# Patient Record
Sex: Male | Born: 1950 | Race: White | Hispanic: No | Marital: Married | Smoking: Never smoker
Health system: Southern US, Community
[De-identification: ages and names within clinical notes are randomized; demographics above are authoritative.]

## PROBLEM LIST (undated history)

## (undated) DIAGNOSIS — Z87442 Personal history of urinary calculi: Secondary | ICD-10-CM

## (undated) DIAGNOSIS — T7840XA Allergy, unspecified, initial encounter: Secondary | ICD-10-CM

## (undated) DIAGNOSIS — H269 Unspecified cataract: Secondary | ICD-10-CM

## (undated) DIAGNOSIS — K5730 Diverticulosis of large intestine without perforation or abscess without bleeding: Secondary | ICD-10-CM

## (undated) DIAGNOSIS — J309 Allergic rhinitis, unspecified: Secondary | ICD-10-CM

## (undated) DIAGNOSIS — R011 Cardiac murmur, unspecified: Secondary | ICD-10-CM

## (undated) DIAGNOSIS — M1990 Unspecified osteoarthritis, unspecified site: Secondary | ICD-10-CM

## (undated) DIAGNOSIS — I10 Essential (primary) hypertension: Secondary | ICD-10-CM

## (undated) DIAGNOSIS — B1920 Unspecified viral hepatitis C without hepatic coma: Secondary | ICD-10-CM

## (undated) DIAGNOSIS — Z9289 Personal history of other medical treatment: Secondary | ICD-10-CM

## (undated) HISTORY — DX: Diverticulosis of large intestine without perforation or abscess without bleeding: K57.30

## (undated) HISTORY — DX: Unspecified viral hepatitis C without hepatic coma: B19.20

## (undated) HISTORY — DX: Allergic rhinitis, unspecified: J30.9

## (undated) HISTORY — DX: Unspecified cataract: H26.9

## (undated) HISTORY — PX: ASD REPAIR: SHX258

## (undated) HISTORY — DX: Personal history of other medical treatment: Z92.89

## (undated) HISTORY — DX: Cardiac murmur, unspecified: R01.1

## (undated) HISTORY — DX: Essential (primary) hypertension: I10

## (undated) HISTORY — PX: POLYPECTOMY: SHX149

## (undated) HISTORY — PX: CARDIAC CATHETERIZATION: SHX172

## (undated) HISTORY — DX: Allergy, unspecified, initial encounter: T78.40XA

## (undated) HISTORY — PX: OTHER SURGICAL HISTORY: SHX169

---

## 1969-07-28 HISTORY — PX: ASD REPAIR: SHX258

## 2000-06-02 ENCOUNTER — Ambulatory Visit (HOSPITAL_COMMUNITY): Admission: RE | Admit: 2000-06-02 | Discharge: 2000-06-02 | Payer: Self-pay | Admitting: Gastroenterology

## 2000-06-02 ENCOUNTER — Encounter (INDEPENDENT_AMBULATORY_CARE_PROVIDER_SITE_OTHER): Payer: Self-pay

## 2002-07-16 ENCOUNTER — Encounter: Payer: Self-pay | Admitting: Emergency Medicine

## 2002-07-16 ENCOUNTER — Inpatient Hospital Stay (HOSPITAL_COMMUNITY): Admission: EM | Admit: 2002-07-16 | Discharge: 2002-07-20 | Payer: Self-pay | Admitting: Emergency Medicine

## 2002-07-29 ENCOUNTER — Inpatient Hospital Stay (HOSPITAL_COMMUNITY): Admission: AD | Admit: 2002-07-29 | Discharge: 2002-08-09 | Payer: Self-pay | Admitting: General Surgery

## 2002-07-30 ENCOUNTER — Encounter: Payer: Self-pay | Admitting: General Surgery

## 2002-08-03 ENCOUNTER — Encounter (INDEPENDENT_AMBULATORY_CARE_PROVIDER_SITE_OTHER): Payer: Self-pay | Admitting: *Deleted

## 2002-08-06 ENCOUNTER — Encounter: Payer: Self-pay | Admitting: General Surgery

## 2003-07-29 HISTORY — PX: HERNIA REPAIR: SHX51

## 2003-07-29 HISTORY — PX: ABDOMINAL HERNIA REPAIR: SHX539

## 2003-08-23 ENCOUNTER — Observation Stay (HOSPITAL_COMMUNITY): Admission: RE | Admit: 2003-08-23 | Discharge: 2003-08-24 | Payer: Self-pay | Admitting: General Surgery

## 2004-07-09 ENCOUNTER — Encounter: Admission: RE | Admit: 2004-07-09 | Discharge: 2004-07-09 | Payer: Self-pay | Admitting: General Surgery

## 2004-10-28 ENCOUNTER — Ambulatory Visit (HOSPITAL_COMMUNITY): Admission: RE | Admit: 2004-10-28 | Discharge: 2004-10-28 | Payer: Self-pay | Admitting: Gastroenterology

## 2004-10-28 ENCOUNTER — Encounter (INDEPENDENT_AMBULATORY_CARE_PROVIDER_SITE_OTHER): Payer: Self-pay | Admitting: *Deleted

## 2005-07-28 HISTORY — PX: COLON SURGERY: SHX602

## 2006-10-12 ENCOUNTER — Encounter: Payer: Self-pay | Admitting: Family Medicine

## 2007-03-04 ENCOUNTER — Ambulatory Visit: Payer: Self-pay | Admitting: Gastroenterology

## 2007-04-30 ENCOUNTER — Ambulatory Visit (HOSPITAL_COMMUNITY): Admission: RE | Admit: 2007-04-30 | Discharge: 2007-04-30 | Payer: Self-pay | Admitting: Gastroenterology

## 2007-04-30 ENCOUNTER — Encounter (INDEPENDENT_AMBULATORY_CARE_PROVIDER_SITE_OTHER): Payer: Self-pay | Admitting: Interventional Radiology

## 2008-10-23 ENCOUNTER — Ambulatory Visit: Payer: Self-pay | Admitting: Family Medicine

## 2008-10-23 DIAGNOSIS — I10 Essential (primary) hypertension: Secondary | ICD-10-CM

## 2008-10-23 DIAGNOSIS — J309 Allergic rhinitis, unspecified: Secondary | ICD-10-CM | POA: Insufficient documentation

## 2008-10-23 DIAGNOSIS — K5730 Diverticulosis of large intestine without perforation or abscess without bleeding: Secondary | ICD-10-CM | POA: Insufficient documentation

## 2008-10-23 DIAGNOSIS — Z860100 Personal history of colon polyps, unspecified: Secondary | ICD-10-CM | POA: Insufficient documentation

## 2008-10-23 HISTORY — DX: Essential (primary) hypertension: I10

## 2008-10-23 HISTORY — DX: Diverticulosis of large intestine without perforation or abscess without bleeding: K57.30

## 2008-10-23 HISTORY — DX: Allergic rhinitis, unspecified: J30.9

## 2008-11-20 ENCOUNTER — Ambulatory Visit: Payer: Self-pay | Admitting: Family Medicine

## 2009-01-23 ENCOUNTER — Ambulatory Visit: Payer: Self-pay | Admitting: Family Medicine

## 2009-12-11 ENCOUNTER — Telehealth: Payer: Self-pay | Admitting: Family Medicine

## 2009-12-13 ENCOUNTER — Ambulatory Visit: Payer: Self-pay | Admitting: Family Medicine

## 2010-01-07 ENCOUNTER — Ambulatory Visit: Payer: Self-pay | Admitting: Family Medicine

## 2010-01-07 DIAGNOSIS — L03119 Cellulitis of unspecified part of limb: Secondary | ICD-10-CM

## 2010-01-07 DIAGNOSIS — L02519 Cutaneous abscess of unspecified hand: Secondary | ICD-10-CM | POA: Insufficient documentation

## 2010-08-27 NOTE — Assessment & Plan Note (Signed)
Summary: med check//ccm   Vital Signs:  Patient profile:   60 year old male Temp:     97.7 degrees F oral BP sitting:   150 / 90  (left arm) Cuff size:   regular  Vitals Entered By: Sid Falcon LPN (Dec 13, 2009 8:30 AM)  Serial Vital Signs/Assessments:  Time      Position  BP       Pulse  Resp  Temp     By                     140/80                         Evelena Peat MD  CC: annual med review, Hypertension Management   History of Present Illness: Followup hypertension. Takes lisinopril HCTZ. Blood pressures have been well-controlled. No recent dizziness or orthostatic symptoms. No associated cough.  History hepatitis C followed at [facility]. He is apparently in remission. BPH followed by urologist. Leanora Ivanoff Flomax and symptoms stable. Needs complete physical examination.  Hypertension History:      He denies headache, chest pain, palpitations, dyspnea with exertion, orthopnea, PND, peripheral edema, visual symptoms, neurologic problems, syncope, and side effects from treatment.  He notes no problems with any antihypertensive medication side effects.        Positive major cardiovascular risk factors include male age 28 years old or older and hypertension.  Negative major cardiovascular risk factors include non-tobacco-user status.     Allergies (verified): No Known Drug Allergies  Past History:  Past Medical History: Last updated: 10/23/2008 Colonic polyps, hx of Diverticulosis, colon Hypertension Hay Fever/Allergies Heart Murmur hepatiits C  Past Surgical History: Last updated: 10/23/2008 Atrial Septal Defect repair 1971 Hernia repair 1973 Colon Resection 2007 (diverticulitis rupture) Colon resection hernia repair 2005  Family History: Last updated: 10/23/2008 Family History of Alcoholism/Addiction  parent Family History of Cardiovascular disorder  parent  Social History: Last updated: 10/23/2008 Occupation:  Surveyor, minerals Married Never Smoked  Risk  Factors: Smoking Status: never (10/23/2008) PMH-FH-SH reviewed for relevance  Review of Systems      See HPI  Physical Exam  General:  Well-developed,well-nourished,in no acute distress; alert,appropriate and cooperative throughout examination Mouth:  Oral mucosa and oropharynx without lesions or exudates.  Teeth in good repair. Lungs:  Normal respiratory effort, chest expands symmetrically. Lungs are clear to auscultation, no crackles or wheezes. Heart:  Normal rate and regular rhythm. S1 and S2 normal without gallop, murmur, click, rub or other extra sounds. Extremities:  No clubbing, cyanosis, edema, or deformity noted with normal full range of motion of all joints.     Impression & Recommendations:  Problem # 1:  HYPERTENSION (ICD-401.9) Assessment Unchanged  His updated medication list for this problem includes:    Lisinopril-hydrochlorothiazide 10-12.5 Mg Tabs (Lisinopril-hydrochlorothiazide) ..... One by mouth once daily  Complete Medication List: 1)  Flomax 0.4 Mg Xr24h-cap (Tamsulosin hcl) .... Once daily 2)  Fexofenadine Hcl 180 Mg Tabs (Fexofenadine hcl) .... Once daily as needed 3)  Lisinopril-hydrochlorothiazide 10-12.5 Mg Tabs (Lisinopril-hydrochlorothiazide) .... One by mouth once daily 4)  Fish Oil Oil (Fish oil) .... Once daily 5)  Multi Vitamin Mens Tabs (Multiple vitamin) .... Once daily  Hypertension Assessment/Plan:      The patient's hypertensive risk group is category B: At least one risk factor (excluding diabetes) with no target organ damage.  Today's blood pressure is 150/90.    Patient Instructions: 1)  Schedule complete physical examination Prescriptions: LISINOPRIL-HYDROCHLOROTHIAZIDE 10-12.5 MG TABS (LISINOPRIL-HYDROCHLOROTHIAZIDE) one by mouth once daily  #90 x 3   Entered and Authorized by:   Evelena Peat MD   Signed by:   Evelena Peat MD on 12/13/2009   Method used:   Electronically to        [facility]* (mail-order)              ,          Ph: [phone]       Fax: [phone]   RxID:   [id number]

## 2010-08-27 NOTE — Assessment & Plan Note (Signed)
Summary: ?hand inf/njr   Vital Signs:  Patient profile:   60 year old male Temp:     98.0 degrees F oral BP sitting:   130 / 82  (left arm) Cuff size:   regular  Vitals Entered By: Sid Falcon LPN (January 07, 2010 4:30 PM) CC: Left hand injury 6 days ago   History of Present Illness: Last Wed hit dorsum L hand with hard plastic from plant sprayer. Tetanus up to date. Redness noted this AM.  Progressive through the day. No fever of chills.  Increased swelling past couple of days.  Allergies (verified): No Known Drug Allergies  Past History:  Past Medical History: Last updated: 10/23/2008 Colonic polyps, hx of Diverticulosis, colon Hypertension Hay Fever/Allergies Heart Murmur hepatiits C  Review of Systems      See HPI  Physical Exam  General:  Well-developed,well-nourished,in no acute distress; alert,appropriate and cooperative throughout examination Lungs:  Normal respiratory effort, chest expands symmetrically. Lungs are clear to auscultation, no crackles or wheezes. Heart:  normal rate and regular rhythm.   Extremities:  L hand mod edema.  Abrasion dorsum of hand with surrounding erythema dorsum hand and extending for distal wrist.   Impression & Recommendations:  Problem # 1:  CELLULITIS, HAND, LEFT (ICD-682.4) Assessment New elevation, heating pad and start antibiotics. His updated medication list for this problem includes:    Cephalexin 500 Mg Caps (Cephalexin) ..... One by mouth three times a day for 10 days  Complete Medication List: 1)  Flomax 0.4 Mg Xr24h-cap (Tamsulosin hcl) .... Once daily 2)  Fexofenadine Hcl 180 Mg Tabs (Fexofenadine hcl) .... Once daily as needed 3)  Lisinopril-hydrochlorothiazide 10-12.5 Mg Tabs (Lisinopril-hydrochlorothiazide) .... One by mouth once daily 4)  Fish Oil Oil (Fish oil) .... Once daily 5)  Multi Vitamin Mens Tabs (Multiple vitamin) .... Once daily 6)  Cephalexin 500 Mg Caps (Cephalexin) .... One by mouth three  times a day for 10 days  Patient Instructions: 1)  Elevate hand frequently. 2)  use heating pad 3-4 times daily. 3)  Follow up promptly for any fever or increasing redness or swelling. Prescriptions: CEPHALEXIN 500 MG CAPS (CEPHALEXIN) one by mouth three times a day for 10 days  #30 x 0   Entered and Authorized by:   Evelena Peat MD   Signed by:   Evelena Peat MD on 01/07/2010   Method used:   Electronically to        [facility] [phone]* (retail)       [address]       [place], [place]  [address]       Ph: [phone]       Fax: [phone]   RxID:   [id number]

## 2010-08-27 NOTE — Progress Notes (Signed)
Summary: REFILL REQUEST (Lisinopril / HCTZ)  Phone Note Refill Request Message from:  Fax from Pharmacy on Dec 11, 2009 8:32 AM  Refills Requested: Medication #1:  LISINOPRIL-HYDROCHLOROTHIAZIDE 10-12.5 MG TABS one by mouth once daily   Notes: [facility]..... Quantity 90, Refills 3.....Marland KitchenMarland KitchenFax # [phone]    Initial call taken by: Debbra Riding,  Dec 11, 2009 8:33 AM    Prescriptions: LISINOPRIL-HYDROCHLOROTHIAZIDE 10-12.5 MG TABS (LISINOPRIL-HYDROCHLOROTHIAZIDE) one by mouth once daily  #90 x 0   Entered by:   Sid Falcon LPN   Authorized by:   Evelena Peat MD   Signed by:   Sid Falcon LPN on 47/82/9562   Method used:   Electronically to        [facility]* (mail-order)             ,          Ph: [phone]       Fax: [phone]   RxID:   [id number]

## 2010-10-30 ENCOUNTER — Other Ambulatory Visit (INDEPENDENT_AMBULATORY_CARE_PROVIDER_SITE_OTHER): Payer: Managed Care, Other (non HMO) | Admitting: Family Medicine

## 2010-10-30 DIAGNOSIS — Z0000 Encounter for general adult medical examination without abnormal findings: Secondary | ICD-10-CM

## 2010-10-30 LAB — BASIC METABOLIC PANEL
CO2: 28 mEq/L (ref 19–32)
Calcium: 9.8 mg/dL (ref 8.4–10.5)
Chloride: 103 mEq/L (ref 96–112)
GFR: 65.5 mL/min (ref >60.00)
Potassium: 3.9 mEq/L (ref 3.5–5.1)
Sodium: 141 mEq/L (ref 135–145)

## 2010-10-30 LAB — CBC WITH DIFFERENTIAL/PLATELET
Basophils Relative: 0.5 % (ref 0.0–3.0)
Eosinophils Absolute: 0.2 10*3/uL (ref 0.0–0.7)
Eosinophils Relative: 3.6 % (ref 0.0–5.0)
Hemoglobin: 16.6 g/dL (ref 13.0–17.0)
Lymphs Abs: 1.2 10*3/uL (ref 0.7–4.0)
MCV: 87.8 fl (ref 78.0–100.0)
Monocytes Absolute: 0.5 10*3/uL (ref 0.1–1.0)
Monocytes Relative: 8.6 % (ref 3.0–12.0)
Neutro Abs: 3.5 10*3/uL (ref 1.4–7.7)
RDW: 14.4 % (ref 11.5–14.6)

## 2010-10-30 LAB — POCT URINALYSIS DIPSTICK
Ketones, UA: NEGATIVE
Leukocytes, UA: NEGATIVE
Spec Grav, UA: 1.025
Urobilinogen, UA: 0.2

## 2010-10-30 LAB — LIPID PANEL
LDL Cholesterol: 84 mg/dL (ref 0–99)
Total CHOL/HDL Ratio: 5
Triglycerides: 77 mg/dL (ref 0.0–149.0)

## 2010-10-30 LAB — TSH: TSH: 1.91 u[IU]/mL (ref 0.35–5.50)

## 2010-10-30 LAB — HEPATIC FUNCTION PANEL
ALT: 18 U/L (ref 0–53)
AST: 15 U/L (ref 0–37)
Albumin: 4.6 g/dL (ref 3.5–5.2)
Bilirubin, Direct: 0.2 mg/dL (ref 0.0–0.3)
Total Bilirubin: 0.9 mg/dL (ref 0.3–1.2)

## 2010-11-05 ENCOUNTER — Encounter: Payer: Self-pay | Admitting: Family Medicine

## 2010-11-06 ENCOUNTER — Ambulatory Visit (INDEPENDENT_AMBULATORY_CARE_PROVIDER_SITE_OTHER): Payer: Managed Care, Other (non HMO) | Admitting: Family Medicine

## 2010-11-06 ENCOUNTER — Encounter: Payer: Self-pay | Admitting: Family Medicine

## 2010-11-06 VITALS — BP 140/84 | HR 72 | Temp 97.7°F | Resp 12 | Ht 72.0 in | Wt 194.0 lb

## 2010-11-06 DIAGNOSIS — Z0000 Encounter for general adult medical examination without abnormal findings: Secondary | ICD-10-CM

## 2010-11-06 DIAGNOSIS — B1920 Unspecified viral hepatitis C without hepatic coma: Secondary | ICD-10-CM | POA: Insufficient documentation

## 2010-11-06 DIAGNOSIS — J309 Allergic rhinitis, unspecified: Secondary | ICD-10-CM

## 2010-11-06 MED ORDER — FLUTICASONE PROPIONATE 50 MCG/ACT NA SUSP: 2.0000 | Freq: Every day | NASAL | Status: DC

## 2010-11-06 NOTE — Progress Notes (Signed)
  Subjective:    Patient ID: Jermaine Brady, male    DOB: 02-07-1951, 60 y.o.   MRN: [id number]  HPI Here for annual physical exam.  He has history of hypertension, seasonal allergic rhinitis, diverticulosis of the colon, BPH, and prior history hepatitis C. Was briefly followed hepatitis C clinic. No recent signs or symptoms of hepatic dysfunction.  Tetanus 2006. Colonoscopy 2006 normal.   Review of Systems  Constitutional: Negative for fever, activity change, appetite change and fatigue.  HENT: Negative for ear pain, congestion and trouble swallowing.   Eyes: Negative for pain and visual disturbance.  Respiratory: Negative for cough, shortness of breath and wheezing.   Cardiovascular: Negative for chest pain and palpitations.  Gastrointestinal: Negative for nausea, vomiting, abdominal pain, diarrhea, constipation, blood in stool, abdominal distention and rectal pain.  Genitourinary: Negative for dysuria, hematuria and testicular pain.  Musculoskeletal: Negative for joint swelling and arthralgias.  Skin: Negative for rash.  Neurological: Negative for dizziness, syncope and headaches.  Hematological: Negative for adenopathy.  Psychiatric/Behavioral: Negative for confusion and dysphoric mood.       Objective:   Physical Exam  Constitutional: He is oriented to person, place, and time. He appears well-developed and well-nourished. No distress.  HENT:  Head: Normocephalic and atraumatic.  Right Ear: External ear normal.  Left Ear: External ear normal.  Mouth/Throat: Oropharynx is clear and moist.  Eyes: Conjunctivae and EOM are normal. Pupils are equal, round, and reactive to light.  Neck: Normal range of motion. Neck supple. No thyromegaly present.  Cardiovascular: Normal rate, regular rhythm and normal heart sounds.   No murmur heard. Pulmonary/Chest: No respiratory distress. He has no wheezes. He has no rales.  Abdominal: Soft. Bowel sounds are normal. He exhibits no distension  and no mass. There is no tenderness. There is no rebound and no guarding.  Genitourinary:       Per urology and due for appointment in one month  Musculoskeletal: He exhibits no edema.  Lymphadenopathy:    He has no cervical adenopathy.  Neurological: He is alert and oriented to person, place, and time. He displays normal reflexes. No cranial nerve deficit.  Skin: No rash noted.  Psychiatric: He has a normal mood and affect.          Assessment & Plan:  Complete physical. Immunizations up to date. Hemoccults given. Discussed exercise and appropriate diet.  Labs reviewed with patient and all stable with exception of low HDL. Liver transaminases stable. Forms completed for Department of Transportation exam.

## 2010-12-10 ENCOUNTER — Ambulatory Visit: Payer: Self-pay | Admitting: Family Medicine

## 2010-12-13 NOTE — Op Note (Signed)
NAME:  Jermaine Brady, Jermaine Brady NO.:  [id number]   MEDICAL RECORD NO.:  [id number]                   PATIENT TYPE:  AMB   LOCATION:  DAY                                  FACILITY:  [facility]   PHYSICIAN:  [name], M.D.          DATE OF BIRTH:  02/12/1951   DATE OF PROCEDURE:  08/23/2003  DATE OF DISCHARGE:                                 OPERATIVE REPORT   PREOPERATIVE DIAGNOSIS:  Incisional hernia.   POSTOPERATIVE DIAGNOSIS:  Incisional hernia.   OPERATION/PROCEDURE:  Laparoscopic repair of incisional hernia.   SURGEON:  [name], M.D.   ANESTHESIA:  General.   BRIEF HISTORY:  [name] is a 60 year old white male, status post sigmoid  colectomy approximately one year ago for perforated diverticulitis.  He now  presents with an enlarging symptomatic incisional hernia presenting at the  umbilicus.  Options for management have been discussed.  We have elected to  proceed with laparoscopic repair.  The nature of the procedure, indications,  risks of bleeding, infection, recurrence, and possible need for open  procedure were discussed and understood.  He is now brought to the operating  room for this procedure.   DESCRIPTION OF PROCEDURE:  The patient was brought to the operating room and  placed in the supine position on the operating table.  General endotracheal  anesthesia was induced.  Foley catheter was placed.  The abdomen was widely  sterilely prepped and draped.  Local anesthesia was used to infiltrate the  trocar sites prior to the incisions.  A 1 cm incision was made in the right  mid abdomen and access was obtained with an 11 mm Optiview trocar without  difficulty.  Pneumoperitoneum established.  Under direct vision, a 5 mm  trocar was replaced in the right upper quadrant and right lower quadrant.  There were fairly extensive omental adhesions to the anterior abdominal wall  and midline incision.  They were all carefully  taken down sharply and with  Harmonic scalpel under direct vision.  There were no significant bowel  adhesions.  The entire anterior abdominal wall was clear.  At this point a  fairly discrete defect was able to be defined around the umbilicus at the  incision measuring about 3 cm in maximum diameters.  To allow wide coverage  and further coverage of the midline incision more inferiorly, although there  were no discrete hernias here, a 10 x 15 cm piece of Parietex composite mesh  was used.  Four sutures were placed, stay sutures with 0 Prolene.  The mesh  was moistened and coiled and placed in the abdominal cavity and oriented  through four corresponding small stab incisions.  The sutures were grasped  with an __________ and brought up through the anterior abdominal wall and  secured, provided nice broad coverage of the hernia defect and mesh was well  deployed.  An additional 5 mm trocar was placed in the  left mid abdomen to  allow a good angle for placement of tacks and the mesh was then tacked  circumferentially with the Protacker.  Operative site was inspected.  Hemostasis appeared complete.  All CO2 was evacuated and trocars were  removed.  Skin incisions were closed with interrupted subcuticular 4-0  Monocryl and Steri-Strips.  Sponge, needle and instrument counts were  correct.  Dry dressing was applied and the patient taken to the recovery  room in good condition.                                              [name], M.D.   [name]  D:  08/23/2003  T:  08/23/2003  Job:  [id number]

## 2010-12-13 NOTE — Discharge Summary (Signed)
NAME:  DECKER, COGDELL NO.:  [id number]   MEDICAL RECORD NO.:  [id number]                   PATIENT TYPE:  INP   LOCATION:  5509                                 FACILITY:  [facility]   PHYSICIAN:  [name], M.D.          DATE OF BIRTH:  [birth year]   DATE OF ADMISSION:  07/29/2002  DATE OF DISCHARGE:  08/09/2002                                 DISCHARGE SUMMARY   DISCHARGE DIAGNOSIS:  Perforated diverticulitis with abscess.   SURGICAL PROCEDURE:  Sigmoid colectomy with anastomosis on August 03, 2002.   HISTORY OF PRESENT ILLNESS:  The patient is a 60 year old white male who was  discharged on July 20, 2002, from [facility] after a 5 day  hospitalization for acute left lower quadrant abdominal pain and a CT scan  revealing sigmoid diverticulitis and also apparently a tiny amount of free  air in the peritoneal cavity as well. He was  treated with bowel rest,  intravenous antibiotics and clinically markedly improved and his white count  returned to normal. He had been home on p.o. Augmentin for 2 to 3 days prior  to  this admission. He has had increased sweats and fever at night up to  100.5. He has had some persistent mild left lower quadrant discomfort that  has gotten a little bit worse. His bowels have been moving regularly. No  nausea or vomiting. He discussed this with Dr. Orson Slick on July 27, 2002,  and a CBC was obtained as an outpatient which showed increased white count  to 18,000, and he presented to see me in the office today for evaluation.   PAST MEDICAL HISTORY:  1. He had an atrial septal defect repaired 20 years ago.  2. He has had a left inguinal hernia repair .  3. Otherwise no significant medical or surgical illnesses.   MEDICATIONS:  Augmentin.   SOCIAL HISTORY, FAMILY HISTORY, REVIEW OF SYSTEMS:  See history and  physical.   PHYSICAL EXAMINATION:  VITAL SIGNS:  Afebrile, vital signs all within normal  limits.  GENERAL:  A well developed white male in no acute distress.  ABDOMEN:  Pertinent findings were limited to the abdomen which was generally  soft and nontender but there is some localized significant tenderness in the  left lower quadrant and a good sized palpable mass as well.   HOSPITAL COURSE:  With these findings, the patient was admitted to [facility] for further treatment. The patient was begun on IV antibiotics  and bowel rest. On July 30, 2002, his white count was decreased to 8.9  thousand. A CT scan of the abdomen was obtained on July 30, 2002, which  revealed a large gas collection with some fluid adjacent to the sigmoid  colon against the anterior abdominal wall consistent with a contained large  perforation. The gas collection could be traced into the sigmoid colon  through an apparent large defect  in the bowel wall.   With this apparent good sized perforation and localized gas collection or  abscess, resection was recommended and accepted. The patient underwent a  gentle mechanical bowel prep over the next couple of days which he tolerated  well. He was taken to the operating room on August 03, 2002, and underwent a  sigmoid colectomy with primary anastomosis. Findings were a limited area of  severe diverticulitis with a walled off perforation.   He was maintained on antibiotics postoperatively. He was begun on a clear  liquid diet on postoperative day #1. He developed some urinary retention  requiring placement of a Foley catheter. He had an episode of fairly severe  wrist pain that lasted less than 1 day without any physical findings and  this did not recur.   By postoperative day #4, his abdominal pain was better but he did have some  nausea and vomited twice. This was felt secondary to ileus and he was  maintained on just sips of liquids.   By postoperative day #5 he was  feeling significantly better. His nausea had  resolved. He had begun  passing some flatus. His Foley catheter was  discontinued at this point and he was  able to void. His diet was able to be  advanced.   By postoperative day #6 he was  tolerating full liquids without difficulty  and had several small bowel movements. His wound healed primarily and  staples were removed. He was discharged to home at this time. Final  pathology revealed acute diverticulitis with evidence of rupture.   DISCHARGE MEDICATIONS:  1. Tylox for pain.  2. Oral antibiotics to follow up another 5 days.                                                 [name], M.D.    [name]  D:  08/30/2002  T:  08/31/2002  Job:  [id number]

## 2010-12-13 NOTE — H&P (Signed)
NAME:  Jermaine Brady NO.:  [id number]   MEDICAL RECORD NO.:  [id number]                   PATIENT TYPE:  INP   LOCATION:  5509                                 FACILITY:  [facility]   PHYSICIAN:  [name], M.D.          DATE OF BIRTH:  [birth year]   DATE OF ADMISSION:  07/29/2002  DATE OF DISCHARGE:                                HISTORY & PHYSICAL   CHIEF COMPLAINT:  Abdominal pain and fever.   HISTORY OF PRESENT ILLNESS:  Jermaine Brady is a 60 year old white male,  discharged on 07/20/2002 from [facility] after a 5 day  hospitalization for acute left lower quadrant abdominal pain.  CT scan at  that admission revealed evidence of sigmoid diverticulitis and apparently a  small amount of free air in the peritoneal cavity as well.  He was treated  with bowel rest and intravenous antibiotics, and clinically markedly  improved.  The white count returned to normal.  He has been at home on  Augmentin.  Over the past several days he has had sweats and fever at night  up to 100.5.  He has had some persistent mild, left lower quadrant  discomfort that may be a little worse over the last couple of days.  Bowels  have been moving regularly.  No nausea or vomiting.  He discussed this with  Dr. Orson Slick on 07/27/2002 and a CBC was obtained which has returned showing  an elevated white count of 18,000 and he presented to the office today for  evaluation.   PAST MEDICAL HISTORY:  He had an atrial septal defect repaired about 20  years ago.  He has had a left inguinal hernia repair.  Otherwise, no  significant illnesses, except as above.   MEDICATIONS:  Only current medication is Augmentin.   SOCIAL HISTORY:  He is a Corporate investment banker.  He is married.  He does not  smoke cigarettes or drink alcohol.   PHYSICAL EXAMINATION:  VITAL SIGNS:  Temperature in the office today is  97.8.  Vital signs all within normal limits.  GENERAL:  He is a  well-developed, white male, who does not appear in any  acute distress.  SKIN:  Warm and dry.  HEENT:  No scleral icterus.  LUNGS:  Clear to auscultation.  CARDIAC:  Regular rate and rhythm without murmurs.  ABDOMEN:  Active bowel sounds.  Abdomen is generally soft and nontender, but  there is localized tenderness in the left lower quadrant and also a good  sized palpable mass.  Some guarding.  EXTREMITIES:  No edema or deformity.   LABORATORY DATA:  White count elevated at 18,000 as above.   ASSESSMENT AND PLAN:  Recent episode of sigmoid diverticulitis with evidence  of perforation.  He initially responded well to antibiotics, but has  increasing discomfort and fever as an outpatient, and now on exam has a  palpable mass in the left lower  quadrant and significantly elevated white  blood count.  I am going to admit the patient for bowel rest and intravenous  antibiotics.  Will obtain an early CT scan of the abdomen and pelvis, with  suspicion of possible abdominal abscess.                                               [name], M.D.    [name]  D:  07/29/2002  T:  07/29/2002  Job:  [id number]

## 2010-12-13 NOTE — Discharge Summary (Signed)
   NAME:  Jermaine Brady, FITZPATRICK NO.:  [id number]   MEDICAL RECORD NO.:  [id number]                   PATIENT TYPE:  INP   LOCATION:  0443                                 FACILITY:  [facility]   PHYSICIAN:  Lorre Munroe., M.D.            DATE OF BIRTH:  Nov 02, 1950   DATE OF ADMISSION:  07/16/2002  DATE OF DISCHARGE:  07/20/2002                                 DISCHARGE SUMMARY   HISTORY OF PRESENT ILLNESS:  The patient is a 60 year old white male who was  admitted to the hospital because of left-sided abdominal pain.  CT scan  demonstrated findings consistent with acute diverticulitis of the proximal  sigmoid colon.  A small pneumoperitoneum was noted, but the inflammation  appeared to be well localized in the left lower quadrant.  There is no  previous history of diverticulitis or other gastrointestinal problems.  His  general health is excellent.   PHYSICAL EXAMINATION:  There was marked tenderness in the left lower  quadrant of the abdomen with mild diffuse abdominal tenderness.   HOSPITAL COURSE:  The patient was started on broad-spectrum intravenous  antibiotics, and they were continued throughout the course of the  hospitalization.  He rapidly became afebrile and felt better.  His white  count gradually improved to normal.  His abdominal tenderness decreased.  On  July 20, 2002, he was afebrile, and white count was 7300.  I discharged  him on Augmentin 875 mg three times a day.  He is to follow up in the office  with me in two to three weeks or if he gets worse.   DIAGNOSIS:  Acute diverticulitis.   CONDITION ON DISCHARGE:  Improved.                                                Lorre Munroe., M.D.    WB/MEDQ  D:  08/09/2002  T:  08/09/2002  Job:  [id number]

## 2010-12-13 NOTE — Op Note (Signed)
NAME:  Jermaine Brady, Jermaine Brady NO.:  [id number]   MEDICAL RECORD NO.:  [id number]          PATIENT TYPE:  AMB   LOCATION:  ENDO                         FACILITY:  [facility]   PHYSICIAN:  Bernette Redbird, M.D.   DATE OF BIRTH:  10/15/1950   DATE OF PROCEDURE:  10/28/2004  DATE OF DISCHARGE:                                 OPERATIVE REPORT   PROCEDURE:  Colonoscopy with biopsies.   ENDOSCOPIST:  Bernette Redbird, M.D.   INDICATION:  This is a 60 year old gentleman for his initial colon cancer  screening examination.  He has no worrisome risk factors or symptoms.  He  was set up as an open access patient.  He is status post a previous partial  colectomy for complicated diverticulitis.   FINDINGS:  Diminutive proximal colonic polyp with mild residual  diverticulosis.   DESCRIPTION OF PROCEDURE:  The nature, purpose, and risks of the procedure  had been discussed with the patient at the office by our nurse, and he had  watched our video tape, and I also reviewed the risks with him immediately  prior to the procedure, and he was agreeable to proceed.  Sedation was  fentanyl 75 mcg and Versed 6 mg IV without arrhythmias or desaturation.  Examination of the prostate was unremarkable.  The Olympus adult video  colonoscope was advanced with some looping overcome by external abdominal  compression, reaching the terminal ileum which had a normal appearance, and  pullback was then performed. Just above the cecum was a 3-mm sessile polyp  biopsied several times.  No other polyps were seen, and there was no  evidence of cancer, colitis, or vascular malformations.  There was mild  residual diverticulosis. The patient's colocolostomy anastomosis was located  at about 20 cm and was widely patent. Retroflexion in the rectum and  reinspection of the rectum were unremarkable.  The quality of prep on this  exam was very good, and it is felt that all areas were well seen   The patient tolerated  the procedure well, and there no apparent  complications.   IMPRESSION:  1.  Solitary diminutive polyp removed as described above (211.3).  2.  Mild residual diverticulosis.   PLAN:  Await pathology on the polyp with consideration for repeat  colonoscopy in five years if the polyps is adenomatous in character.      RB/MEDQ  D:  10/28/2004  T:  10/28/2004  Job:  [id number]   cc:   Teena Irani. Arlyce Dice, M.D.  [id number]  [place]  [place] [address]  Fax: [phone]

## 2010-12-13 NOTE — Op Note (Signed)
NAME:  Jermaine Brady, Jermaine Brady NO.:  [id number]   MEDICAL RECORD NO.:  [id number]                   PATIENT TYPE:  INP   LOCATION:  5509                                 FACILITY:  [facility]   PHYSICIAN:  [name], M.D.          DATE OF BIRTH:  [birth year]   DATE OF PROCEDURE:  08/03/2002  DATE OF DISCHARGE:                                 OPERATIVE REPORT   PREOPERATIVE DIAGNOSIS:  Sigmoid colon diverticulitis with contained  perforation.   POSTOPERATIVE DIAGNOSIS:  Sigmoid colon diverticulitis with contained  perforation.   OPERATION PERFORMED:  Sigmoid colectomy.   SURGEON:  [name], M.D.   ASSISTANT:  Vikki Ports, M.D.   ANESTHESIA:  General.   INDICATIONS FOR PROCEDURE:  The patient is a 60 year old white male who  recently presented with acute diverticulitis that initially improved on  antibiotic therapy.  He however, returned with some increasing discomfort  and CT scan has revealed a contained perforation of the midsigmoid colon  with a large gas collection walled off against the anterior abdominal wall.  We have been able to treat him with intravenous antibiotics and gentle bowel  prep without difficulty and he is now being brought to the operating room  for resection of this area.  The nature of the procedure, its indications  and risks of bleeding, infection, anastomotic leak were discussed and  understood.  He is now brought to the operating room for this procedure.   DESCRIPTION OF PROCEDURE:  The patient was brought to the operating room and  placed in supine position on the operating table and general endotracheal  anesthesia was induced.  He was already on broad spectrum antibiotics.  A  Foley catheter was inserted.  The abdomen was widely sterilely prepped and  draped.  A low midline incision skirting the umbilicus was used and  dissection carried to the subcutaneous tissue in midline fashion using  cautery and the peritoneum entered under direct vision.  There was a good  size inflammatory mass adherent to the anterior abdominal wall in the left  lower quadrant.  With careful blunt dissection, this was dissected off the  anterior abdominal wall and a large air-filled cavity with some purulent  material was entered.  This was cultured and suctioned.  This was further  bluntly dissected off the anterior and lateral pelvic wall until the sigmoid  colon was mobilized away from its inflammatory attachments.  There was a  good sized cavity formed by the anterior abdominal wall and omentum and  sigmoid colon.  The inflammatory omental attachments to the colon were  divided between clamps and tied with 2-0 silk ties completely immobilizing  the omentum.  The colon proximally and distally was carefully inspected and  appeared normal without marked thickening or inflammatory change.  This was  a segment fairly well localized in the mid to proximal sigmoid colon.  The  left colon was  mobilized dividing the peritoneum along the line of Toldt  laterally.  The distal sigmoid colon was also mobilized dividing lateral  peritoneal attachments.  The ureter was identified and carefully protected.  Points for proximal and distal resection were chosen in normal-appearing  bowel that would allow anastomosis with no tension.  I did not see any  evidence of thickening or diverticulosis beyond this area.  The mesentery  and pericolic fat at the proximal resection site was cleaned from the bowel  and this was divided between a Kocher and Allen clamp.  The mesentery of the  involved segment was then sequentially divided between clamps and tied with  2-0 silk ties, suturing ligating larger vessels.  The dissection progressed  distally to the proposed distal point of resection which was cleaned of  appendices epiploica, pericolic fat and mesentery and this was controlled  with 2-0 silk stay sutures and then  divided and the specimen removed.  The  bowel came together very nicely under no tension and appeared quite healthy.  An end-to-end anastomosis was created with full thickness interrupted 2-0  silk sutures.  This appeared widely patent and under no tension with good  blood supply.  Following this, gloves, instruments and lap pads were  changed.  A routine exploration of the remainder of the abdomen was  unremarkable.  The abdomen was thoroughly irrigated with warm saline and  hemostasis assured.  The viscera were returned to anatomic position.  The  midline fascia was closed with running #1 PDS beginning at either end of the  incision and tied centrally.  The subcutaneous tissue was irrigated with  antibiotic solution and the skin closed with staples.  Sponge, needle and  instrument counts were correct.  Dry sterile dressing was applied and the  patient taken to recovery in good position.                                                [name], M.D.    [name]  D:  08/03/2002  T:  08/03/2002  Job:  [id number]

## 2010-12-13 NOTE — H&P (Signed)
NAME:  Jermaine Brady, Jermaine Brady NO.:  [id number]   MEDICAL RECORD NO.:  [id number]                   PATIENT TYPE:  INP   LOCATION:  0443                                 FACILITY:  [facility]   PHYSICIAN:  Lorre Munroe., M.D.            DATE OF BIRTH:  1951/03/07   DATE OF ADMISSION:  07/16/2002  DATE OF DISCHARGE:                                HISTORY & PHYSICAL   CHIEF COMPLAINT:  Abdominal pain.   HISTORY OF PRESENT ILLNESS:  The patient is a 60 year old white male without  any history of GI problems who comes in with left sided abdominal pain. He  has undergone a CT scan which demonstrates findings of acute diverticulitis  of the proximal sigmoid colon. There is pneumoperitoneum noted. There was no  free fluid noted. The patient had vomited. He had felt fevers and had a  chill. The pain had been increasing but was localized to the left side of  the abdomen with occasional crampy pain as well. He is admitted to the  hospital for intravenous antibiotics and supportive care with a diagnosis of  acute diverticulitis.   PAST MEDICAL HISTORY:  He has no history of diverticular disease or any  other colon disease in the past. He is on no medications and has no known  medication allergies. He is a smoker. He does not drink alcoholic beverages.  He has hepatitis C which may have come from blood transfusion or priming of  the cardiac bypass pump for his previous surgery. He had an ASD repair in  the 1980s. He has had a hernia repair. No other major illnesses or  operations. The patient is active and works daily.   FAMILY HISTORY:  Childhood illnesses and review of systems are unremarkable.   PHYSICAL EXAMINATION:  GENERAL:  The patient is a generally healthy  appearing man. No acute distress and mental status normal.   VITAL SIGNS:  As recorded by nursing staff.   HEAD/NECK/EYES/EARS/NOSE/MOUTH/THROAT:  Unremarkable.   CHEST:  Clear to auscultation.  Median  sternotomy present.   HEART:  Rate and rhythm normal, no murmur or gallop.   ABDOMEN:  Marked left lower quadrant tenderness and rebound tenderness. The  remainder of the abdomen is soft and there is no significant tenderness.  Bowel sounds are active and normal.   GENITALIA:  Normal. Rectal not performed at this time.   EXTREMITIES:  Normal.   SKIN:  No lesions noted.   LYMPH NODES:  Not enlarged.   NEUROLOGIC:  Normal.   IMPRESSION:  Acute diverticulitis with pneumoperitoneum but localized  peritoneal findings.   RECOMMENDATIONS:  I am going to treat him medically and observe him very  carefully. He is aware that he eventually may have to have emergency surgery  with colostomy but we will try to avoid this. I will put him on IV Zosyn.  Lorre Munroe., M.D.    WB/MEDQ  D:  07/18/2002  T:  07/18/2002  Job:  [id number]

## 2011-03-17 ENCOUNTER — Other Ambulatory Visit: Payer: Self-pay | Admitting: Family Medicine

## 2011-05-08 LAB — CBC
MCHC: 34.4
Platelets: 133 — ABNORMAL LOW
RBC: 5.55

## 2011-05-08 LAB — PROTIME-INR: Prothrombin Time: 15.7 — ABNORMAL HIGH

## 2011-10-14 ENCOUNTER — Ambulatory Visit (INDEPENDENT_AMBULATORY_CARE_PROVIDER_SITE_OTHER): Payer: Managed Care, Other (non HMO) | Admitting: Family Medicine

## 2011-10-14 ENCOUNTER — Encounter: Payer: Self-pay | Admitting: Family Medicine

## 2011-10-14 VITALS — BP 138/78 | Temp 97.8°F | Wt 196.0 lb

## 2011-10-14 DIAGNOSIS — H6120 Impacted cerumen, unspecified ear: Secondary | ICD-10-CM

## 2011-10-14 MED ORDER — NEOMYCIN-POLYMYXIN-HC 3.5-10000-1 OT SUSP: 4.0000 [drp] | Freq: Four times a day (QID) | OTIC | Status: AC

## 2011-10-14 NOTE — Progress Notes (Signed)
  Subjective:    Patient ID: Jermaine Brady, male    DOB: 1951-03-30, 61 y.o.   MRN: [id number]  HPI   Patient here with fullness R ear for about one week.  Tried home irrigation and Q tip with some cerumen removal but still has some fullness.  No dizziness.  No drainage.  No fever.  No hearing loss.  Hypertension on lisinopril hctz and compliant with therapy.  No dizziness or headaches.  No chest pain.  Past Medical History  Diagnosis Date  . HYPERTENSION 10/23/2008  . ALLERGIC RHINITIS 10/23/2008  . DIVERTICULOSIS, COLON 10/23/2008  . Heart murmur   . Hepatitis C    Past Surgical History  Procedure Date  . Asd repair 1971  . Hernia repair 2005  . Colon surgery 2007    ,resection, diverticular rupture    reports that he has never smoked. He does not have any smokeless tobacco history on file. His alcohol and drug histories not on file. family history includes Alcohol abuse in his father and other and Heart disease in his other. No Known Allergies      Review of Systems  Constitutional: Negative for fatigue.  HENT: Positive for ear pain and tinnitus. Negative for ear discharge.   Eyes: Negative for visual disturbance.  Respiratory: Negative for cough, chest tightness and shortness of breath.   Cardiovascular: Negative for chest pain, palpitations and leg swelling.  Neurological: Negative for dizziness, syncope, weakness, light-headedness and headaches.       Objective:   Physical Exam  Constitutional: He appears well-developed and well-nourished.  HENT:       L canal clear and R impacted with cerumen with some blood ant canal (minimal) ? From Q tip use.  Cardiovascular: Normal rate and regular rhythm.   Pulmonary/Chest: Effort normal and breath sounds normal. No respiratory distress. He has no wheezes. He has no rales.  Musculoskeletal: He exhibits no edema.          Assessment & Plan:  #1 cerumen impaction right canal. Irrigate 2 hypertension. Fair control.  Continue current medication

## 2011-12-29 ENCOUNTER — Other Ambulatory Visit: Payer: Self-pay | Admitting: Family Medicine

## 2012-03-05 ENCOUNTER — Other Ambulatory Visit: Payer: Self-pay | Admitting: Family Medicine

## 2012-06-03 ENCOUNTER — Other Ambulatory Visit: Payer: Self-pay | Admitting: Family Medicine

## 2012-06-30 ENCOUNTER — Ambulatory Visit (INDEPENDENT_AMBULATORY_CARE_PROVIDER_SITE_OTHER)
Admission: RE | Admit: 2012-06-30 | Discharge: 2012-06-30 | Disposition: A | Discharge status: Home / Self Care | Payer: Managed Care, Other (non HMO) | Source: Ambulatory Visit | Attending: Family Medicine | Admitting: Family Medicine

## 2012-06-30 ENCOUNTER — Encounter: Payer: Self-pay | Admitting: Family Medicine

## 2012-06-30 ENCOUNTER — Ambulatory Visit (INDEPENDENT_AMBULATORY_CARE_PROVIDER_SITE_OTHER): Payer: Managed Care, Other (non HMO) | Admitting: Family Medicine

## 2012-06-30 VITALS — BP 130/80 | Temp 98.0°F | Wt 192.0 lb

## 2012-06-30 DIAGNOSIS — R1031 Right lower quadrant pain: Secondary | ICD-10-CM

## 2012-06-30 LAB — POCT URINALYSIS DIPSTICK
Bilirubin, UA: NEGATIVE
Ketones, UA: NEGATIVE
Leukocytes, UA: NEGATIVE
Nitrite, UA: NEGATIVE
Spec Grav, UA: <=1.005
pH, UA: 7.5

## 2012-06-30 MED ORDER — OXYCODONE-ACETAMINOPHEN 5-325 MG PO TABS: ORAL_TABLET | ORAL | Status: DC

## 2012-06-30 NOTE — Progress Notes (Signed)
  Subjective:    Patient ID: Jermaine Brady, male    DOB: 1951/03/12, 61 y.o.   MRN: [id number]  HPI  Patient presents with acute issue of onset 2 days ago of acute right lower quadrant pain. This occurred at work Monday. Fairly severe 7/10 pain right lower quadrant. Achy quality. No alleviating factors. No exacerbating factors. Symptoms lasted a couple hours. His pain didn't recur until another episode earlier today. No history of kidney stones. No dysuria. No gross hematuria. Patient denies any fever, chills, nausea, vomiting, or any stool changes. Good appetite. Mild low back pain poorly localized lower lumbar region.  Patient has history of diverticulitis and had ruptured diverticulum with surgery and partial colon resection several years ago. No history of bowel obstruction. Denies any recent left lower quadrant pain.  Past Medical History  Diagnosis Date  . HYPERTENSION 10/23/2008  . ALLERGIC RHINITIS 10/23/2008  . DIVERTICULOSIS, COLON 10/23/2008  . Heart murmur   . Hepatitis C    Past Surgical History  Procedure Date  . Asd repair 1971  . Hernia repair 2005  . Colon surgery 2007    ,resection, diverticular rupture    reports that he has never smoked. He does not have any smokeless tobacco history on file. His alcohol and drug histories not on file. family history includes Alcohol abuse in his father and other and Heart disease in his other. No Known Allergies    Review of Systems  Constitutional: Negative for fever and chills.  Respiratory: Negative for cough and shortness of breath.   Cardiovascular: Negative for chest pain.  Gastrointestinal: Positive for abdominal pain. Negative for abdominal distention.       Objective:   Physical Exam  Constitutional: He appears well-developed and well-nourished.  Cardiovascular: Normal rate and regular rhythm.   Pulmonary/Chest: Effort normal and breath sounds normal. No respiratory distress. He has no wheezes. He has no rales.   Abdominal: Soft. Bowel sounds are normal. He exhibits no distension and no mass. There is no tenderness. There is no rebound and no guarding.       He has no reproducible tenderness whatsoever at this time  Musculoskeletal: He exhibits no edema.          Assessment & Plan:  Intermittent right lower quadrant pain. Clinically, question kidney stone. Severity of symptoms transient nature and lack of alleviating or exacerbating factors would make this hilum differential. He also has trace to moderate blood on urine dipstick. His current symptoms are not compatible with likely diverticulitis or appendicitis and he has no reproducible tenderness whatsoever this time. Start with KUB. If symptoms persist consider CT urogram to further assess  KUB shows right kidney lower pole stone unchanged from prior CT.  ?7 by 4 mm ureteral stone overlying left L3 transverse process.  Pt notified and pain free currently.  Will set up with urology for any recurrent pain.

## 2012-06-30 NOTE — Patient Instructions (Addendum)
Follow up for any fever, recurrent pain, nausea/vomiting.

## 2012-07-01 LAB — CBC WITH DIFFERENTIAL/PLATELET
Eosinophils Absolute: 0.1 10*3/uL (ref 0.0–0.7)
Hemoglobin: 16.8 g/dL (ref 13.0–17.0)
Lymphs Abs: 1.1 10*3/uL (ref 0.7–4.0)
MCHC: 34.6 g/dL (ref 30.0–36.0)
MCV: 88.2 fl (ref 78.0–100.0)
Monocytes Relative: 4.9 % (ref 3.0–12.0)
Neutrophils Relative %: 80.7 % — ABNORMAL HIGH (ref 43.0–77.0)
Platelets: 184 10*3/uL (ref 150.0–400.0)
RBC: 5.51 Mil/uL (ref 4.22–5.81)

## 2012-07-01 NOTE — Progress Notes (Signed)
Quick Note:  Pt informed on VM ______ 

## 2012-07-28 HISTORY — PX: SHOULDER ARTHROSCOPY W/ ROTATOR CUFF REPAIR: SHX2400

## 2012-09-05 ENCOUNTER — Other Ambulatory Visit: Payer: Self-pay | Admitting: Family Medicine

## 2012-10-04 ENCOUNTER — Other Ambulatory Visit (INDEPENDENT_AMBULATORY_CARE_PROVIDER_SITE_OTHER): Payer: Managed Care, Other (non HMO)

## 2012-10-04 LAB — POCT URINALYSIS DIPSTICK
Glucose, UA: NEGATIVE
Leukocytes, UA: NEGATIVE
Nitrite, UA: NEGATIVE
Protein, UA: NEGATIVE
Spec Grav, UA: 1.025
Urobilinogen, UA: 0.2
pH, UA: 6

## 2012-10-04 LAB — BASIC METABOLIC PANEL
BUN: 28 mg/dL — ABNORMAL HIGH (ref 6–23)
CO2: 27 mEq/L (ref 19–32)
Creatinine, Ser: 1.3 mg/dL (ref 0.4–1.5)
Glucose, Bld: 109 mg/dL — ABNORMAL HIGH (ref 70–99)
Potassium: 3.9 mEq/L (ref 3.5–5.1)

## 2012-10-04 LAB — CBC WITH DIFFERENTIAL/PLATELET
Eosinophils Absolute: 0.1 10*3/uL (ref 0.0–0.7)
Eosinophils Relative: 1.8 % (ref 0.0–5.0)
Hemoglobin: 15.9 g/dL (ref 13.0–17.0)
MCV: 86.3 fl (ref 78.0–100.0)
RBC: 5.32 Mil/uL (ref 4.22–5.81)
WBC: 5.9 10*3/uL (ref 4.5–10.5)

## 2012-10-04 LAB — LIPID PANEL
Cholesterol: 125 mg/dL (ref 0–200)
Total CHOL/HDL Ratio: 5

## 2012-10-04 LAB — HEPATIC FUNCTION PANEL
AST: 21 U/L (ref 0–37)
Bilirubin, Direct: 0.2 mg/dL (ref 0.0–0.3)

## 2012-10-11 ENCOUNTER — Encounter: Payer: Self-pay | Admitting: Family Medicine

## 2012-10-11 ENCOUNTER — Ambulatory Visit (INDEPENDENT_AMBULATORY_CARE_PROVIDER_SITE_OTHER): Payer: Managed Care, Other (non HMO) | Admitting: Family Medicine

## 2012-10-11 VITALS — BP 152/78 | HR 72 | Temp 98.1°F | Resp 12 | Ht 72.0 in | Wt 195.0 lb

## 2012-10-11 DIAGNOSIS — Z0000 Encounter for general adult medical examination without abnormal findings: Secondary | ICD-10-CM

## 2012-10-11 NOTE — Patient Instructions (Addendum)
Check on coverage for shingles vaccine Increase lisinopril hctz to two daily

## 2012-10-11 NOTE — Progress Notes (Signed)
  Subjective:    Patient ID: Jermaine Brady, male    DOB: 27-Oct-1950, 62 y.o.   MRN: [id number]  HPI Patient seen for well visit. He has history of hypertension, seasonal allergies, diverticulosis and history of hepatitis C Patient was treated for hepatitis C and is in remission. He had ASD repair on age 75 and presumably may have contracted hepatitis C then but no way to confirm. No other risk factors.  Tetanus 8 years ago. Nonsmoker. Colonoscopy 8 years ago. Hypertension treated with lisinopril HCTZ. Compliant with therapy.  Past Medical History  Diagnosis Date  . HYPERTENSION 10/23/2008  . ALLERGIC RHINITIS 10/23/2008  . DIVERTICULOSIS, COLON 10/23/2008  . Heart murmur   . Hepatitis C    Past Surgical History  Procedure Laterality Date  . Asd repair  1971  . Hernia repair  2005  . Colon surgery  2007    ,resection, diverticular rupture    reports that he has never smoked. He does not have any smokeless tobacco history on file. His alcohol and drug histories are not on file. family history includes Alcohol abuse in his father and other and Heart disease in his other. No Known Allergies   Review of Systems  Constitutional: Negative for fever, activity change, appetite change and fatigue.  HENT: Negative for ear pain, congestion and trouble swallowing.   Eyes: Negative for pain and visual disturbance.  Respiratory: Negative for cough, shortness of breath and wheezing.   Cardiovascular: Negative for chest pain and palpitations.  Gastrointestinal: Negative for nausea, vomiting, abdominal pain, diarrhea, constipation, blood in stool, abdominal distention and rectal pain.  Genitourinary: Negative for dysuria, hematuria and testicular pain.  Musculoskeletal: Negative for joint swelling and arthralgias.  Skin: Negative for rash.  Neurological: Negative for dizziness, syncope and headaches.  Hematological: Negative for adenopathy.  Psychiatric/Behavioral: Negative for confusion  and dysphoric mood.       Objective:   Physical Exam  Constitutional: He is oriented to person, place, and time. He appears well-developed and well-nourished. No distress.  HENT:  Head: Normocephalic and atraumatic.  Right Ear: External ear normal.  Left Ear: External ear normal.  Mouth/Throat: Oropharynx is clear and moist.  Eyes: Conjunctivae and EOM are normal. Pupils are equal, round, and reactive to light.  Neck: Normal range of motion. Neck supple. No thyromegaly present.  Cardiovascular: Normal rate, regular rhythm and normal heart sounds.   No murmur heard. Pulmonary/Chest: No respiratory distress. He has no wheezes. He has no rales.  Abdominal: Soft. Bowel sounds are normal. He exhibits no distension and no mass. There is no tenderness. There is no rebound and no guarding.  Genitourinary:  No hernia  Musculoskeletal: He exhibits no edema.  Lymphadenopathy:    He has no cervical adenopathy.  Neurological: He is alert and oriented to person, place, and time. He displays normal reflexes. No cranial nerve deficit.  Skin: No rash noted.  Psychiatric: He has a normal mood and affect.          Assessment & Plan:  Complete physical. Labs reviewed with patient. He has chronic low HDL otherwise labs stable. Hemoccult cards given. Will need repeat colonoscopy in 2 years. Followup in 2 weeks and repeat urinalysis. If still has hematuria on urine dipstick at that point urine micro-. Patient will check on coverage for shingles vaccine  Hypertension. Suboptimal control today. Increase lisinopril HCTZ 10/12.5-2 daily. Reassess 2 weeks

## 2012-10-25 ENCOUNTER — Ambulatory Visit (INDEPENDENT_AMBULATORY_CARE_PROVIDER_SITE_OTHER): Payer: Managed Care, Other (non HMO) | Admitting: Family Medicine

## 2012-10-25 ENCOUNTER — Encounter: Payer: Self-pay | Admitting: Family Medicine

## 2012-10-25 VITALS — BP 142/70 | Temp 98.3°F

## 2012-10-25 DIAGNOSIS — M719 Bursopathy, unspecified: Secondary | ICD-10-CM

## 2012-10-25 DIAGNOSIS — M67919 Unspecified disorder of synovium and tendon, unspecified shoulder: Secondary | ICD-10-CM

## 2012-10-25 DIAGNOSIS — Z23 Encounter for immunization: Secondary | ICD-10-CM

## 2012-10-25 DIAGNOSIS — I10 Essential (primary) hypertension: Secondary | ICD-10-CM

## 2012-10-25 DIAGNOSIS — M7581 Other shoulder lesions, right shoulder: Secondary | ICD-10-CM

## 2012-10-25 MED ORDER — LISINOPRIL-HYDROCHLOROTHIAZIDE 20-12.5 MG PO TABS: 1.0000 | ORAL_TABLET | Freq: Every day | ORAL | Status: DC

## 2012-10-25 NOTE — Progress Notes (Signed)
  Subjective:    Patient ID: Jermaine Brady, male    DOB: 11/03/1950, 62 y.o.   MRN: [id number]  HPI Hypertension. Recently titrated lisinopril HCTZ to 20/25 mg. Denies any side effects. Compliant with therapy.  Patient since last visit has checked with insurance regarding shingles vaccine and has good coverage and wishes to proceed. He has no contraindications.  Persistent right shoulder pains over 2 months duration. No specific injury. Pain worse with external rotation and abduction. No pain with internal rotation. No definite weakness. Starting to have more night pain. Denies any neck pain. No numbness or weakness.  Past Medical History  Diagnosis Date  . HYPERTENSION 10/23/2008  . ALLERGIC RHINITIS 10/23/2008  . DIVERTICULOSIS, COLON 10/23/2008  . Heart murmur   . Hepatitis C    Past Surgical History  Procedure Laterality Date  . Asd repair  1971  . Hernia repair  2005  . Colon surgery  2007    ,resection, diverticular rupture    reports that he has never smoked. He does not have any smokeless tobacco history on file. His alcohol and drug histories are not on file. family history includes Alcohol abuse in his father and other and Heart disease in his other. No Known Allergies     Review of Systems  Constitutional: Negative for appetite change and fatigue.  Eyes: Negative for visual disturbance.  Respiratory: Negative for cough, chest tightness and shortness of breath.   Cardiovascular: Negative for chest pain, palpitations and leg swelling.  Neurological: Negative for dizziness, syncope, weakness, light-headedness and headaches.       Objective:   Physical Exam  Constitutional: He appears well-developed and well-nourished.  Cardiovascular: Normal rate and regular rhythm.   Pulmonary/Chest: Effort normal and breath sounds normal. No respiratory distress. He has no wheezes. He has no rales.  Musculoskeletal: He exhibits no edema.  Patient's full range of motion  right shoulder. No specific areas of point tenderness. Pain with abduction greater than 90 and external rotation. No difficulties with internal rotation.  Neurological: He has normal reflexes.  No focal weakness          Assessment & Plan:  #1 hypertension. Improved. Increase in lisinopril HCTZ 20/12.5 one daily  #2 health maintenance. Shingles vaccine given. No contraindications #3 right shoulder pain. Suspect rotator cuff tendinitis. Patient requesting orthopedic referral and we'll set up.  No evidence for adhesive capsulitis of this time

## 2013-04-07 ENCOUNTER — Telehealth: Payer: Self-pay | Admitting: Family Medicine

## 2013-04-07 MED ORDER — OXYCODONE-ACETAMINOPHEN 5-325 MG PO TABS: ORAL_TABLET | ORAL | Status: DC

## 2013-04-07 NOTE — Telephone Encounter (Signed)
Last refill 06/30/12 #20 0 refills Last visit 10/25/12

## 2013-04-07 NOTE — Telephone Encounter (Signed)
May refill once. 

## 2013-04-07 NOTE — Telephone Encounter (Signed)
Pt wife is requesting new rx oxycodone. Pt is still having shoulder pain. Pt is waiting for MRI approve from [employer]. MRI is order through [facility]

## 2013-04-07 NOTE — Telephone Encounter (Signed)
Left message on answer machine... RX will be at the front for pickup

## 2013-04-09 DIAGNOSIS — S6990XA Unspecified injury of unspecified wrist, hand and finger(s), initial encounter: Secondary | ICD-10-CM | POA: Insufficient documentation

## 2013-04-25 ENCOUNTER — Other Ambulatory Visit (HOSPITAL_COMMUNITY): Payer: Self-pay | Admitting: Orthopedic Surgery

## 2013-04-25 ENCOUNTER — Ambulatory Visit (HOSPITAL_COMMUNITY)
Admission: RE | Admit: 2013-04-25 | Discharge: 2013-04-25 | Disposition: A | Discharge status: Home / Self Care | Payer: Managed Care, Other (non HMO) | Source: Ambulatory Visit | Attending: Orthopedic Surgery | Admitting: Orthopedic Surgery

## 2013-04-25 DIAGNOSIS — Z01818 Encounter for other preprocedural examination: Secondary | ICD-10-CM

## 2013-04-25 DIAGNOSIS — Z1389 Encounter for screening for other disorder: Secondary | ICD-10-CM | POA: Insufficient documentation

## 2013-05-18 ENCOUNTER — Telehealth: Payer: Self-pay

## 2013-05-18 NOTE — Telephone Encounter (Signed)
lmom for pt to call back

## 2013-05-18 NOTE — Telephone Encounter (Signed)
Can you please schedule patient for appointment for pre operative clearance

## 2013-05-19 NOTE — Telephone Encounter (Signed)
Pt is sch for 06-27-13

## 2013-06-07 ENCOUNTER — Ambulatory Visit (INDEPENDENT_AMBULATORY_CARE_PROVIDER_SITE_OTHER): Payer: Managed Care, Other (non HMO) | Admitting: Family Medicine

## 2013-06-07 ENCOUNTER — Encounter: Payer: Self-pay | Admitting: Family Medicine

## 2013-06-07 VITALS — BP 130/76 | HR 93 | Temp 97.7°F | Wt 185.0 lb

## 2013-06-07 DIAGNOSIS — M25511 Pain in right shoulder: Secondary | ICD-10-CM

## 2013-06-07 DIAGNOSIS — Z23 Encounter for immunization: Secondary | ICD-10-CM

## 2013-06-07 DIAGNOSIS — Z01818 Encounter for other preprocedural examination: Secondary | ICD-10-CM

## 2013-06-07 DIAGNOSIS — M25519 Pain in unspecified shoulder: Secondary | ICD-10-CM

## 2013-06-07 MED ORDER — OXYCODONE-ACETAMINOPHEN 5-325 MG PO TABS: ORAL_TABLET | ORAL | Status: DC

## 2013-06-07 NOTE — Progress Notes (Signed)
Pre visit review using our clinic review tool, if applicable. No additional management support is needed unless otherwise documented below in the visit note. 

## 2013-06-07 NOTE — Progress Notes (Signed)
  Subjective:    Patient ID: Jermaine Brady, male    DOB: May 17, 1951, 62 y.o.   MRN: [id number]  HPI Patient seen for preoperative clearance. He plans to have right shoulder partial acromioplasty with or without coracoacromial release, open resection distal clavicle and biceps tenodesis.   He's having significant amount of shoulder pain especially at night. Currently not taking any pain medications. Has chronic problems include history of hypertension and hepatitis C. No history of hepatic dysfunction Blood pressures been well controlled. He has no history of peripheral vascular disease or CAD. Has never smoked. No chronic lung problems.  Denies any recent chest pains, dizziness, abdominal pain He had previous EKGs though non-several years and none on record.  Past Medical History  Diagnosis Date  . HYPERTENSION 10/23/2008  . ALLERGIC RHINITIS 10/23/2008  . DIVERTICULOSIS, COLON 10/23/2008  . Heart murmur   . Hepatitis C    Past Surgical History  Procedure Laterality Date  . Asd repair  1971  . Hernia repair  2005  . Colon surgery  2007    ,resection, diverticular rupture    reports that he has never smoked. He does not have any smokeless tobacco history on file. His alcohol and drug histories are not on file. family history includes Alcohol abuse in his father and other; Heart disease in his other. No Known Allergies    Review of Systems  Constitutional: Negative for fever, activity change, appetite change and fatigue.  HENT: Negative for congestion, ear pain and trouble swallowing.   Eyes: Negative for pain and visual disturbance.  Respiratory: Negative for cough, shortness of breath and wheezing.   Cardiovascular: Negative for chest pain and palpitations.  Gastrointestinal: Negative for nausea, vomiting, abdominal pain, diarrhea, constipation, blood in stool, abdominal distention and rectal pain.  Genitourinary: Negative for dysuria, hematuria and testicular pain.   Musculoskeletal: Negative for arthralgias and joint swelling.  Skin: Negative for rash.  Neurological: Negative for dizziness, syncope and headaches.  Hematological: Negative for adenopathy.  Psychiatric/Behavioral: Negative for confusion and dysphoric mood.       Objective:   Physical Exam  Constitutional: He is oriented to person, place, and time. He appears well-developed and well-nourished.  Neck: Neck supple. No thyromegaly present.  No carotid bruits  Cardiovascular: Normal rate and regular rhythm.   Pulmonary/Chest: Effort normal and breath sounds normal. No respiratory distress. He has no wheezes. He has no rales.  Abdominal: Soft. Bowel sounds are normal. He exhibits no distension and no mass. There is no tenderness. There is no rebound and no guarding.  Musculoskeletal: He exhibits no edema.  Lymphadenopathy:    He has no cervical adenopathy.  Neurological: He is alert and oriented to person, place, and time. No cranial nerve deficit.  Skin: No rash noted.  Psychiatric: He has a normal mood and affect. His behavior is normal.          Assessment & Plan:  Preoperative clearance. Blood pressure has been well controlled. No history of CAD or any pulmonary problems. Obtain screening EKG. EKG NSR with no acute changes.  No contraindications for surgery noted. Pt requesting a few oxycodone to use only for severe night pain (for shoulder above).  We wrote for #30 .  He has not gotten any from orthopedist.

## 2013-06-27 ENCOUNTER — Ambulatory Visit: Payer: Managed Care, Other (non HMO) | Admitting: Family Medicine

## 2013-10-12 ENCOUNTER — Other Ambulatory Visit: Payer: Self-pay | Admitting: Family Medicine

## 2014-01-04 ENCOUNTER — Encounter: Payer: Self-pay | Admitting: Family Medicine

## 2014-07-27 ENCOUNTER — Other Ambulatory Visit: Payer: Self-pay | Admitting: Family Medicine

## 2014-07-31 ENCOUNTER — Encounter: Payer: Self-pay | Admitting: Family Medicine

## 2014-07-31 ENCOUNTER — Ambulatory Visit (INDEPENDENT_AMBULATORY_CARE_PROVIDER_SITE_OTHER): Payer: Managed Care, Other (non HMO) | Admitting: Family Medicine

## 2014-07-31 ENCOUNTER — Ambulatory Visit (INDEPENDENT_AMBULATORY_CARE_PROVIDER_SITE_OTHER): Payer: Managed Care, Other (non HMO)

## 2014-07-31 VITALS — BP 130/78 | HR 74 | Temp 97.6°F | Wt 194.0 lb

## 2014-07-31 DIAGNOSIS — Z23 Encounter for immunization: Secondary | ICD-10-CM

## 2014-07-31 DIAGNOSIS — I10 Essential (primary) hypertension: Secondary | ICD-10-CM

## 2014-07-31 MED ORDER — LISINOPRIL-HYDROCHLOROTHIAZIDE 20-12.5 MG PO TABS: 1.0000 | ORAL_TABLET | Freq: Every day | ORAL | Status: DC

## 2014-07-31 NOTE — Patient Instructions (Signed)
Consider complete physical at some point this year.  

## 2014-07-31 NOTE — Progress Notes (Signed)
Pre visit review using our clinic review tool, if applicable. No additional management support is needed unless otherwise documented below in the visit note. 

## 2014-07-31 NOTE — Progress Notes (Signed)
   Subjective:    Patient ID: Jermaine Brady, male    DOB: Jul 26, 1951, 64 y.o.   MRN: [id number]  HPI Patient seen for follow-up regarding hypertension. Takes lisinopril HCTZ. He has history of BPH treated with Flomax and is followed by urology. He had no complaints. No dizziness. No headaches. No chest pains. Compliant with therapy. Does not monitor blood pressure regularly  Past Medical History  Diagnosis Date  . HYPERTENSION 10/23/2008  . ALLERGIC RHINITIS 10/23/2008  . DIVERTICULOSIS, COLON 10/23/2008  . Heart murmur   . Hepatitis C    Past Surgical History  Procedure Laterality Date  . Asd repair  1971  . Hernia repair  2005  . Colon surgery  2007    ,resection, diverticular rupture    reports that he has never smoked. He does not have any smokeless tobacco history on file. His alcohol and drug histories are not on file. family history includes Alcohol abuse in his father and other; Heart disease in his other. No Known Allergies    Review of Systems  Constitutional: Negative for fatigue.  Eyes: Negative for visual disturbance.  Respiratory: Negative for cough, chest tightness and shortness of breath.   Cardiovascular: Negative for chest pain, palpitations and leg swelling.  Neurological: Negative for dizziness, syncope, weakness, light-headedness and headaches.       Objective:   Physical Exam  Constitutional: He is oriented to person, place, and time. He appears well-developed and well-nourished.  HENT:  Right Ear: External ear normal.  Left Ear: External ear normal.  Mouth/Throat: Oropharynx is clear and moist.  Eyes: Pupils are equal, round, and reactive to light.  Neck: Neck supple. No thyromegaly present.  Cardiovascular: Normal rate and regular rhythm.   Pulmonary/Chest: Effort normal and breath sounds normal. No respiratory distress. He has no wheezes. He has no rales.  Musculoskeletal: He exhibits no edema.  Neurological: He is alert and oriented to person,  place, and time.          Assessment & Plan:  Hypertension. Well controlled. Refill medication for one year. Recommend complete physical at some point later this year

## 2014-12-15 ENCOUNTER — Other Ambulatory Visit (INDEPENDENT_AMBULATORY_CARE_PROVIDER_SITE_OTHER): Payer: Managed Care, Other (non HMO)

## 2014-12-15 DIAGNOSIS — Z0000 Encounter for general adult medical examination without abnormal findings: Secondary | ICD-10-CM | POA: Diagnosis not present

## 2014-12-15 LAB — CBC WITH DIFFERENTIAL/PLATELET
BASOS ABS: 0 10*3/uL (ref 0.0–0.1)
Basophils Relative: 0.5 % (ref 0.0–3.0)
Eosinophils Absolute: 0.1 10*3/uL (ref 0.0–0.7)
Eosinophils Relative: 1.3 % (ref 0.0–5.0)
HCT: 46 % (ref 39.0–52.0)
HEMOGLOBIN: 16.1 g/dL (ref 13.0–17.0)
LYMPHS ABS: 0.9 10*3/uL (ref 0.7–4.0)
Lymphocytes Relative: 13 % (ref 12.0–46.0)
MCHC: 35.1 g/dL (ref 30.0–36.0)
MCV: 85.9 fl (ref 78.0–100.0)
Monocytes Absolute: 0.5 10*3/uL (ref 0.1–1.0)
Monocytes Relative: 7.1 % (ref 3.0–12.0)
NEUTROS ABS: 5.3 10*3/uL (ref 1.4–7.7)
Neutrophils Relative %: 78.1 % — ABNORMAL HIGH (ref 43.0–77.0)
Platelets: 141 10*3/uL — ABNORMAL LOW (ref 150.0–400.0)
RBC: 5.36 Mil/uL (ref 4.22–5.81)
RDW: 14.5 % (ref 11.5–15.5)
WBC: 6.8 10*3/uL (ref 4.0–10.5)

## 2014-12-15 LAB — COMPREHENSIVE METABOLIC PANEL
ALBUMIN: 4.5 g/dL (ref 3.5–5.2)
ALT: 16 U/L (ref 0–53)
AST: 13 U/L (ref 0–37)
Alkaline Phosphatase: 67 U/L (ref 39–117)
BUN: 22 mg/dL (ref 6–23)
CALCIUM: 9.8 mg/dL (ref 8.4–10.5)
CHLORIDE: 103 meq/L (ref 96–112)
CO2: 30 mEq/L (ref 19–32)
Creatinine, Ser: 1.07 mg/dL (ref 0.40–1.50)
GFR: 73.78 mL/min (ref >60.00)
Glucose, Bld: 94 mg/dL (ref 70–99)
POTASSIUM: 4.4 meq/L (ref 3.5–5.1)
SODIUM: 139 meq/L (ref 135–145)
TOTAL PROTEIN: 6.6 g/dL (ref 6.0–8.3)
Total Bilirubin: 0.8 mg/dL (ref 0.2–1.2)

## 2014-12-15 LAB — LIPID PANEL
Cholesterol: 109 mg/dL (ref 0–200)
HDL: 28.8 mg/dL — ABNORMAL LOW (ref >39.00)
LDL Cholesterol: 67 mg/dL (ref 0–99)
NONHDL: 80.2
Total CHOL/HDL Ratio: 4
Triglycerides: 64 mg/dL (ref 0.0–149.0)
VLDL: 12.8 mg/dL (ref 0.0–40.0)

## 2014-12-15 LAB — PSA: PSA: 1.2 ng/mL (ref 0.10–4.00)

## 2014-12-15 LAB — TSH: TSH: 1.66 u[IU]/mL (ref 0.35–4.50)

## 2014-12-22 ENCOUNTER — Encounter: Payer: Self-pay | Admitting: Family Medicine

## 2014-12-22 ENCOUNTER — Ambulatory Visit (INDEPENDENT_AMBULATORY_CARE_PROVIDER_SITE_OTHER): Payer: Managed Care, Other (non HMO) | Admitting: Family Medicine

## 2014-12-22 VITALS — BP 140/78 | HR 80 | Temp 98.3°F | Ht 71.26 in | Wt 186.0 lb

## 2014-12-22 DIAGNOSIS — M542 Cervicalgia: Secondary | ICD-10-CM

## 2014-12-22 DIAGNOSIS — Z0000 Encounter for general adult medical examination without abnormal findings: Secondary | ICD-10-CM

## 2014-12-22 DIAGNOSIS — Z23 Encounter for immunization: Secondary | ICD-10-CM

## 2014-12-22 NOTE — Progress Notes (Signed)
Pre visit review using our clinic review tool, if applicable. No additional management support is needed unless otherwise documented below in the visit note. 

## 2014-12-22 NOTE — Progress Notes (Signed)
Subjective:    Patient ID: Jermaine Brady, male    DOB: 07/26/1951, 64 y.o.   MRN: [id number]  HPI   Patient here for complete physical- and to address a new separate problem of cervical neck pain as below.  He has history of hypertension, diverticulosis, hepatitis C. Tetanus needs to be boosted. Due for repeat colonoscopy. Needs follow-up. Blood pressures have been well controlled. Only rare lightheadedness when working out in the heat and first standing up.  Progressive left neck pain lower cervical region. He has had progressive restricted range of motion especially with lateral bending and rotation to the left side. This is starting to impair his work somewhat. No radiculopathy symptoms. Denies any upper extremity weakness or numbness. No alleviating factors.  Moderate discomfort.  Past Medical History  Diagnosis Date  . HYPERTENSION 10/23/2008  . ALLERGIC RHINITIS 10/23/2008  . DIVERTICULOSIS, COLON 10/23/2008  . Heart murmur   . Hepatitis C    Past Surgical History  Procedure Laterality Date  . Asd repair  1971  . Hernia repair  2005  . Colon surgery  2007    ,resection, diverticular rupture    reports that he has never smoked. He does not have any smokeless tobacco history on file. His alcohol and drug histories are not on file. family history includes Alcohol abuse in his father and other; Heart disease in his other. No Known Allergies    Review of Systems  Constitutional: Negative for fever, activity change, appetite change, fatigue and unexpected weight change.  HENT: Negative for congestion, ear pain and trouble swallowing.   Eyes: Negative for pain and visual disturbance.  Respiratory: Negative for cough, shortness of breath and wheezing.   Cardiovascular: Negative for chest pain and palpitations.  Gastrointestinal: Negative for nausea, vomiting, abdominal pain, diarrhea, constipation, blood in stool, abdominal distention and rectal pain.  Genitourinary: Negative  for dysuria, hematuria and testicular pain.  Musculoskeletal: Positive for arthralgias, neck pain and neck stiffness. Negative for joint swelling.  Skin: Negative for rash.  Neurological: Negative for dizziness, syncope and headaches.  Hematological: Negative for adenopathy.  Psychiatric/Behavioral: Negative for confusion and dysphoric mood.       Objective:   Physical Exam  Constitutional: He is oriented to person, place, and time. He appears well-developed and well-nourished. No distress.  HENT:  Head: Normocephalic and atraumatic.  Right Ear: External ear normal.  Left Ear: External ear normal.  Mouth/Throat: Oropharynx is clear and moist.  Eyes: Conjunctivae and EOM are normal. Pupils are equal, round, and reactive to light.  Neck: Normal range of motion. Neck supple. No thyromegaly present.  Cardiovascular: Normal rate, regular rhythm and normal heart sounds.   No murmur heard. Pulmonary/Chest: No respiratory distress. He has no wheezes. He has no rales.  Abdominal: Soft. Bowel sounds are normal. He exhibits no distension and no mass. There is no tenderness. There is no rebound and no guarding.  Musculoskeletal: He exhibits no edema.  Restricted range of motion cervical spine especially with lateral bending and rotation to the left and right  Lymphadenopathy:    He has no cervical adenopathy.  Neurological: He is alert and oriented to person, place, and time. He displays normal reflexes. No cranial nerve deficit.  Skin: No rash noted.  Psychiatric: He has a normal mood and affect.          Assessment & Plan:  Complete physical. Labs reviewed. No major concerns. Schedule repeat colonoscopy. Tetanus booster given.  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 Progressive symptoms over many months. Restricted range of motion. Set up cervical spine films

## 2014-12-29 ENCOUNTER — Ambulatory Visit (INDEPENDENT_AMBULATORY_CARE_PROVIDER_SITE_OTHER)
Admission: RE | Admit: 2014-12-29 | Discharge: 2014-12-29 | Disposition: A | Discharge status: Home / Self Care | Payer: Managed Care, Other (non HMO) | Source: Ambulatory Visit | Attending: Family Medicine | Admitting: Family Medicine

## 2014-12-29 DIAGNOSIS — M542 Cervicalgia: Secondary | ICD-10-CM

## 2015-03-06 ENCOUNTER — Encounter: Payer: Self-pay | Admitting: Internal Medicine

## 2015-04-18 ENCOUNTER — Other Ambulatory Visit: Payer: Self-pay | Admitting: Family Medicine

## 2015-04-18 NOTE — Telephone Encounter (Signed)
Pt needs [facility] lisinopril-hctz 20-12.5 mg #90 w/refills fax tol [phone]

## 2015-04-19 MED ORDER — LISINOPRIL-HYDROCHLOROTHIAZIDE 20-12.5 MG PO TABS: 1.0000 | ORAL_TABLET | Freq: Every day | ORAL | Status: DC

## 2015-04-19 NOTE — Telephone Encounter (Signed)
Rx sent 

## 2015-05-21 ENCOUNTER — Encounter: Payer: Managed Care, Other (non HMO) | Admitting: Internal Medicine

## 2015-05-29 ENCOUNTER — Ambulatory Visit (AMBULATORY_SURGERY_CENTER): Payer: Self-pay

## 2015-05-29 VITALS — Ht 73.0 in | Wt 190.2 lb

## 2015-05-29 DIAGNOSIS — Z860100 Personal history of colon polyps, unspecified: Secondary | ICD-10-CM

## 2015-05-29 MED ORDER — SUPREP BOWEL PREP KIT 17.5-3.13-1.6 GM/177ML PO SOLN: 1.0000 | Freq: Once | ORAL | Status: DC

## 2015-05-29 NOTE — Progress Notes (Signed)
No allergies to eggs or soy No diet/weight loss meds No home oxygen No past problems with anesthesia  Has email; refused emmi has had colonoscopy before

## 2015-06-12 ENCOUNTER — Encounter: Payer: Self-pay | Admitting: Internal Medicine

## 2015-06-12 ENCOUNTER — Ambulatory Visit (AMBULATORY_SURGERY_CENTER): Payer: PPO | Admitting: Internal Medicine

## 2015-06-12 VITALS — BP 140/80 | HR 59 | Temp 96.9°F | Resp 18 | Ht 73.0 in | Wt 190.0 lb

## 2015-06-12 DIAGNOSIS — D125 Benign neoplasm of sigmoid colon: Secondary | ICD-10-CM

## 2015-06-12 DIAGNOSIS — Z1211 Encounter for screening for malignant neoplasm of colon: Secondary | ICD-10-CM | POA: Diagnosis present

## 2015-06-12 DIAGNOSIS — D123 Benign neoplasm of transverse colon: Secondary | ICD-10-CM | POA: Diagnosis not present

## 2015-06-12 DIAGNOSIS — D122 Benign neoplasm of ascending colon: Secondary | ICD-10-CM

## 2015-06-12 HISTORY — PX: COLONOSCOPY: SHX174

## 2015-06-12 MED ORDER — SODIUM CHLORIDE 0.9 % IV SOLN: 500.0000 mL | INTRAVENOUS | Status: DC

## 2015-06-12 NOTE — Patient Instructions (Addendum)
YOU HAD AN ENDOSCOPIC PROCEDURE TODAY AT [facility]:   Refer to the procedure report that was given to you for any specific questions about what was found during the examination.  If the procedure report does not answer your questions, please call your gastroenterologist to clarify.  If you requested that your care partner not be given the details of your procedure findings, then the procedure report has been included in a sealed envelope for you to review at your convenience later.  YOU SHOULD EXPECT: Some feelings of bloating in the abdomen. Passage of more gas than usual.  Walking can help get rid of the air that was put into your GI tract during the procedure and reduce the bloating. If you had a lower endoscopy (such as a colonoscopy or flexible sigmoidoscopy) you may notice spotting of blood in your stool or on the toilet paper. If you underwent a bowel prep for your procedure, you may not have a normal bowel movement for a few days.  Please Note:  You might notice some irritation and congestion in your nose or some drainage.  This is from the oxygen used during your procedure.  There is no need for concern and it should clear up in a day or so.  SYMPTOMS TO REPORT IMMEDIATELY:   Following upper endoscopy (EGD)  Vomiting of blood or coffee ground material  New chest pain or pain under the shoulder blades  Painful or persistently difficult swallowing  New shortness of breath  Fever of 100F or higher  Black, tarry-looking stools  For urgent or emergent issues, a gastroenterologist can be reached at any hour by calling [phone].   DIET: Your first meal following the procedure should be a small meal and then it is ok to progress to your normal diet. Heavy or fried foods are harder to digest and may make you feel nauseous or bloated.  Likewise, meals heavy in dairy and vegetables can increase bloating.  Drink plenty of fluids but you should avoid alcoholic beverages for  24 hours.  ACTIVITY:  You should plan to take it easy for the rest of today and you should NOT DRIVE or use heavy machinery until tomorrow (because of the sedation medicines used during the test).    FOLLOW UP: Our staff will call the number listed on your records the next business day following your procedure to check on you and address any questions or concerns that you may have regarding the information given to you following your procedure. If we do not reach you, we will leave a message.  However, if you are feeling well and you are not experiencing any problems, there is no need to return our call.  We will assume that you have returned to your regular daily activities without incident.  If any biopsies were taken you will be contacted by phone or by letter within the next 1-3 weeks.  Please call us at [phone] if you have not heard about the biopsies in 3 weeks.    SIGNATURES/CONFIDENTIALITY: You and/or your care partner have signed paperwork which will be entered into your electronic medical record.  These signatures attest to the fact that that the information above on your After Visit Summary has been reviewed and is understood.  Full responsibility of the confidentiality of this discharge information lies with you and/or your care-partner.  Polyps/ Diverticulosis handout given Await pathology resultsYOU HAD AN ENDOSCOPIC PROCEDURE TODAY AT [facility]:   Refer  to the procedure report that was given to you for any specific questions about what was found during the examination.  If the procedure report does not answer your questions, please call your gastroenterologist to clarify.  If you requested that your care partner not be given the details of your procedure findings, then the procedure report has been included in a sealed envelope for you to review at your convenience later.  YOU SHOULD EXPECT: Some feelings of bloating in the abdomen. Passage of more gas than  usual.  Walking can help get rid of the air that was put into your GI tract during the procedure and reduce the bloating. If you had a lower endoscopy (such as a colonoscopy or flexible sigmoidoscopy) you may notice spotting of blood in your stool or on the toilet paper. If you underwent a bowel prep for your procedure, you may not have a normal bowel movement for a few days.  Please Note:  You might notice some irritation and congestion in your nose or some drainage.  This is from the oxygen used during your procedure.  There is no need for concern and it should clear up in a day or so.  SYMPTOMS TO REPORT IMMEDIATELY:   Following lower endoscopy (colonoscopy or flexible sigmoidoscopy):  Excessive amounts of blood in the stool  Significant tenderness or worsening of abdominal pains  Swelling of the abdomen that is new, acute  Fever of 100F or higher   For urgent or emergent issues, a gastroenterologist can be reached at any hour by calling [phone].   DIET: Your first meal following the procedure should be a small meal and then it is ok to progress to your normal diet. Heavy or fried foods are harder to digest and may make you feel nauseous or bloated.  Likewise, meals heavy in dairy and vegetables can increase bloating.  Drink plenty of fluids but you should avoid alcoholic beverages for 24 hours.  ACTIVITY:  You should plan to take it easy for the rest of today and you should NOT DRIVE or use heavy machinery until tomorrow (because of the sedation medicines used during the test).    FOLLOW UP: Our staff will call the number listed on your records the next business day following your procedure to check on you and address any questions or concerns that you may have regarding the information given to you following your procedure. If we do not reach you, we will leave a message.  However, if you are feeling well and you are not experiencing any problems, there is no need to return our  call.  We will assume that you have returned to your regular daily activities without incident.  If any biopsies were taken you will be contacted by phone or by letter within the next 1-3 weeks.  Please call us at [phone] if you have not heard about the biopsies in 3 weeks.    SIGNATURES/CONFIDENTIALITY: You and/or your care partner have signed paperwork which will be entered into your electronic medical record.  These signatures attest to the fact that that the information above on your After Visit Summary has been reviewed and is understood.  Full responsibility of the confidentiality of this discharge information lies with you and/or your care-partner.

## 2015-06-12 NOTE — Op Note (Signed)
[place]  [address] [place], [id number]   COLONOSCOPY PROCEDURE REPORT  PATIENT: Jermaine Brady, Jermaine Brady  MR#: [id number] BIRTHDATE: [birth year] , 29  yrs. old GENDER: male ENDOSCOPIST: Jerene Bears, MD REFERRED [id number] Burchette, M.D. PROCEDURE DATE:  06/12/2015 PROCEDURE:   Colonoscopy, screening, Colonoscopy with cold biopsy polypectomy, and Colonoscopy with snare polypectomy First Screening Colonoscopy - Avg.  risk and is 50 yrs.  old or older - No.  Prior Negative Screening - Now for repeat screening. 10 or more years since last screening  History of Adenoma - Now for follow-up colonoscopy & has been > or = to 3 yrs.  N/A  Polyps removed today? Yes ASA CLASS:   Class II INDICATIONS:Screening for colonic neoplasia and Colorectal Neoplasm Risk Assessment for this procedure is average risk. MEDICATIONS: Monitored anesthesia care and Propofol 400 mg IV  DESCRIPTION OF PROCEDURE:   After the risks benefits and alternatives of the procedure were thoroughly explained, informed consent was obtained.  The digital rectal exam revealed no rectal mass.   The LB CF-H180AL Loaner [id number]  endoscope was introduced through the anus and advanced to the cecum, which was identified by both the appendix and ileocecal valve. No adverse events experienced.   The quality of the prep was good.  (Suprep was used) The instrument was then slowly withdrawn as the colon was fully examined. Estimated blood loss is zero unless otherwise noted in this procedure report.   COLON FINDINGS: A sessile polyp measuring 8 mm in size with a mucous cap was found in the ascending colon.  A polypectomy was performed with a cold snare.  The resection was complete, the polyp tissue was completely retrieved and sent to histology.   A sessile polyp measuring 20 mm in size with a mucous cap was found in the ascending colon.  A polypectomy was performed using snare cautery. The resection was complete,  the polyp tissue was completely retrieved and sent to histology.   Two sessile polyps ranging between 3-57mm in size were found in the transverse colon and sigmoid colon.  Polypectomies were performed with cold forceps. The resection was complete, the polyp tissue was completely retrieved and sent to histology.   There was evidence of a normal appearing prior surgical anastomosis in the sigmoid colon.   There was mild diverticulosis noted in the descending colon.  Retroflexed views revealed internal hemorrhoids. The time to cecum = 9.2 Withdrawal time = 16.3   The scope was withdrawn and the procedure completed.  COMPLICATIONS: There were no immediate complications.  ENDOSCOPIC IMPRESSION: 1.   Sessile polyp was found in the ascending colon; polypectomy was performed with a cold snare 2.   Sessile polyp was found in the ascending colon; polypectomy was performed using snare cautery 3.   Two sessile polyps ranging between 3-69mm in size were found in the transverse colon and sigmoid colon; polypectomies were performed with cold forceps 4.   There was evidence of prior surgical anastomosis in the sigmoid colon 5.   Mild diverticulosis was noted in the descending colon  RECOMMENDATIONS: 1.  Await pathology results 2.  Avoid all NSAIDs for the next 2 weeks. 3.  High fiber diet 4.  If the polyps removed today are proven to be adenomatous (pre-cancerous) polyps, you will need a colonoscopy in 3 years. Otherwise you should continue to follow colorectal cancer screening guidelines for "routine risk" patients with a colonoscopy in 10 years.  You will receive a letter within 1-2  weeks with the results of your biopsy as well as final recommendations.  Please call my office if you have not received a letter after 3 weeks.  eSigned:  Jerene Bears, MD 06/12/2015 3:40 PM   cc:  the patient, PCP   PATIENT NAME:  Jermaine Brady, Jermaine Brady MR#: [id number]

## 2015-06-12 NOTE — Progress Notes (Signed)
Called to room to assist during endoscopic procedure.  Patient ID and intended procedure confirmed with present staff. Received instructions for my participation in the procedure from the performing physician.  

## 2015-06-12 NOTE — Progress Notes (Signed)
To PACU-VSS. Awake and alert. Report to RN

## 2015-06-13 ENCOUNTER — Telehealth: Payer: Self-pay

## 2015-06-13 NOTE — Telephone Encounter (Signed)
  Follow up Call-  Call back number 06/12/2015  Post procedure Call Back phone  # [phone]  Permission to leave phone message Yes     Patient questions:  Do you have a fever, pain , or abdominal swelling? No. Pain Score  0 *  Have you tolerated food without any problems? Yes.    Have you been able to return to your normal activities? Yes.    Do you have any questions about your discharge instructions: Diet   No. Medications  No. Follow up visit  No.  Do you have questions or concerns about your Care? No.  Actions: * If pain score is 4 or above: No action needed, pain <4.

## 2015-06-19 ENCOUNTER — Encounter: Payer: Self-pay | Admitting: Internal Medicine

## 2015-08-07 DIAGNOSIS — Z125 Encounter for screening for malignant neoplasm of prostate: Secondary | ICD-10-CM | POA: Diagnosis not present

## 2015-08-07 DIAGNOSIS — N5201 Erectile dysfunction due to arterial insufficiency: Secondary | ICD-10-CM | POA: Diagnosis not present

## 2015-08-07 DIAGNOSIS — Z0000 Encounter for general adult medical examination without abnormal findings: Secondary | ICD-10-CM | POA: Diagnosis not present

## 2015-08-07 DIAGNOSIS — N138 Other obstructive and reflux uropathy: Secondary | ICD-10-CM | POA: Diagnosis not present

## 2015-08-07 DIAGNOSIS — N401 Enlarged prostate with lower urinary tract symptoms: Secondary | ICD-10-CM | POA: Diagnosis not present

## 2015-10-13 DIAGNOSIS — J028 Acute pharyngitis due to other specified organisms: Secondary | ICD-10-CM | POA: Diagnosis not present

## 2015-10-13 DIAGNOSIS — J0100 Acute maxillary sinusitis, unspecified: Secondary | ICD-10-CM | POA: Diagnosis not present

## 2015-10-31 DIAGNOSIS — K8020 Calculus of gallbladder without cholecystitis without obstruction: Secondary | ICD-10-CM | POA: Diagnosis not present

## 2015-10-31 DIAGNOSIS — R3915 Urgency of urination: Secondary | ICD-10-CM | POA: Diagnosis not present

## 2015-10-31 DIAGNOSIS — Z0000 Encounter for general adult medical examination without abnormal findings: Secondary | ICD-10-CM | POA: Diagnosis not present

## 2015-10-31 DIAGNOSIS — N200 Calculus of kidney: Secondary | ICD-10-CM | POA: Diagnosis not present

## 2015-10-31 DIAGNOSIS — N401 Enlarged prostate with lower urinary tract symptoms: Secondary | ICD-10-CM | POA: Diagnosis not present

## 2015-10-31 DIAGNOSIS — Z125 Encounter for screening for malignant neoplasm of prostate: Secondary | ICD-10-CM | POA: Diagnosis not present

## 2015-10-31 DIAGNOSIS — N138 Other obstructive and reflux uropathy: Secondary | ICD-10-CM | POA: Diagnosis not present

## 2015-10-31 DIAGNOSIS — Z87442 Personal history of urinary calculi: Secondary | ICD-10-CM | POA: Diagnosis not present

## 2016-04-24 ENCOUNTER — Other Ambulatory Visit: Payer: Self-pay | Admitting: Family Medicine

## 2016-05-19 ENCOUNTER — Emergency Department (HOSPITAL_BASED_OUTPATIENT_CLINIC_OR_DEPARTMENT_OTHER): Payer: PPO

## 2016-05-19 ENCOUNTER — Encounter (HOSPITAL_BASED_OUTPATIENT_CLINIC_OR_DEPARTMENT_OTHER): Payer: Self-pay

## 2016-05-19 ENCOUNTER — Observation Stay (HOSPITAL_BASED_OUTPATIENT_CLINIC_OR_DEPARTMENT_OTHER)
Admission: EM | Admit: 2016-05-19 | Discharge: 2016-05-20 | Disposition: A | Discharge status: Home / Self Care | Payer: PPO | Attending: Family Medicine | Admitting: Family Medicine

## 2016-05-19 DIAGNOSIS — R002 Palpitations: Secondary | ICD-10-CM | POA: Diagnosis not present

## 2016-05-19 DIAGNOSIS — Z23 Encounter for immunization: Secondary | ICD-10-CM | POA: Insufficient documentation

## 2016-05-19 DIAGNOSIS — R079 Chest pain, unspecified: Secondary | ICD-10-CM | POA: Diagnosis not present

## 2016-05-19 DIAGNOSIS — R42 Dizziness and giddiness: Secondary | ICD-10-CM | POA: Diagnosis not present

## 2016-05-19 DIAGNOSIS — Z8249 Family history of ischemic heart disease and other diseases of the circulatory system: Secondary | ICD-10-CM | POA: Diagnosis not present

## 2016-05-19 DIAGNOSIS — R0789 Other chest pain: Secondary | ICD-10-CM | POA: Diagnosis not present

## 2016-05-19 DIAGNOSIS — I10 Essential (primary) hypertension: Secondary | ICD-10-CM | POA: Diagnosis not present

## 2016-05-19 DIAGNOSIS — Z79899 Other long term (current) drug therapy: Secondary | ICD-10-CM | POA: Insufficient documentation

## 2016-05-19 DIAGNOSIS — R0782 Intercostal pain: Secondary | ICD-10-CM | POA: Diagnosis not present

## 2016-05-19 HISTORY — DX: Personal history of urinary calculi: Z87.442

## 2016-05-19 HISTORY — DX: Unspecified osteoarthritis, unspecified site: M19.90

## 2016-05-19 LAB — BASIC METABOLIC PANEL
Anion gap: 8 (ref 5–15)
BUN: 25 mg/dL — AB (ref 6–20)
CHLORIDE: 103 mmol/L (ref 101–111)
CO2: 27 mmol/L (ref 22–32)
Calcium: 10 mg/dL (ref 8.9–10.3)
Creatinine, Ser: 1.21 mg/dL (ref 0.61–1.24)
GFR calc Af Amer: >60 mL/min (ref >60)
GFR calc non Af Amer: >60 mL/min (ref >60)
Glucose, Bld: 129 mg/dL — ABNORMAL HIGH (ref 65–99)
POTASSIUM: 3.6 mmol/L (ref 3.5–5.1)
SODIUM: 138 mmol/L (ref 135–145)

## 2016-05-19 LAB — TROPONIN I
Troponin I: <0.03 ng/mL (ref <0.03)
Troponin I: <0.03 ng/mL (ref <0.03)

## 2016-05-19 LAB — CBC
HCT: 45.3 % (ref 39.0–52.0)
HEMATOCRIT: 45.6 % (ref 39.0–52.0)
Hemoglobin: 16.3 g/dL (ref 13.0–17.0)
Hemoglobin: 15.8 g/dL (ref 13.0–17.0)
MCH: 30.4 pg (ref 26.0–34.0)
MCH: 29.8 pg (ref 26.0–34.0)
MCHC: 35.7 g/dL (ref 30.0–36.0)
MCHC: 34.9 g/dL (ref 30.0–36.0)
MCV: 85.1 fL (ref 78.0–100.0)
MCV: 85.5 fL (ref 78.0–100.0)
PLATELETS: 137 10*3/uL — AB (ref 150–400)
Platelets: 127 10*3/uL — ABNORMAL LOW (ref 150–400)
RBC: 5.36 MIL/uL (ref 4.22–5.81)
RBC: 5.3 MIL/uL (ref 4.22–5.81)
RDW: 14.4 % (ref 11.5–15.5)
RDW: 13.9 % (ref 11.5–15.5)
WBC: 5.6 10*3/uL (ref 4.0–10.5)
WBC: 6.5 10*3/uL (ref 4.0–10.5)

## 2016-05-19 LAB — CREATININE, SERUM
Creatinine, Ser: 1.15 mg/dL (ref 0.61–1.24)
GFR calc Af Amer: >60 mL/min (ref >60)
GFR calc non Af Amer: >60 mL/min (ref >60)

## 2016-05-19 MED ORDER — ACETAMINOPHEN 325 MG PO TABS
650.0000 mg | ORAL_TABLET | Freq: Four times a day (QID) | ORAL | Status: DC | PRN
  Administered 2016-05-19: 650 mg via ORAL
  Filled 2016-05-19: qty 2

## 2016-05-19 MED ORDER — ASPIRIN 81 MG PO CHEW
324.0000 mg | CHEWABLE_TABLET | Freq: Once | ORAL | Status: AC
  Administered 2016-05-19: 324 mg via ORAL
  Filled 2016-05-19: qty 4

## 2016-05-19 MED ORDER — ENOXAPARIN SODIUM 40 MG/0.4ML ~~LOC~~ SOLN: 40.0000 mg | SUBCUTANEOUS | Status: DC

## 2016-05-19 MED ORDER — ACETAMINOPHEN 650 MG RE SUPP: 650.0000 mg | Freq: Four times a day (QID) | RECTAL | Status: DC | PRN

## 2016-05-19 MED ORDER — ONDANSETRON HCL 4 MG/2ML IJ SOLN: 4.0000 mg | Freq: Four times a day (QID) | INTRAMUSCULAR | Status: DC | PRN

## 2016-05-19 MED ORDER — ONDANSETRON HCL 4 MG PO TABS: 4.0000 mg | ORAL_TABLET | Freq: Four times a day (QID) | ORAL | Status: DC | PRN

## 2016-05-19 MED ORDER — HYDROCHLOROTHIAZIDE 12.5 MG PO CAPS
12.5000 mg | ORAL_CAPSULE | Freq: Every day | ORAL | Status: DC
  Administered 2016-05-19: 12.5 mg via ORAL
  Filled 2016-05-19: qty 1

## 2016-05-19 MED ORDER — LISINOPRIL-HYDROCHLOROTHIAZIDE 20-12.5 MG PO TABS: 1.0000 | ORAL_TABLET | Freq: Every day | ORAL | Status: DC

## 2016-05-19 MED ORDER — TAMSULOSIN HCL 0.4 MG PO CAPS: 0.4000 mg | ORAL_CAPSULE | Freq: Every day | ORAL | Status: DC

## 2016-05-19 MED ORDER — LISINOPRIL 20 MG PO TABS
20.0000 mg | ORAL_TABLET | Freq: Every day | ORAL | Status: DC
  Administered 2016-05-19: 20 mg via ORAL
  Filled 2016-05-19: qty 1

## 2016-05-19 MED ORDER — NITROGLYCERIN 0.4 MG SL SUBL
0.4000 mg | SUBLINGUAL_TABLET | SUBLINGUAL | Status: DC | PRN
  Filled 2016-05-19: qty 1

## 2016-05-19 MED ORDER — INFLUENZA VAC SPLIT QUAD 0.5 ML IM SUSY
0.5000 mL | PREFILLED_SYRINGE | INTRAMUSCULAR | Status: AC
  Administered 2016-05-20: 0.5 mL via INTRAMUSCULAR

## 2016-05-19 MED ORDER — PNEUMOCOCCAL VAC POLYVALENT 25 MCG/0.5ML IJ INJ
0.5000 mL | INJECTION | INTRAMUSCULAR | Status: AC
  Administered 2016-05-20: 0.5 mL via INTRAMUSCULAR
  Filled 2016-05-19: qty 0.5

## 2016-05-19 MED ORDER — ASPIRIN 81 MG PO CHEW: 324.0000 mg | CHEWABLE_TABLET | Freq: Every day | ORAL | Status: DC

## 2016-05-19 NOTE — ED Notes (Signed)
Attempted to call report to receiving RN at transfer to [id number], RN unable to take report - secretary informed that [facility] has left and in route with patient. RN to callback. Report given to [facility] RN.

## 2016-05-19 NOTE — ED Notes (Signed)
[facility] is transporting patient to [facility] ---room [phone]

## 2016-05-19 NOTE — Progress Notes (Signed)
[facility] called for new pt.

## 2016-05-19 NOTE — ED Triage Notes (Signed)
Pt reports dull chest pain that began this morning, radiates down left arm, reports associated lightheadedness while driving. Reports Atrial Septal Defect repair at age 65.

## 2016-05-19 NOTE — Progress Notes (Signed)
   Patient coming from [facility] emergency room for further evaluation and observation admission secondary to chest pain.HEART score 5-6. Pt accepted to Telemetry bed at [facility] on the [facility] service.   Linna Darner, MD [facility] 05/19/2016, 10:53 AM ]

## 2016-05-19 NOTE — H&P (Addendum)
History and Physical    URA SLAIGHT [id number] DOB: 08/09/1950 DOA: 05/19/2016  Referring MD/NP/PA: EDP PCP: Eulas Post, MD  Chief Complaint: chest pain  HPI: Jermaine Brady is a 65 y.o. male with medical history significant for hypertension, BPH, remote history of ASD status post repair as a child, presents to the ER at [facility] today with left-sided chest pain. Patient reports feeling well after he woke up later in the morning he noticed a mild ache in the left side of his chest, this was similar to musculoskeletal pains that he experiences in his upper neck, hence didn't feel too concerned, it radiated to the left arm. He describes this as very mild maybe 2 out of 10 He was been driving around 9 AM and he felt his heart racing and he felt a little faint subsequently [place] [address] and then when he felt a little better drove to [facility]. His initial workup in the ED was unremarkable namely EKG, chest x-ray, labs and troponin. Was sent to [facility] for further workup .  Review of Systems: As per HPI, all other systems reviewed and are negative  Past Medical History:  Diagnosis Date  . ALLERGIC RHINITIS 10/23/2008  . DIVERTICULOSIS, COLON 10/23/2008  . Heart murmur   . Hepatitis C   . History of blood transfusion    contracted hep C  . HYPERTENSION 10/23/2008    Past Surgical History:  Procedure Laterality Date  . ASD REPAIR  1971  . COLON SURGERY  2007   ,resection, diverticular rupture  . HERNIA REPAIR  2005  . ROTATOR CUFF REPAIR  2014   right  . VENTRAL HERNIA REPAIR  2008     reports that he has never smoked. He has never used smokeless tobacco. He reports that he drinks alcohol. He reports that he does not use drugs.  No Known Allergies  Family History  Problem Relation Age of Onset  . Alcohol abuse Other   . Heart disease Other   . Alcohol abuse Father   . Colon cancer Neg Hx   Chest  Prior to Admission  medications   Medication Sig Start Date End Date Taking? Authorizing Provider  Cholecalciferol (VITAMIN D3) 1000 UNITS CAPS Take 1,000 Units by mouth daily.    Yes Historical Provider, MD  fexofenadine (ALLEGRA) 180 MG tablet Take 180 mg by mouth daily as needed for allergies.    Yes Historical Provider, MD  fluticasone (FLONASE) 50 MCG/ACT nasal spray Place 2 sprays into the nose daily as needed for allergies.    Yes Eulas Post, MD  lisinopril-hydrochlorothiazide (PRINZIDE,ZESTORETIC) 20-12.5 MG tablet Take 1 tablet by mouth daily. 04/24/16  Yes Eulas Post, MD  naproxen sodium (ANAPROX) 220 MG tablet Take 440 mg by mouth 2 (two) times daily as needed (for pain).   Yes Historical Provider, MD  Omega-3 Fatty Acids (FISH OIL PO) Take 1 capsule by mouth daily.   Yes Historical Provider, MD  Tamsulosin HCl (FLOMAX) 0.4 MG CAPS Take 0.4 mg by mouth daily.    Yes Historical Provider, MD    Physical Exam: Vitals:   05/19/16 1100 05/19/16 1130 05/19/16 1200 05/19/16 1408  BP: 142/65 135/65 120/60 125/69  Pulse: 78 75 71 74  Resp: 19 16 18 19   Temp:   98.3 F (36.8 C) 98.5 F (36.9 C)  TempSrc:   Oral Oral  SpO2: 98% 99% 100% 100%  Weight:   86  kg (189 lb 9.5 oz)   Height:   6\' 1"  (1.854 m)       Constitutional: NAD, calm, comfortable Vitals:   05/19/16 1100 05/19/16 1130 05/19/16 1200 05/19/16 1408  BP: 142/65 135/65 120/60 125/69  Pulse: 78 75 71 74  Resp: 19 16 18 19   Temp:   98.3 F (36.8 C) 98.5 F (36.9 C)  TempSrc:   Oral Oral  SpO2: 98% 99% 100% 100%  Weight:   86 kg (189 lb 9.5 oz)   Height:   6\' 1"  (1.854 m)    Eyes: PERRL, lids and conjunctivae normal ENMT: Mucous membranes are moist. Neck: normal, supple, no masses, no thyromegaly Respiratory: clear to auscultation bilaterally, no wheezing, no crackles. Normal respiratory effort. No accessory muscle use.  Cardiovascular: Regular rate and rhythm, no murmurs / rubs / gallops. No extremity edema. 2+ pedal  pulses. Allegra 1  Abdomen: no tenderness, no masses palpated. No hepatosplenomegaly. Bowel sounds positive.  Musculoskeletal: no clubbing / cyanosis. No joint deformity upper and lower extremities. Good ROM, no contractures. Normal muscle tone.  Skin: no rashes, lesions, ulcers. No induration Neurologic: CN 2-12 grossly intact. Sensation intact, DTR normal. Strength 5/5 in all 4.  Psychiatric: Normal judgment and insight. Alert and oriented x 3. Normal mood.   CBC:  Recent Labs Lab 05/19/16 0930  WBC 5.6  HGB 16.3  HCT 45.6  MCV 85.1  PLT [id number]*   Basic Metabolic Panel:  Recent Labs Lab 05/19/16 0930  NA 138  K 3.6  CL 103  CO2 27  GLUCOSE 129*  BUN 25*  CREATININE 1.21  CALCIUM 10.0   GFR: Estimated Creatinine Clearance: 67.9 mL/min (by C-G formula based on SCr of 1.21 mg/dL). Liver Function Tests: No results for input(s): AST, ALT, ALKPHOS, BILITOT, PROT, ALBUMIN in the last 168 hours. No results for input(s): LIPASE, AMYLASE in the last 168 hours. No results for input(s): AMMONIA in the last 168 hours. Coagulation Profile: No results for input(s): INR, PROTIME in the last 168 hours. Cardiac Enzymes:  Recent Labs Lab 05/19/16 0930  TROPONINI <0.03   BNP (last 3 results) No results for input(s): PROBNP in the last 8760 hours. HbA1C: No results for input(s): HGBA1C in the last 72 hours. CBG: No results for input(s): GLUCAP in the last 168 hours. Lipid Profile: No results for input(s): CHOL, HDL, LDLCALC, TRIG, CHOLHDL, LDLDIRECT in the last 72 hours. Thyroid Function Tests: No results for input(s): TSH, T4TOTAL, FREET4, T3FREE, THYROIDAB in the last 72 hours. Anemia Panel: No results for input(s): VITAMINB12, FOLATE, FERRITIN, TIBC, IRON, RETICCTPCT in the last 72 hours. Urine analysis:    Component Value Date/Time   BILIRUBINUR n 10/04/2012 1150   PROTEINUR n 10/04/2012 1150   UROBILINOGEN 0.2 10/04/2012 1150   NITRITE n 10/04/2012 1150   LEUKOCYTESUR  Negative 10/04/2012 1150   Sepsis Labs: @LABRCNTIP (procalcitonin:4,lacticidven:4) )No results found for this or any previous visit (from the past 240 hour(s)).   Radiological Exams on Admission: Dg Chest 2 View  Result Date: 05/19/2016 CLINICAL DATA:  Chest pain. EXAM: CHEST  2 VIEW COMPARISON:  No recent prior. FINDINGS: Mediastinum hilar structures are normal. Prior median sternotomy. Borderline cardiomegaly. No pulmonary venous congestion. Low lung volumes. No pleural effusion or pneumothorax. No acute bony abnormality IMPRESSION: Borderline cardiomegaly. No pulmonary venous congestion. No focal infiltrate. Low lung volumes. Electronically Signed   By: Marcello Moores  Register   On: 05/19/2016 09:48    EKG: Independently reviewed. NSR, no acute ST T  wave changes  Assessment/Plan    Chest pain  -atypical with some typical features as well , given risk factors namely hypertension, age, strong family history -His heart score is 5  -admit to telemetry, aspirin 325 mg daily, cycle cardiac enzymes -will consult cardiology in a.m. for stress test assuming his enzymes are negative     HTN -Stable continue lisinopril    Hyperglycemia -No history of diabetes, check hemoglobin A1c -   BPH -Continue Flomax  DVT prophylaxis:lovenox Code Status:Full Code Family Communication: None at bedside Disposition Plan: Home tomorrow Consults called: sent staff msg to cardiology Admission status: observation  Domenic Polite MD [facility] Pager [phone]  If 7PM-7AM, please contact night-coverage [email or web address] Password [facility]  05/19/2016, 5:48 PM

## 2016-05-19 NOTE — ED Provider Notes (Signed)
[place] DEPT [facility] Provider Note   CSN: [id number] Arrival date & time: 05/19/16  [id number]     History   Chief Complaint Chief Complaint  Patient presents with  . Chest Pain    HPI DAVIONNE STUTZMAN is a 65 y.o. male.  HPI   Patient is a 65 year old male with history of ASD, hypertension presenting with chest pain. Patient woke up with chest pain radiating to his left arm. He went about his day and was driving when he became acutely dizzy and the chest pain/arm pain became worse. He felt symptoms of palpitations and lightheadedness. He pulled over and it resolved acute pain here. No shortness of breath or diaphoresis. Patient had no provocative/stress testing. Patient has no history of cholesterol or diabetes. He did have a father with early cardiac MI at age 3.  Past Medical History:  Diagnosis Date  . ALLERGIC RHINITIS 10/23/2008  . DIVERTICULOSIS, COLON 10/23/2008  . Heart murmur   . Hepatitis C   . History of blood transfusion    contracted hep C  . HYPERTENSION 10/23/2008    Patient Active Problem List   Diagnosis Date Noted  . Chest pain 05/19/2016  . Hepatitis C 11/06/2010  . CELLULITIS, HAND, LEFT 01/07/2010  . Essential hypertension 10/23/2008  . ALLERGIC RHINITIS 10/23/2008  . DIVERTICULOSIS, COLON 10/23/2008  . COLONIC POLYPS, HX OF 10/23/2008    Past Surgical History:  Procedure Laterality Date  . ASD REPAIR  1971  . COLON SURGERY  2007   ,resection, diverticular rupture  . HERNIA REPAIR  2005  . ROTATOR CUFF REPAIR  2014   right  . VENTRAL HERNIA REPAIR  2008       Home Medications    Prior to Admission medications   Medication Sig Start Date End Date Taking? Authorizing Provider  Cholecalciferol (VITAMIN D3) 1000 UNITS CAPS Take 1,000 Units by mouth daily.    Yes Historical Provider, MD  fexofenadine (ALLEGRA) 180 MG tablet Take 180 mg by mouth daily as needed for allergies.    Yes Historical Provider, MD  fluticasone (FLONASE) 50 MCG/ACT  nasal spray Place 2 sprays into the nose daily as needed for allergies.    Yes Eulas Post, MD  lisinopril-hydrochlorothiazide (PRINZIDE,ZESTORETIC) 20-12.5 MG tablet Take 1 tablet by mouth daily. 04/24/16  Yes Eulas Post, MD  naproxen sodium (ANAPROX) 220 MG tablet Take 440 mg by mouth 2 (two) times daily as needed (for pain).   Yes Historical Provider, MD  Omega-3 Fatty Acids (FISH OIL PO) Take 1 capsule by mouth daily.   Yes Historical Provider, MD  Tamsulosin HCl (FLOMAX) 0.4 MG CAPS Take 0.4 mg by mouth daily.    Yes Historical Provider, MD    Family History Family History  Problem Relation Age of Onset  . Alcohol abuse Other   . Heart disease Other   . Alcohol abuse Father   . Colon cancer Neg Hx     Social History Social History  Substance Use Topics  . Smoking status: Never Smoker  . Smokeless tobacco: Never Used  . Alcohol use 0.0 oz/week     Comment: occasional     Allergies   Review of patient's allergies indicates no known allergies.   Review of Systems Review of Systems  Constitutional: Negative for activity change, fatigue and fever.  Respiratory: Positive for chest tightness. Negative for shortness of breath.   Cardiovascular: Positive for chest pain and palpitations.  Gastrointestinal: Negative for abdominal pain.  Neurological: Positive  for light-headedness.  All other systems reviewed and are negative.    Physical Exam Updated Vital Signs BP 125/69 (BP Location: Left Arm)   Pulse 74   Temp 98.5 F (36.9 C) (Oral)   Resp 19   Ht 6\' 1"  (1.854 m)   Wt 189 lb 9.5 oz (86 kg)   SpO2 100%   BMI 25.01 kg/m   Physical Exam  Constitutional: He is oriented to person, place, and time. He appears well-nourished.  HENT:  Head: Normocephalic.  Eyes: Conjunctivae and EOM are normal. Pupils are equal, round, and reactive to light.  Cardiovascular: Normal rate and regular rhythm.   Pulmonary/Chest: Effort normal and breath sounds normal. No  respiratory distress. He has no wheezes.  Musculoskeletal: Normal range of motion. He exhibits no edema.  Neurological: He is oriented to person, place, and time.  Skin: Skin is warm and dry. He is not diaphoretic.  Psychiatric: He has a normal mood and affect. His behavior is normal.     ED Treatments / Results  Labs (all labs ordered are listed, but only abnormal results are displayed) Labs Reviewed  BASIC METABOLIC PANEL - Abnormal; Notable for the following:       Result Value   Glucose, Bld 129 (*)    BUN 25 (*)    All other components within normal limits  CBC - Abnormal; Notable for the following:    Platelets 127 (*)    All other components within normal limits  TROPONIN I    EKG  EKG Interpretation  Date/Time:  Monday May 19 2016 09:29:05 EDT Ventricular Rate:  88 PR Interval:    QRS Duration: 103 QT Interval:  359 QTC Calculation: 435 R Axis:   58 Text Interpretation:  Sinus rhythm Baseline wander in lead(s) V2 No significant change since last tracing Confirmed by Gerald Leitz ([id number]) on 05/19/2016 9:37:26 AM       Radiology Dg Chest 2 View  Result Date: 05/19/2016 CLINICAL DATA:  Chest pain. EXAM: CHEST  2 VIEW COMPARISON:  No recent prior. FINDINGS: Mediastinum hilar structures are normal. Prior median sternotomy. Borderline cardiomegaly. No pulmonary venous congestion. Low lung volumes. No pleural effusion or pneumothorax. No acute bony abnormality IMPRESSION: Borderline cardiomegaly. No pulmonary venous congestion. No focal infiltrate. Low lung volumes. Electronically Signed   By: Marcello Moores  Register   On: 05/19/2016 09:48    Procedures Procedures (including critical care time)  Medications Ordered in ED Medications  nitroGLYCERIN (NITROSTAT) SL tablet 0.4 mg (not administered)  aspirin chewable tablet 324 mg (324 mg Oral Given 05/19/16 1003)     Initial Impression / Assessment and Plan / ED Course  I have reviewed the triage vital signs and  the nursing notes.  Pertinent labs & imaging results that were available during my care of the patient were reviewed by me and considered in my medical decision making (see chart for details).  Clinical Course   Patient a pleasant 65 year old male with past medical history significant for hypertension, ASD, early cardiac death in his family presenting today with chest pain. The chest pain radiated to left arm and associated with lightheadedness. This concerning for ischemic chest pain. We'll get initial troponin and workup. Patient's heart score is 5. Will plan to admit for chest pain rule out. No risk factors for pulmonary embolism. Does not radiate to back therefore do not suspect dissection.  Initail troponin negative, EKG non ischemic, will admit for chest pain rule out, potentially provocative testing.   Final  Clinical Impressions(s) / ED Diagnoses   Final diagnoses:  None    New Prescriptions Current Discharge Medication List       Dennys Traughber Julio Alm, MD 05/19/16 1431

## 2016-05-20 ENCOUNTER — Other Ambulatory Visit: Payer: Self-pay | Admitting: Physician Assistant

## 2016-05-20 DIAGNOSIS — R0782 Intercostal pain: Secondary | ICD-10-CM | POA: Diagnosis not present

## 2016-05-20 DIAGNOSIS — I471 Supraventricular tachycardia: Secondary | ICD-10-CM | POA: Diagnosis not present

## 2016-05-20 DIAGNOSIS — I10 Essential (primary) hypertension: Secondary | ICD-10-CM | POA: Diagnosis not present

## 2016-05-20 DIAGNOSIS — R0789 Other chest pain: Secondary | ICD-10-CM

## 2016-05-20 DIAGNOSIS — R002 Palpitations: Secondary | ICD-10-CM

## 2016-05-20 LAB — TROPONIN I: Troponin I: <0.03 ng/mL (ref <0.03)

## 2016-05-20 LAB — HEMOGLOBIN A1C
HEMOGLOBIN A1C: 5.4 % (ref 4.8–5.6)
Mean Plasma Glucose: 108 mg/dL

## 2016-05-20 LAB — TSH: TSH: 2.638 u[IU]/mL (ref 0.350–4.500)

## 2016-05-20 NOTE — Consult Note (Signed)
CARDIOLOGY CONSULT NOTE   Patient ID: Jermaine Brady MRN: [id number] DOB/AGE: 66-Sep-1952 66 y.o.  Admit date: 05/19/2016  Primary Physician   Eulas Post, MD Primary Cardiologist   New Reason for Consultation   Chest pain Requesting Physician  Dr. Eliseo Squires  HPI: Jermaine Brady is a 65 y.o. male with a history of HTN, ASD s/p repair at age 50 (hasn't seen cardiologist since then), BPH and hepatitis C who presented for evaluation of chest pain.   The patient was in [place] up-until yesterday morning around 7am when he had  achy chest pain underneath left axilla that radiated to left arm. The patient has severe chronic neck pain due to arthritis and felt due to this. He took some Aleve with minimal relief. While driving to work around 9 am, He noticed more pronounced achy left-sided chest pain followed by "heart racing" along with lightheadedness. No other associated symptoms. His house resting lasted for less than 1 minute. He drove to [employer] and transported here for further evaluation.  EKG shows normal sinus rhythm at rate of 88 bpm. Chest x-ray shows cardiomegaly without acute cardiopulmonary disease. Troponin negative x 3. Electrolytes and kidney function normal. Low platelets. Hemoglobin A1c 5.4.  Patient had a intermittent episode of achy chest pain yesterday. Currently denies any chest pain. Denies any history of illicit drug use or tobacco abuse. He drinks beer/wine 1 glass once every week. 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 Patient denies orthopnea, PND, syncope, lower extremity edema, abdominal pain, melena or blood in his stool or urine.   Past Medical History:  Diagnosis Date  . ALLERGIC RHINITIS 10/23/2008  . Arthritis    "neck" (05/19/2016)  . DIVERTICULOSIS, COLON 10/23/2008  . Heart murmur   . Hepatitis C    "don't know when I contracted this; went thru treatment in ~ 2007-2008"  . History of blood  transfusion 2008?   "during tx to cure my Hep C"  . History of kidney stones   . HYPERTENSION 10/23/2008     Past Surgical History:  Procedure Laterality Date  . ABDOMINAL HERNIA REPAIR  2005  . ASD REPAIR  1971  . CARDIAC CATHETERIZATION  1960s - 1970s X 2   "at [facility]"  . COLON SURGERY  2007   ,resection, diverticular rupture  . HERNIA REPAIR  2005  . SHOULDER ARTHROSCOPY W/ ROTATOR CUFF REPAIR Right 2014    No Known Allergies  I have reviewed the patient's current medications . aspirin  324 mg Oral Daily  . enoxaparin (LOVENOX) injection  40 mg Subcutaneous Q24H  . lisinopril  20 mg Oral Daily   And  . hydrochlorothiazide  12.5 mg Oral Daily  . Influenza vac split quadrivalent PF  0.5 mL Intramuscular Tomorrow-1000  . pneumococcal 23 valent vaccine  0.5 mL Intramuscular Tomorrow-1000  . tamsulosin  0.4 mg Oral Daily     acetaminophen **OR** acetaminophen, nitroGLYCERIN, ondansetron **OR** ondansetron (ZOFRAN) IV  Prior to Admission medications   Medication Sig Start Date End Date Taking? Authorizing Provider  Cholecalciferol (VITAMIN D3) 1000 UNITS CAPS Take 1,000 Units by mouth daily.    Yes Historical Provider, MD  fexofenadine (ALLEGRA) 180 MG tablet Take 180 mg by mouth daily as needed for allergies.    Yes Historical Provider, MD  fluticasone (FLONASE) 50 MCG/ACT nasal spray Place 2 sprays into the nose daily as needed for allergies.    Yes Eulas Post,  MD  lisinopril-hydrochlorothiazide (PRINZIDE,ZESTORETIC) 20-12.5 MG tablet Take 1 tablet by mouth daily. 04/24/16  Yes Eulas Post, MD  naproxen sodium (ANAPROX) 220 MG tablet Take 440 mg by mouth 2 (two) times daily as needed (for pain).   Yes Historical Provider, MD  Omega-3 Fatty Acids (FISH OIL PO) Take 1 capsule by mouth daily.   Yes Historical Provider, MD  Tamsulosin HCl (FLOMAX) 0.4 MG CAPS Take 0.4 mg by mouth daily.    Yes Historical Provider, MD     Social History   Social History  . Marital  status: Married    Spouse name: N/A  . Number of children: N/A  . Years of education: N/A   Occupational History  . Not on file.   Social History Main Topics  . Smoking status: Never Smoker  . Smokeless tobacco: Never Used  . Alcohol use 1.2 oz/week    2 Cans of beer per week  . Drug use: No  . Sexual activity: Yes   Other Topics Concern  . Not on file   Social History Narrative  . No narrative on file    Family Status  Relation Status  . Other Alive  . Father   . Neg Hx    Family History  Problem Relation Age of Onset  . Alcohol abuse Other   . Heart disease Other   . Alcohol abuse Father   . Colon cancer Neg Hx     ROS:  Full 14 point review of systems complete and found to be negative unless listed above.  Physical Exam: Blood pressure 119/72, pulse 62, temperature 97.5 F (36.4 C), temperature source Oral, resp. rate 16, height 6\' 1"  (1.854 m), weight 187 lb 6.4 oz (85 kg), SpO2 97 %.  General: Well developed, well nourished, male in no acute distress Head: Eyes PERRLA, No xanthomas. Normocephalic and atraumatic, oropharynx without edema or exudate.  Lungs: Resp regular and unlabored, CTA. Heart: RRR no s3, s4, or murmurs. Non tender to palpation.  Neck: No carotid bruits. No lymphadenopathy.  No JVD. Abdomen: Bowel sounds present, abdomen soft and non-tender without masses or hernias noted. Msk:  No spine or cva tenderness. No weakness, no joint deformities or effusions. Extremities: No clubbing, cyanosis or edema. DP/PT/Radials 2+ and equal bilaterally. Neuro: Alert and oriented X 3. No focal deficits noted. Psych:  Good affect, responds appropriately Skin: No rashes or lesions noted.  Labs:   Lab Results  Component Value Date   WBC 6.5 05/19/2016   HGB 15.8 05/19/2016   HCT 45.3 05/19/2016   MCV 85.5 05/19/2016   PLT 137 (L) 05/19/2016   No results for input(s): INR in the last 72 hours.  Recent Labs Lab 05/19/16 0930 05/19/16 1923  NA 138   --   K 3.6  --   CL 103  --   CO2 27  --   BUN 25*  --   CREATININE 1.21 1.15  CALCIUM 10.0  --   GLUCOSE 129*  --    No results found for: MG  Recent Labs  05/19/16 0930 05/19/16 1923 05/19/16 2342  TROPONINI <0.03 <0.03 <0.03   No results for input(s): TROPIPOC in the last 72 hours. No results found for: PROBNP Lab Results  Component Value Date   CHOL 109 12/15/2014   HDL 28.80 (L) 12/15/2014   LDLCALC 67 12/15/2014   TRIG 64.0 12/15/2014   No results found for: DDIMER No results found for: LIPASE, AMYLASE TSH  Date/Time Value  Ref Range Status  12/15/2014 09:33 AM 1.66 0.35 - 4.50 uIU/mL Final   No results found for: VITAMINB12, FOLATE, FERRITIN, TIBC, IRON, RETICCTPCT  Radiology:  Dg Chest 2 View  Result Date: 05/19/2016 CLINICAL DATA:  Chest pain. EXAM: CHEST  2 VIEW COMPARISON:  No recent prior. FINDINGS: Mediastinum hilar structures are normal. Prior median sternotomy. Borderline cardiomegaly. No pulmonary venous congestion. Low lung volumes. No pleural effusion or pneumothorax. No acute bony abnormality IMPRESSION: Borderline cardiomegaly. No pulmonary venous congestion. No focal infiltrate. Low lung volumes. Electronically Signed   By: Marcello Moores  Register   On: 05/19/2016 09:48    ASSESSMENT AND PLAN:     1. Chest pain - His chest pain is atypical. Patient has ruled out. Troponin negative. EKG nonischemic. Try Tylenol/NSAID for likely musculoskeletal pain. Will do outpatient exercise Myoview. He can be discharged on cardiac stand point.   2. Palpitation - Brief episode occurred while driving yesterday. No reoccurrence. Denies prior history. He drinks one cup of coffee every day. No arrhythmia noted on telemetry. Will get 48 hour Holter monitor. Check TSH.  3. HTN - Stable and well controlled. Continue current regimen.  SignedLeanor Kail, PA 05/20/2016, 8:00 AM Pager [phone]  Co-Sign MD  The patient has been seen in conjunction with Vin Bhagat,  PA-C. All aspects of care have been considered and discussed. The patient has been personally interviewed, examined, and all clinical data has been reviewed.   Vague and very atypical left shoulder and arm and upper chest pain still lingering today. This is greater than 24 hours since he noticed it. Cardiac markers are negative. EKG is normal. Chest and arm discomfort are unlikely cardiac related. Plan excise treadmill test as an outpatient.  Episode of palpitations and lightheadedness while driving or concerning. Episode lasted less than 1 minute. Plan 48 hour Holter monitor as an outpatient. No significant abnormalities noted on monitor overnight.  Okay to discharge for outpatient workup. Discussed this with the patient and he is in agreement.

## 2016-05-20 NOTE — Discharge Summary (Signed)
Physician Discharge Summary  GABE GENSEMER [id number] DOB: [birth year] DOA: 05/19/2016  PCP: Eulas Post, MD  Admit date: 05/19/2016 Discharge date: 05/20/2016   Recommendations for Outpatient Follow-Up:   1. Outpatient stress test arranged   Discharge Diagnosis:   Active Problems:   Essential hypertension   Chest pain   Discharge disposition:  Home  Discharge Condition: Improved.  Diet recommendation: Low sodium, heart healthy  Wound care: None.   History of Present Illness:   Jermaine Brady is a 65 y.o. male with medical history significant for hypertension, BPH, remote history of ASD status post repair as a child, presents to the ER at [facility] today with left-sided chest pain. Patient reports feeling well after he woke up later in the morning he noticed a mild ache in the left side of his chest, this was similar to musculoskeletal pains that he experiences in his upper neck, hence didn't feel too concerned, it radiated to the left arm. He describes this as very mild maybe 2 out of 10 He was been driving around 9 AM and he felt his heart racing and he felt a little faint subsequently [place] [address] and then when he felt a little better drove to [facility]. His initial workup in the ED was unremarkable namely EKG, chest x-ray, labs and troponin. Was sent to [facility] for further workup .   Hospital Course by Problem:   Chest pain -CE negative -outpatient stress test -atypical chest pain  Palpitations -48 hours holter monitor arranged by cards   Medical Consultants:    cards   Discharge Exam:   Vitals:   05/19/16 2038 05/20/16 0500  BP: 131/78 119/72  Pulse: 66 62  Resp: 16   Temp: 98.6 F (37 C) 97.5 F (36.4 C)   Vitals:   05/19/16 1408 05/19/16 1919 05/19/16 2038 05/20/16 0500  BP: 125/69 (!) 141/68 131/78 119/72  Pulse: 74  66 62  Resp: 19  16   Temp: 98.5 F (36.9 C)  98.6 F (37 C)  97.5 F (36.4 C)  TempSrc: Oral  Oral Oral  SpO2: 100%  97% 97%  Weight:    85 kg (187 lb 6.4 oz)  Height:        Gen:  NAD    The results of significant diagnostics from this hospitalization (including imaging, microbiology, ancillary and laboratory) are listed below for reference.     Procedures and Diagnostic Studies:   Dg Chest 2 View  Result Date: 05/19/2016 CLINICAL DATA:  Chest pain. EXAM: CHEST  2 VIEW COMPARISON:  No recent prior. FINDINGS: Mediastinum hilar structures are normal. Prior median sternotomy. Borderline cardiomegaly. No pulmonary venous congestion. Low lung volumes. No pleural effusion or pneumothorax. No acute bony abnormality IMPRESSION: Borderline cardiomegaly. No pulmonary venous congestion. No focal infiltrate. Low lung volumes. Electronically Signed   By: Marcello Moores  Register   On: 05/19/2016 09:48     Labs:   Basic Metabolic Panel:  Recent Labs Lab 05/19/16 0930 05/19/16 1923  NA 138  --   K 3.6  --   CL 103  --   CO2 27  --   GLUCOSE 129*  --   BUN 25*  --   CREATININE 1.21 1.15  CALCIUM 10.0  --    GFR Estimated Creatinine Clearance: 71.4 mL/min (by C-G formula based on SCr of 1.15 mg/dL). Liver Function Tests: No results for input(s): AST, ALT, ALKPHOS, BILITOT, PROT, ALBUMIN in  the last 168 hours. No results for input(s): LIPASE, AMYLASE in the last 168 hours. No results for input(s): AMMONIA in the last 168 hours. Coagulation profile No results for input(s): INR, PROTIME in the last 168 hours.  CBC:  Recent Labs Lab 05/19/16 0930 05/19/16 1923  WBC 5.6 6.5  HGB 16.3 15.8  HCT 45.6 45.3  MCV 85.1 85.5  PLT 127* 137*   Cardiac Enzymes:  Recent Labs Lab 05/19/16 0930 05/19/16 1923 05/19/16 2342  TROPONINI <0.03 <0.03 <0.03   BNP: Invalid input(s): POCBNP CBG: No results for input(s): GLUCAP in the last 168 hours. D-Dimer No results for input(s): DDIMER in the last 72 hours. Hgb A1c  Recent Labs  05/19/16 1923    HGBA1C 5.4   Lipid Profile No results for input(s): CHOL, HDL, LDLCALC, TRIG, CHOLHDL, LDLDIRECT in the last 72 hours. Thyroid function studies No results for input(s): TSH, T4TOTAL, T3FREE, THYROIDAB in the last 72 hours.  Invalid input(s): FREET3 Anemia work up No results for input(s): VITAMINB12, FOLATE, FERRITIN, TIBC, IRON, RETICCTPCT in the last 72 hours. Microbiology No results found for this or any previous visit (from the past 240 hour(s)).   Discharge Instructions:   Discharge Instructions    Diet - low sodium heart healthy    Complete by:  As directed    Discharge instructions    Complete by:  As directed    Outpatient cardiac work up   Increase activity slowly    Complete by:  As directed        Medication List    TAKE these medications   fexofenadine 180 MG tablet Commonly known as:  ALLEGRA Take 180 mg by mouth daily as needed for allergies.   FISH OIL PO Take 1 capsule by mouth daily.   FLOMAX 0.4 MG Caps capsule Generic drug:  tamsulosin Take 0.4 mg by mouth daily.   fluticasone 50 MCG/ACT nasal spray Commonly known as:  FLONASE Place 2 sprays into the nose daily as needed for allergies.   lisinopril-hydrochlorothiazide 20-12.5 MG tablet Commonly known as:  PRINZIDE,ZESTORETIC Take 1 tablet by mouth daily.   naproxen sodium 220 MG tablet Commonly known as:  ANAPROX Take 440 mg by mouth 2 (two) times daily as needed (for pain).   Vitamin D3 1000 units Caps Take 1,000 Units by mouth daily.      Follow-up Information    [facility] [employer]. Go on 05/27/2016.   Specialty:  Cardiology Why:  7:15 am for stress test (present at 7am)   Will call about monitor placement time and date Contact information: [address] [place]       Angelena Form, PA-C. Go on 06/02/2016.   Specialties:  Cardiology, Radiology Why:  @ 9:30 for hospital follow up Contact  information: [place] [address] [phone]            Time coordinating discharge: 35 min  Signed:  Tawnya Pujol U Schawn Byas   [facility] 05/20/2016, 10:35 AM

## 2016-05-20 NOTE — Care Management Obs Status (Signed)
[place] NOTIFICATION   Patient Details  Name: Jermaine Brady MRN: [id number] Date of Birth: 04-15-1951   Medicare Observation Status Notification Given:  Yes    Bethena Roys, RN 05/20/2016, 10:57 AM

## 2016-05-21 ENCOUNTER — Telehealth: Payer: Self-pay

## 2016-05-21 NOTE — Telephone Encounter (Signed)
D/C 05/20/16 To: home Pt declined TCM visit. States that he will call in the next few weeks to schedule annual physical.    Transition Care Management Follow-up Telephone Call  How have you been since you were released from the hospital? yes   Do you understand why you were in the hospital? yes   Do you understand the discharge instrcutions? yes  Items Reviewed:  Medications reviewed: yes  Allergies reviewed: yes  Dietary changes reviewed: yes  Referrals reviewed: yes   Functional Questionnaire:   Activities of Daily Living (ADLs):   He states they are independent in the following: ambulation, bathing and hygiene, feeding, continence, grooming, toileting and dressing States they require assistance with the following: none   Any transportation issues/concerns?: no   Any patient concerns? no   Confirmed importance and date/time of follow-up visits scheduled: yes   Confirmed with patient if condition begins to worsen call PCP or go to the ER.  Patient was given the Call-a-Nurse line [phone]: yes

## 2016-05-22 ENCOUNTER — Telehealth (HOSPITAL_COMMUNITY): Payer: Self-pay | Admitting: *Deleted

## 2016-05-22 NOTE — Telephone Encounter (Signed)
Patient given detailed instructions per Myocardial Perfusion Study Information Sheet for the test on 05/27/16. Patient notified to arrive 15 minutes early and that it is imperative to arrive on time for appointment to keep from having the test rescheduled.  If you need to cancel or reschedule your appointment, please call the office within 24 hours of your appointment. Failure to do so may result in a cancellation of your appointment, and a $50 no show fee. Patient verbalized understanding.  Jermaine Brady    

## 2016-05-27 ENCOUNTER — Ambulatory Visit (HOSPITAL_COMMUNITY): Payer: PPO | Attending: Cardiology

## 2016-05-27 DIAGNOSIS — R002 Palpitations: Secondary | ICD-10-CM | POA: Diagnosis not present

## 2016-05-27 DIAGNOSIS — Z8774 Personal history of (corrected) congenital malformations of heart and circulatory system: Secondary | ICD-10-CM | POA: Insufficient documentation

## 2016-05-27 DIAGNOSIS — R0789 Other chest pain: Secondary | ICD-10-CM

## 2016-05-27 DIAGNOSIS — H40013 Open angle with borderline findings, low risk, bilateral: Secondary | ICD-10-CM | POA: Diagnosis not present

## 2016-05-27 DIAGNOSIS — R9439 Abnormal result of other cardiovascular function study: Secondary | ICD-10-CM | POA: Diagnosis not present

## 2016-05-27 DIAGNOSIS — R079 Chest pain, unspecified: Secondary | ICD-10-CM | POA: Insufficient documentation

## 2016-05-27 DIAGNOSIS — H2513 Age-related nuclear cataract, bilateral: Secondary | ICD-10-CM | POA: Diagnosis not present

## 2016-05-27 DIAGNOSIS — I10 Essential (primary) hypertension: Secondary | ICD-10-CM | POA: Diagnosis not present

## 2016-05-27 LAB — MYOCARDIAL PERFUSION IMAGING
CHL CUP NUCLEAR SDS: 1
CHL CUP NUCLEAR SRS: 1
CHL CUP RESTING HR STRESS: 71 {beats}/min
CSEPEDS: 1 s
CSEPEW: 8.5 METS
CSEPHR: 92 %
CSEPPHR: 141 {beats}/min
Exercise duration (min): 7 min
LHR: 0.25
LV dias vol: 91 mL (ref 62–150)
LVSYSVOL: 30 mL
MPHR: 154 {beats}/min
RPE: 17
SSS: 2
TID: 0.76

## 2016-05-27 MED ORDER — TECHNETIUM TC 99M TETROFOSMIN IV KIT
31.7000 | PACK | Freq: Once | INTRAVENOUS | Status: AC | PRN
  Administered 2016-05-27: 31.7 via INTRAVENOUS
  Filled 2016-05-27: qty 32

## 2016-05-27 MED ORDER — TECHNETIUM TC 99M TETROFOSMIN IV KIT
10.5000 | PACK | Freq: Once | INTRAVENOUS | Status: AC | PRN
  Administered 2016-05-27: 10.5 via INTRAVENOUS
  Filled 2016-05-27: qty 11

## 2016-05-28 NOTE — Progress Notes (Signed)
Cardiology Office Note    Date:  06/02/2016   ID:  Jermaine Brady, DOB 02-08-1951, MRN [id number]  PCP:  Eulas Post, MD  Cardiologist:  Dr. Tamala Julian  CC: post hospital follow up  History of Present Illness:  Jermaine Brady is a 65 y.o. male with a history of HTN, ASD s/p repair at age 25 (hasn't seen cardiologist since then), BPH and hepatitis C who presents to clinic for post hospital follow up.  Recently admitted 10/23-10/24/17 for chest pain and palpitations. EKG showed normal sinus rhythm at rate of 88 bpm. Chest x-ray shows cardiomegaly without acute cardiopulmonary disease. Troponin negative x 3. He was seen in consult by Dr. Tamala Julian and plan was for discharge with outpatient stress test and 48 hours holter monitor.  He did do the stress test on 05/27/16 which was overall low risk with a medium defect of mild severity present in the basal inferior location. Disccused with Dr. Marlou Porch who feels we should start medical therapy for CAD and watch symptoms.   Today he presents to clinic for follow up. He has not had anymore chest pain. The chest pain felt more like a muscle ache vs cardiac chest pain. A little bit off and on since then but nothing that bothers him too much . He has not had anymore palpitations. He got a cold last week and never picked up his heart monitor. He doesn't think he needs to wear the monitor. No LE edema, orthopnea or PND. No dizziness or syncope.    Past Medical History:  Diagnosis Date  . ALLERGIC RHINITIS 10/23/2008  . Arthritis    "neck" (05/19/2016)  . DIVERTICULOSIS, COLON 10/23/2008  . Heart murmur   . Hepatitis C    "don't know when I contracted this; went thru treatment in ~ 2007-2008"  . History of blood transfusion 2008?   "during tx to cure my Hep C"  . History of kidney stones   . HYPERTENSION 10/23/2008    Past Surgical History:  Procedure Laterality Date  . ABDOMINAL HERNIA REPAIR  2005  . ASD REPAIR  1971  . CARDIAC  CATHETERIZATION  1960s - 1970s X 2   "at [facility]"  . COLON SURGERY  2007   ,resection, diverticular rupture  . HERNIA REPAIR  2005  . SHOULDER ARTHROSCOPY W/ ROTATOR CUFF REPAIR Right 2014    Current Medications: Outpatient Medications Prior to Visit  Medication Sig Dispense Refill  . Cholecalciferol (VITAMIN D3) 1000 UNITS CAPS Take 1,000 Units by mouth daily.     . fexofenadine (ALLEGRA) 180 MG tablet Take 180 mg by mouth daily as needed for allergies.     . fluticasone (FLONASE) 50 MCG/ACT nasal spray Place 2 sprays into the nose daily as needed for allergies.     Marland Kitchen lisinopril-hydrochlorothiazide (PRINZIDE,ZESTORETIC) 20-12.5 MG tablet Take 1 tablet by mouth daily. 90 tablet 0  . naproxen sodium (ANAPROX) 220 MG tablet Take 440 mg by mouth 2 (two) times daily as needed (for pain).    . Omega-3 Fatty Acids (FISH OIL PO) Take 1 capsule by mouth daily.    . Tamsulosin HCl (FLOMAX) 0.4 MG CAPS Take 0.4 mg by mouth daily.      No facility-administered medications prior to visit.      Allergies:   Patient has no known allergies.   Social History   Social History  . Marital status: Married    Spouse name: N/A  . Number of children: N/A  . Years of  education: N/A   Social History Main Topics  . Smoking status: Never Smoker  . Smokeless tobacco: Never Used  . Alcohol use 1.2 oz/week    2 Cans of beer per week  . Drug use: No  . Sexual activity: Yes   Other Topics Concern  . None   Social History Narrative  . None     Family History:  The patient's family history includes Alcohol abuse in his father and other; Heart disease in his other.     ROS:   Please see the history of present illness.    ROS All other systems reviewed and are negative.   PHYSICAL EXAM:   VS:  BP 120/60 (BP Location: Left Arm, Patient Position: Sitting, Cuff Size: Normal)   Pulse 78   Ht 6\' 1"  (1.854 m)   Wt 188 lb (85.3 kg)   BMI 24.80 kg/m    GEN: Well nourished, well developed, in no acute  distress  HEENT: normal  Neck: no JVD, carotid bruits, or masses Cardiac: RRR; no murmurs, rubs, or gallops,no edema  Respiratory:  clear to auscultation bilaterally, normal work of breathing GI: soft, nontender, nondistended, + BS MS: no deformity or atrophy  Skin: warm and dry, no rash Neuro:  Alert and Oriented x 3, Strength and sensation are intact Psych: euthymic mood, full affect  Wt Readings from Last 3 Encounters:  06/02/16 188 lb (85.3 kg)  05/27/16 187 lb (84.8 kg)  05/20/16 187 lb 6.4 oz (85 kg)      Studies/Labs Reviewed:   EKG:  EKG is NOT  ordered today.   Recent Labs: 05/19/2016: BUN 25; Creatinine, Ser 1.15; Hemoglobin 15.8; Platelets 137; Potassium 3.6; Sodium 138 05/20/2016: TSH 2.638   Lipid Panel    Component Value Date/Time   CHOL 109 12/15/2014 0933   TRIG 64.0 12/15/2014 0933   HDL 28.80 (L) 12/15/2014 0933   CHOLHDL 4 12/15/2014 0933   VLDL 12.8 12/15/2014 0933   LDLCALC 67 12/15/2014 0933    Additional studies/ records that were reviewed today include:  05/27/16 Myoview Study Highlights    Nuclear stress EF: 67%. No wall motion abnormality  There was no ST segment deviation noted during stress.  Defect 1: There is a medium defect of mild severity present in the basal inferior location.  This is a low risk study (inferior wall defect).  Findings consistent with ischemia.        ASSESSMENT & PLAN:   Chest pain: nuc overall low risk but some possible mild inferior wall ischemia. Will start him on medical therapy (ASA, he does not want to start a statin or BB at this time) and continue to monitor. If chest pain returns or worsens he will call us and we will arrange further testing.   Palpitations:  He has not had any further episodes. Does not think he needs a monitor at this point.    HTN: BP well controlled on current reigmen  ASD s/p repair: no murmur on exam   Medication Adjustments/Labs and Tests Ordered: Current medicines  are reviewed at length with the patient today.  Concerns regarding medicines are outlined above.  Medication changes, Labs and Tests ordered today are listed in the Patient Instructions below. Patient Instructions  Medication Instructions:  Your physician has recommended you make the following change in your medication:  1-START taking Aspirin 81 mg by mouth daily  Labwork: NONE  Testing/Procedures: NONE  Follow-Up: Your physician wants you to follow-up in: 4  months with Dr. Tamala Julian.   If you need a refill on your cardiac medications before your next appointment, please call your pharmacy.       Signed, Angelena Form, PA-C  06/02/2016 6:46 PM    [facility] [place], [place], [place]  [address] Phone: [phone]; Fax: [phone]

## 2016-06-02 ENCOUNTER — Ambulatory Visit: Payer: PPO | Admitting: Physician Assistant

## 2016-06-02 ENCOUNTER — Encounter: Payer: Self-pay | Admitting: Physician Assistant

## 2016-06-02 ENCOUNTER — Ambulatory Visit (INDEPENDENT_AMBULATORY_CARE_PROVIDER_SITE_OTHER): Payer: PPO | Admitting: Physician Assistant

## 2016-06-02 VITALS — BP 120/60 | HR 78 | Ht 73.0 in | Wt 188.0 lb

## 2016-06-02 DIAGNOSIS — Z9889 Other specified postprocedural states: Secondary | ICD-10-CM

## 2016-06-02 DIAGNOSIS — I10 Essential (primary) hypertension: Secondary | ICD-10-CM | POA: Diagnosis not present

## 2016-06-02 DIAGNOSIS — R079 Chest pain, unspecified: Secondary | ICD-10-CM

## 2016-06-02 DIAGNOSIS — R002 Palpitations: Secondary | ICD-10-CM

## 2016-06-02 DIAGNOSIS — Z8774 Personal history of (corrected) congenital malformations of heart and circulatory system: Secondary | ICD-10-CM

## 2016-06-02 MED ORDER — ASPIRIN EC 81 MG PO TBEC: 81.0000 mg | DELAYED_RELEASE_TABLET | Freq: Every day | ORAL | 3 refills | Status: DC

## 2016-06-02 NOTE — Patient Instructions (Signed)
Medication Instructions:  Your physician has recommended you make the following change in your medication:  1-START taking Aspirin 81 mg by mouth daily  Labwork: NONE  Testing/Procedures: NONE  Follow-Up: Your physician wants you to follow-up in: 4  months with Dr. Tamala Julian.   If you need a refill on your cardiac medications before your next appointment, please call your pharmacy.

## 2016-06-03 ENCOUNTER — Ambulatory Visit (INDEPENDENT_AMBULATORY_CARE_PROVIDER_SITE_OTHER): Payer: PPO | Admitting: Family Medicine

## 2016-06-03 ENCOUNTER — Encounter: Payer: Self-pay | Admitting: Family Medicine

## 2016-06-03 VITALS — BP 140/70 | HR 97 | Temp 97.8°F | Ht 73.0 in | Wt 190.1 lb

## 2016-06-03 DIAGNOSIS — R05 Cough: Secondary | ICD-10-CM

## 2016-06-03 DIAGNOSIS — R059 Cough, unspecified: Secondary | ICD-10-CM

## 2016-06-03 MED ORDER — HYDROCODONE-HOMATROPINE 5-1.5 MG/5ML PO SYRP: 5.0000 mL | ORAL_SOLUTION | Freq: Four times a day (QID) | ORAL | 0 refills | Status: DC | PRN

## 2016-06-03 MED ORDER — HYDROCODONE-HOMATROPINE 5-1.5 MG/5ML PO SYRP: 5.0000 mL | ORAL_SOLUTION | Freq: Four times a day (QID) | ORAL | 0 refills | Status: AC | PRN

## 2016-06-03 NOTE — Progress Notes (Signed)
Subjective:     Patient ID: Jermaine Brady, male   DOB: 23-Mar-1951, 65 y.o.   MRN: [id number]  HPI Patient seen with a day history of cough and congestion. Recent history is that he had episode of palpitation and possible tachyarrhythmia and chest discomfort. Ruled out for MI. Nuclear stress test showed mild inferior defect but overall low risk study. Patient started on aspirin but declined beta blocker or statin use. His cholesterol year ago was 109 total and LDL 67. He's had no further chest pain. Cough initially productive and now mostly dry. No fever. No chills. No facial pain. No sore throat. No dyspnea. Nonsmoker.  Past Medical History:  Diagnosis Date  . ALLERGIC RHINITIS 10/23/2008  . Arthritis    "neck" (05/19/2016)  . DIVERTICULOSIS, COLON 10/23/2008  . Heart murmur   . Hepatitis C    "don't know when I contracted this; went thru treatment in ~ 2007-2008"  . History of blood transfusion 2008?   "during tx to cure my Hep C"  . History of kidney stones   . HYPERTENSION 10/23/2008   Past Surgical History:  Procedure Laterality Date  . ABDOMINAL HERNIA REPAIR  2005  . ASD REPAIR  1971  . CARDIAC CATHETERIZATION  1960s - 1970s X 2   "at [facility]"  . COLON SURGERY  2007   ,resection, diverticular rupture  . HERNIA REPAIR  2005  . SHOULDER ARTHROSCOPY W/ ROTATOR CUFF REPAIR Right 2014    reports that he has never smoked. He has never used smokeless tobacco. He reports that he drinks about 1.2 oz of alcohol per week . He reports that he does not use drugs. family history includes Alcohol abuse in his father and other; Heart disease in his other. No Known Allergies   Review of Systems  Constitutional: Positive for fatigue. Negative for chills and fever.  HENT: Positive for congestion.   Respiratory: Positive for cough. Negative for shortness of breath and wheezing.   Cardiovascular: Negative for chest pain.       Objective:   Physical Exam  Constitutional: He appears  well-developed and well-nourished.  HENT:  Right Ear: External ear normal.  Left Ear: External ear normal.  Mouth/Throat: Oropharynx is clear and moist.  Neck: Neck supple.  Cardiovascular: Normal rate and regular rhythm.   Pulmonary/Chest: Effort normal and breath sounds normal. No respiratory distress. He has no wheezes. He has no rales.  Lymphadenopathy:    He has no cervical adenopathy.       Assessment:     Cough. Suspect acute viral process.    Plan:     -No indication for antibiotics at this time -Follow-up promptly for any fever or worsening symptoms -Hycodan cough syrup 1 teaspoon daily at bedtime for severe cough 120 mL's with no refill  Eulas Post MD [facility]

## 2016-06-03 NOTE — Progress Notes (Signed)
Pre visit review using our clinic review tool, if applicable. No additional management support is needed unless otherwise documented below in the visit note. 

## 2016-06-03 NOTE — Patient Instructions (Signed)

## 2016-07-02 ENCOUNTER — Encounter: Payer: Self-pay | Admitting: Family Medicine

## 2016-07-02 ENCOUNTER — Ambulatory Visit (INDEPENDENT_AMBULATORY_CARE_PROVIDER_SITE_OTHER): Payer: PPO | Admitting: Family Medicine

## 2016-07-02 VITALS — BP 138/58 | HR 93 | Temp 97.4°F | Wt 187.6 lb

## 2016-07-02 DIAGNOSIS — R05 Cough: Secondary | ICD-10-CM

## 2016-07-02 DIAGNOSIS — R059 Cough, unspecified: Secondary | ICD-10-CM

## 2016-07-02 MED ORDER — BENZONATATE 200 MG PO CAPS: 200.0000 mg | ORAL_CAPSULE | Freq: Three times a day (TID) | ORAL | 0 refills | Status: DC | PRN

## 2016-07-02 MED ORDER — AZELASTINE HCL 0.1 % NA SOLN: 2.0000 | Freq: Two times a day (BID) | NASAL | 12 refills | Status: DC

## 2016-07-02 NOTE — Progress Notes (Signed)
Subjective:     Patient ID: Jermaine Brady, male   DOB: 15-Feb-1951, 65 y.o.   MRN: [id number]  HPI Patient seen with about one month history of cough. Started with typical URI type symptoms. He had some intermittent nasal congestion. He does have perennial allergies and takes as needed Flonase and antihistamines as needed. His cough is mostly dry. No appetite changes. No fevers or chills. No hemoptysis. No pleuritic pain. Patient had chest x-ray October 23 of this year which showed no acute findings. No history of asthma. No wheezing. No GERD symptoms. Nonsmoker. He does take ACE inhibitor with lisinopril but has been on this for many years.  Past Medical History:  Diagnosis Date  . ALLERGIC RHINITIS 10/23/2008  . Arthritis    "neck" (05/19/2016)  . DIVERTICULOSIS, COLON 10/23/2008  . Heart murmur   . Hepatitis C    "don't know when I contracted this; went thru treatment in ~ 2007-2008"  . History of blood transfusion 2008?   "during tx to cure my Hep C"  . History of kidney stones   . HYPERTENSION 10/23/2008   Past Surgical History:  Procedure Laterality Date  . ABDOMINAL HERNIA REPAIR  2005  . ASD REPAIR  1971  . CARDIAC CATHETERIZATION  1960s - 1970s X 2   "at [facility]"  . COLON SURGERY  2007   ,resection, diverticular rupture  . HERNIA REPAIR  2005  . SHOULDER ARTHROSCOPY W/ ROTATOR CUFF REPAIR Right 2014    reports that he has never smoked. He has never used smokeless tobacco. He reports that he drinks about 1.2 oz of alcohol per week . He reports that he does not use drugs. family history includes Alcohol abuse in his father and other; Heart disease in his other. No Known Allergies   Review of Systems  Constitutional: Negative for chills and fever.  HENT: Positive for congestion and postnasal drip. Negative for sore throat.   Respiratory: Positive for cough. Negative for shortness of breath and wheezing.   Cardiovascular: Negative for chest pain.       Objective:   Physical Exam  Constitutional: He appears well-developed and well-nourished.  HENT:  Right Ear: External ear normal.  Left Ear: External ear normal.  Nose: Nose normal.  Mouth/Throat: Oropharynx is clear and moist. No oropharyngeal exudate.  Neck: Neck supple.  Cardiovascular: Normal rate and regular rhythm.   Pulmonary/Chest: Effort normal and breath sounds normal. No respiratory distress. He has no wheezes. He has no rales.  Lymphadenopathy:    He has no cervical adenopathy.       Assessment:     Cough. Suspect related to recent viral URI. Nonfocal exam. He does not have any red flags such as fever, dyspnea, appetite or weight changes. May have some allergic postnasal drip component    Plan:     -Tessalon Perles 2 mg every 8 hours as needed for cough -Astelin nasal 1-2 sprays per nostril twice daily as needed for postnasal drip symptoms -Touch base in 2-3 weeks if cough not resolving  Eulas Post MD [facility] at [facility]

## 2016-07-02 NOTE — Patient Instructions (Signed)
Let me know if cough not better in about 2 weeks.

## 2016-07-02 NOTE — Progress Notes (Signed)
Pre visit review using our clinic review tool, if applicable. No additional management support is needed unless otherwise documented below in the visit note. 

## 2016-07-30 ENCOUNTER — Other Ambulatory Visit: Payer: Self-pay | Admitting: Family Medicine

## 2016-08-01 DIAGNOSIS — N5201 Erectile dysfunction due to arterial insufficiency: Secondary | ICD-10-CM | POA: Diagnosis not present

## 2016-08-01 DIAGNOSIS — N400 Enlarged prostate without lower urinary tract symptoms: Secondary | ICD-10-CM | POA: Diagnosis not present

## 2016-08-01 DIAGNOSIS — Z125 Encounter for screening for malignant neoplasm of prostate: Secondary | ICD-10-CM | POA: Diagnosis not present

## 2016-09-25 ENCOUNTER — Encounter: Payer: Self-pay | Admitting: Interventional Cardiology

## 2016-10-01 ENCOUNTER — Ambulatory Visit: Payer: PPO | Admitting: Interventional Cardiology

## 2016-11-18 ENCOUNTER — Encounter: Payer: Self-pay | Admitting: Family Medicine

## 2016-11-18 ENCOUNTER — Ambulatory Visit (INDEPENDENT_AMBULATORY_CARE_PROVIDER_SITE_OTHER): Payer: PPO | Admitting: Family Medicine

## 2016-11-18 VITALS — BP 120/78 | HR 63 | Temp 97.6°F | Ht 72.0 in | Wt 192.3 lb

## 2016-11-18 DIAGNOSIS — Z0000 Encounter for general adult medical examination without abnormal findings: Secondary | ICD-10-CM

## 2016-11-18 NOTE — Progress Notes (Signed)
Subjective:     Patient ID: Jermaine Brady, male   DOB: 05/25/1951, 66 y.o.   MRN: [id number]  HPI Patient seen for physical exam. His chronic problems include history of hypertension, history of atrial septal defect repair age 33, hepatitis C which was likely acquired during that surgery, and BPH. He sees urologist regularly and had recent PSA and digital exam per urology about 3 months ago. Patient is never smoked. He has history of low HDL area deep he had chest pain last fall and nuclear stress test unremarkable. No further pain since then. Said some recent left lateral hip pain. No injury. He notices soreness over the bursa region. Symptoms are relatively mild.  Blood pressure controlled with lisinopril HCTZ. Immunizations up-to-date. Colonoscopy up-to-date. He has history of hepatitis C which has been treated previously  Past Medical History:  Diagnosis Date  . ALLERGIC RHINITIS 10/23/2008  . Arthritis    "neck" (05/19/2016)  . DIVERTICULOSIS, COLON 10/23/2008  . Heart murmur   . Hepatitis C    "don't know when I contracted this; went thru treatment in ~ 2007-2008"  . History of blood transfusion 2008?   "during tx to cure my Hep C"  . History of kidney stones   . HYPERTENSION 10/23/2008   Past Surgical History:  Procedure Laterality Date  . ABDOMINAL HERNIA REPAIR  2005  . ASD REPAIR  1971  . ASD REPAIR  age 54  . CARDIAC CATHETERIZATION  1960s - 1970s X 2   "at [facility]"  . COLON SURGERY  2007   ,resection, diverticular rupture  . HERNIA REPAIR  2005  . SHOULDER ARTHROSCOPY W/ ROTATOR CUFF REPAIR Right 2014    reports that he has never smoked. He has never used smokeless tobacco. He reports that he drinks about 1.2 oz of alcohol per week . He reports that he does not use drugs. family history includes Alcohol abuse in his father and other; Heart disease in his other. No Known Allergies   Review of Systems  Constitutional: Negative for activity change, appetite change,  fatigue and fever.  HENT: Negative for congestion, ear pain and trouble swallowing.   Eyes: Negative for pain and visual disturbance.  Respiratory: Negative for cough, shortness of breath and wheezing.   Cardiovascular: Negative for chest pain and palpitations.  Gastrointestinal: Negative for abdominal distention, abdominal pain, blood in stool, constipation, diarrhea, nausea, rectal pain and vomiting.  Genitourinary: Negative for dysuria, hematuria and testicular pain.  Musculoskeletal: Negative for arthralgias and joint swelling.  Skin: Negative for rash.  Neurological: Negative for dizziness, syncope and headaches.  Hematological: Negative for adenopathy.  Psychiatric/Behavioral: Negative for confusion and dysphoric mood.       Objective:   Physical Exam  Constitutional: He is oriented to person, place, and time. He appears well-developed and well-nourished. No distress.  HENT:  Head: Normocephalic and atraumatic.  Right Ear: External ear normal.  Left Ear: External ear normal.  Mouth/Throat: Oropharynx is clear and moist.  Eyes: Conjunctivae and EOM are normal. Pupils are equal, round, and reactive to light.  Neck: Normal range of motion. Neck supple. No thyromegaly present.  Cardiovascular: Normal rate, regular rhythm and normal heart sounds.   No murmur heard. Pulmonary/Chest: No respiratory distress. He has no wheezes. He has no rales.  Abdominal: Soft. Bowel sounds are normal. He exhibits no distension and no mass. There is no tenderness. There is no rebound and no guarding.  Musculoskeletal: He exhibits no edema.  Lymphadenopathy:  He has no cervical adenopathy.  Neurological: He is alert and oriented to person, place, and time. He displays normal reflexes. No cranial nerve deficit.  Skin: No rash noted.  Psychiatric: He has a normal mood and affect.       Assessment:     Physical exam. We reviewed labs that he had done a few months ago through emergency department.  No indication for further lab work at this time. Immunizations up-to-date.    Plan:     -Recommend icing and stretches for hip pain -Consider follow-up for steroid injection if hip pain persists and not improved with icing -Continue current blood pressure medication which seems to be working well -Reminder for yearly flu vaccine  Eulas Post MD [facility]

## 2016-11-18 NOTE — Patient Instructions (Signed)
Continue with yearly flu vaccine. Hip Bursitis Hip bursitis is inflammation of a fluid-filled sac (bursa) in the hip joint. The bursa protects the bones in the hip joint from rubbing against each other. Hip bursitis can cause mild to moderate pain, and symptoms often come and go over time. What are the causes? This condition may be caused by:  Injury to the hip.  Overuse of the muscles that surround the hip joint.  Arthritis or gout.  Diabetes.  Thyroid disease.  Cold weather.  Infection. In some cases, the cause may not be known. What are the signs or symptoms? Symptoms of this condition may include:  Mild or moderate pain in the hip area. Pain may get worse with movement.  Tenderness and swelling of the hip, especially on the outer side of the hip. Symptoms may come and go. If the bursa becomes infected, you may have the following symptoms:  Fever.  Red skin and a feeling of warmth in the hip area. How is this diagnosed? This condition may be diagnosed based on:  A physical exam.  Your medical history.  X-rays.  Removal of fluid from your inflamed bursa for testing (biopsy). You may be sent to a health care provider who specializes in bone diseases (orthopedist) or a provider who specializes in joint inflammation (rheumatologist). How is this treated? This condition is treated by resting, raising (elevating), and applying pressure(compression) to the injured area. In some cases, this may be enough to make your symptoms go away. Treatment may also include:  Crutches.  Antibiotic medicine.  Draining fluid out of the bursa to help relieve swelling.  Injecting medicine that helps to reduce inflammation (cortisone). Follow these instructions at home: Medicines   Take over-the-counter and prescription medicines only as told by your health care provider.  Do not drive or operate heavy machinery while taking prescription pain medicine, or as told by your health  care provider.  If you were prescribed an antibiotic, take it as told by your health care provider. Do not stop taking the antibiotic even if you start to feel better. Activity   Return to your normal activities as told by your health care provider. Ask your health care provider what activities are safe for you.  Rest and protect your hip as much as possible until your pain and swelling get better. General instructions   Wear compression wraps only as told by your health care provider.  Elevate your hip above the level of your heart as much as you can without pain. To do this, try putting a pillow under your hips while you lie down.  Do not use your hip to support your body weight until your health care provider says that you can. Use crutches as told by your health care provider.  Gently massage and stretch your injured area as often as is comfortable.  Keep all follow-up visits as told by your health care provider. This is important. How is this prevented?  Exercise regularly, as told by your health care provider.  Warm up and stretch before being active.  Cool down and stretch after being active.  If an activity irritates your hip or causes pain, avoid the activity as much as possible.  Avoid sitting down for long periods at a time. Contact a health care provider if:  You have a fever.  You develop new symptoms.  You have difficulty walking or doing everyday activities.  You have pain that gets worse or does not get better with  medicine.  You develop red skin or a feeling of warmth in your hip area. Get help right away if:  You cannot move your hip.  You have severe pain. This information is not intended to replace advice given to you by your health care provider. Make sure you discuss any questions you have with your health care provider. Document Released: 01/03/2002 Document Revised: 12/20/2015 Document Reviewed: 02/13/2015 Elsevier Interactive Patient Education   2017 [employer].

## 2016-11-18 NOTE — Progress Notes (Signed)
Pre visit review using our clinic review tool, if applicable. No additional management support is needed unless otherwise documented below in the visit note. 

## 2017-01-13 DIAGNOSIS — M25512 Pain in left shoulder: Secondary | ICD-10-CM | POA: Diagnosis not present

## 2017-02-03 ENCOUNTER — Other Ambulatory Visit: Payer: Self-pay | Admitting: Family Medicine

## 2017-02-09 ENCOUNTER — Other Ambulatory Visit: Payer: Self-pay | Admitting: Orthopedic Surgery

## 2017-02-09 ENCOUNTER — Other Ambulatory Visit: Payer: Self-pay | Admitting: Physician Assistant

## 2017-02-09 DIAGNOSIS — M25512 Pain in left shoulder: Secondary | ICD-10-CM

## 2017-02-24 ENCOUNTER — Ambulatory Visit
Admission: RE | Admit: 2017-02-24 | Discharge: 2017-02-24 | Disposition: A | Discharge status: Home / Self Care | Payer: PPO | Source: Ambulatory Visit | Attending: Orthopedic Surgery | Admitting: Orthopedic Surgery

## 2017-02-24 DIAGNOSIS — M25512 Pain in left shoulder: Secondary | ICD-10-CM | POA: Diagnosis not present

## 2017-03-17 DIAGNOSIS — M25512 Pain in left shoulder: Secondary | ICD-10-CM | POA: Diagnosis not present

## 2017-04-13 ENCOUNTER — Telehealth: Payer: Self-pay | Admitting: Family Medicine

## 2017-04-13 NOTE — Telephone Encounter (Signed)
Pt states [facility] called him and advised before he can get scheduled for his surgery, Dr Elease Hashimoto wants him to see a cardiologist.  Pt would like to know why he needs to see a cardiologist.  And if so, who?

## 2017-04-13 NOTE — Telephone Encounter (Signed)
Pt had nuclear stress test 10/17 with inferior defect and I want to be sure cardiology is OK with surgery based on that study.

## 2017-04-15 NOTE — Telephone Encounter (Signed)
Left message on machine for patient to return our call 

## 2017-04-16 NOTE — Telephone Encounter (Signed)
Pt is aware to call cardiology for surgical clearance.

## 2017-04-29 ENCOUNTER — Encounter (INDEPENDENT_AMBULATORY_CARE_PROVIDER_SITE_OTHER): Payer: Self-pay

## 2017-04-29 ENCOUNTER — Ambulatory Visit (INDEPENDENT_AMBULATORY_CARE_PROVIDER_SITE_OTHER): Payer: PPO | Admitting: Nurse Practitioner

## 2017-04-29 ENCOUNTER — Encounter: Payer: Self-pay | Admitting: Nurse Practitioner

## 2017-04-29 VITALS — BP 120/78 | HR 72 | Ht 73.0 in | Wt 189.4 lb

## 2017-04-29 DIAGNOSIS — Z8774 Personal history of (corrected) congenital malformations of heart and circulatory system: Secondary | ICD-10-CM

## 2017-04-29 DIAGNOSIS — Z01818 Encounter for other preprocedural examination: Secondary | ICD-10-CM

## 2017-04-29 DIAGNOSIS — I10 Essential (primary) hypertension: Secondary | ICD-10-CM | POA: Diagnosis not present

## 2017-04-29 NOTE — Patient Instructions (Addendum)
We will be checking the following labs today - NONE  I would like to do a fasting lipid and LFT on the day of your stress test/echo   Medication Instructions:    Continue with your current medicines.   Baby aspirin is recommended 81 mg a day    Testing/Procedures To Be Arranged:  GXT  Echocardiogram  Follow-Up:   See Dr. Tamala Julian in one year     Other Special Instructions:   N/A    If you need a refill on your cardiac medications before your next appointment, please call your pharmacy.   Call the [place] office at [phone] if you have any questions, problems or concerns.

## 2017-04-29 NOTE — Progress Notes (Signed)
CARDIOLOGY OFFICE NOTE  Date:  04/29/2017    Jermaine Brady Date of Birth: 1951/06/26 Medical Record #[id number]  PCP:  Eulas Post, MD  Cardiologist:  Tamala Julian    Chief Complaint  Patient presents with  . Pre-op Exam    Surgical clearance - seen for Dr. Tamala Julian    History of Present Illness: Jermaine Brady is a 66 y.o. male who presents today for a pre op clearance visit. Seen for Dr. Tamala Julian.   He has a history of HTN, ASD s/p repair at age 12 (with subsequent lapse in cardiology follow up), BPH and hepatitis C.   Admitted 10/23-10/24/17 for chest pain and palpitations. EKG showed normal sinus rhythm at rate of 88 bpm. Chest x-ray shows cardiomegaly without acute cardiopulmonary disease. Troponin negative x 3. He was seen in consult by Dr. Tamala Julian and plan was for discharge with outpatient stress test and 48 hours holter monitor.  He did do the stress test on 05/27/16 which was overall low risk with a medium defect of mild severity present in the basal inferior location. Reviewed by Dr. Marlou Porch who felt we should start medical therapy for CAD and watch symptoms. Patient did not wish to start beta blocker or statin therapy.   Last seen here in November of 2017 by Bonney Leitz, PA for follow up - no significant chest pain noted - his palpitations had resolved - he did not feel like he needed a heart monitor.   Comes in today. Here alone. Wanting shoulder surgery with Dr. Veverly Fells but had to come here due to "abnormal stress test" from last year. He has injured his left shoulder. He says he is a little confused about his heart situation. He says he was not told about the abnormality on his stress test from a year ago. He feels fine. No chest pain. No palpitations. He is active with his job in [employer] and walks twice a day without issue. Not short of breath. Does not smoke. No syncope. He has not had an echo for follow up of his ASD "in a very long time". He feels like he is  doing well from our standpoint.   Past Medical History:  Diagnosis Date  . ALLERGIC RHINITIS 10/23/2008  . Arthritis    "neck" (05/19/2016)  . DIVERTICULOSIS, COLON 10/23/2008  . Heart murmur   . Hepatitis C    "don't know when I contracted this; went thru treatment in ~ 2007-2008"  . History of blood transfusion 2008?   "during tx to cure my Hep C"  . History of kidney stones   . HYPERTENSION 10/23/2008    Past Surgical History:  Procedure Laterality Date  . ABDOMINAL HERNIA REPAIR  2005  . ASD REPAIR  1971  . ASD REPAIR  age 65  . CARDIAC CATHETERIZATION  1960s - 1970s X 2   "at [facility]"  . COLON SURGERY  2007   ,resection, diverticular rupture  . HERNIA REPAIR  2005  . SHOULDER ARTHROSCOPY W/ ROTATOR CUFF REPAIR Right 2014     Medications: Current Meds  Medication Sig  . azelastine (ASTELIN) 0.1 % nasal spray Place 2 sprays into both nostrils 2 (two) times daily. Use in each nostril as directed  . Cholecalciferol (VITAMIN D3) 1000 UNITS CAPS Take 1,000 Units by mouth daily.   . fexofenadine (ALLEGRA) 180 MG tablet Take 180 mg by mouth daily as needed for allergies.   . fluticasone (FLONASE) 50 MCG/ACT nasal spray Place 2  sprays into the nose daily as needed for allergies.   Marland Kitchen HYDROcodone-acetaminophen (NORCO/VICODIN) 5-325 MG tablet TAKE 1/2 TO 1 TABLET BY MOUTH EVERY 4-6 HOURS AS NEEDED FOR PAIN  . lisinopril-hydrochlorothiazide (PRINZIDE,ZESTORETIC) 20-12.5 MG tablet Take 1 tablet by mouth every day (need appointment)  . naproxen sodium (ANAPROX) 220 MG tablet Take 440 mg by mouth 2 (two) times daily as needed (for pain).  . Omega-3 Fatty Acids (FISH OIL PO) Take 1 capsule by mouth daily.  . Tamsulosin HCl (FLOMAX) 0.4 MG CAPS Take 0.4 mg by mouth daily.      Allergies: No Known Allergies  Social History: The patient  reports that he has never smoked. He has never used smokeless tobacco. He reports that he drinks about 1.2 oz of alcohol per week . He reports that he  does not use drugs.   Family History: The patient's family history includes Alcohol abuse in his father and other; Heart disease in his other.   Review of Systems: Please see the history of present illness.   Otherwise, the review of systems is positive for none.   All other systems are reviewed and negative.   Physical Exam: VS:  BP 120/78 (BP Location: Left Arm, Patient Position: Sitting, Cuff Size: Normal)   Pulse 72   Ht 6\' 1"  (1.854 m)   Wt 189 lb 6.4 oz (85.9 kg)   BMI 24.99 kg/m  .  BMI Body mass index is 24.99 kg/m.  Wt Readings from Last 3 Encounters:  04/29/17 189 lb 6.4 oz (85.9 kg)  11/18/16 192 lb 4.8 oz (87.2 kg)  07/02/16 187 lb 9.6 oz (85.1 kg)    General: Pleasant. Well developed, well nourished and in no acute distress.   HEENT: Normal.  Neck: Supple, no JVD, carotid bruits, or masses noted.  Cardiac: Regular rate and rhythm. No real murmur that I can appreciate. No edema.  Respiratory:  Lungs are clear to auscultation bilaterally with normal work of breathing.  GI: Soft and nontender.  MS: No deformity or atrophy. Gait and ROM intact.  Skin: Warm and dry. Color is normal.  Neuro:  Strength and sensation are intact and no gross focal deficits noted.  Psych: Alert, appropriate and with normal affect.   LABORATORY DATA:  EKG:  EKG is ordered today. This demonstrates NSR - reviewed with Dr. Tamala Julian here in the office today.  Lab Results  Component Value Date   WBC 6.5 05/19/2016   HGB 15.8 05/19/2016   HCT 45.3 05/19/2016   PLT 137 (L) 05/19/2016   GLUCOSE 129 (H) 05/19/2016   CHOL 109 12/15/2014   TRIG 64.0 12/15/2014   HDL 28.80 (L) 12/15/2014   LDLCALC 67 12/15/2014   ALT 16 12/15/2014   AST 13 12/15/2014   NA 138 05/19/2016   K 3.6 05/19/2016   CL 103 05/19/2016   CREATININE 1.15 05/19/2016   BUN 25 (H) 05/19/2016   CO2 27 05/19/2016   TSH 2.638 05/20/2016   PSA 1.20 12/15/2014   INR 1.2 04/30/2007   HGBA1C 5.4 05/19/2016     BNP (last  3 results) No results for input(s): BNP in the last 8760 hours.  ProBNP (last 3 results) No results for input(s): PROBNP in the last 8760 hours.   Other Studies Reviewed Today:  05/27/16 Myoview Study Highlights    Nuclear stress EF: 67%. No wall motion abnormality  There was no ST segment deviation noted during stress.  Defect 1: There is a medium defect of mild  severity present in the basal inferior location.  This is a low risk study (inferior wall defect).  Findings consistent with ischemia.     Assessment/Plan:  1. Pre op clearance - he has no active symptoms. He has had abnormal stress testing - although felt to be low risk - needs to be treated as if he has CAD - discussed with Dr. Tamala Julian - would like to have GXT to assess for EKG changes/ischemia and exercise tolerance. If he does well on the GXT he should be an acceptable candidate for shoulder surgery.   2. Prior chest pain - has not recurred.   3. Prior abnormal Myoview - CV risk factor modification encouraged - would advise low dose baby aspirin, continued exercise, he does not smoke, and would reevaluate his lipids.   4. Palpitations:  Has not recurred  5. HTN: BP well controlled on current reigmen  6. ASD s/p repair: no murmur on exam. He needs his echo updated.   Current medicines are reviewed with the patient today.  The patient does not have concerns regarding medicines other than what has been noted above.  The following changes have been made:  See above.  Labs/ tests ordered today include:    Orders Placed This Encounter  Procedures  . Lipid panel  . Hepatic function panel  . EKG 12-Lead  . ECHOCARDIOGRAM COMPLETE  . EXERCISE TOLERANCE TEST (ETT)     Disposition:   FU with Dr. Tamala Julian tentatively in a year.    Patient is agreeable to this plan and will call if any problems develop in the interim.   SignedTruitt Merle, NP  04/29/2017 9:40 AM  [facility] [address] [place], [place]  [address] Phone: [phone] Fax: [phone]

## 2017-05-13 ENCOUNTER — Other Ambulatory Visit: Payer: Self-pay

## 2017-05-13 ENCOUNTER — Ambulatory Visit (HOSPITAL_COMMUNITY): Payer: PPO | Attending: Cardiovascular Disease

## 2017-05-13 ENCOUNTER — Ambulatory Visit (INDEPENDENT_AMBULATORY_CARE_PROVIDER_SITE_OTHER): Payer: PPO

## 2017-05-13 ENCOUNTER — Other Ambulatory Visit: Payer: PPO | Admitting: *Deleted

## 2017-05-13 DIAGNOSIS — Z8774 Personal history of (corrected) congenital malformations of heart and circulatory system: Secondary | ICD-10-CM

## 2017-05-13 DIAGNOSIS — I10 Essential (primary) hypertension: Secondary | ICD-10-CM

## 2017-05-13 DIAGNOSIS — Z01818 Encounter for other preprocedural examination: Secondary | ICD-10-CM

## 2017-05-13 DIAGNOSIS — I5030 Unspecified diastolic (congestive) heart failure: Secondary | ICD-10-CM | POA: Diagnosis not present

## 2017-05-13 LAB — HEPATIC FUNCTION PANEL
ALT: 14 IU/L (ref 0–44)
AST: 17 IU/L (ref 0–40)
Albumin: 4.7 g/dL (ref 3.6–4.8)
Alkaline Phosphatase: 74 IU/L (ref 39–117)
Bilirubin Total: 0.6 mg/dL (ref 0.0–1.2)
Bilirubin, Direct: 0.17 mg/dL (ref 0.00–0.40)
Total Protein: 6.8 g/dL (ref 6.0–8.5)

## 2017-05-13 LAB — EXERCISE TOLERANCE TEST
Estimated workload: 10.1 METS
Exercise duration (min): 8 min
Exercise duration (sec): 0 s
MPHR: 153 {beats}/min
Peak HR: 144 {beats}/min
Percent HR: 94 %
RPE: 16
Rest HR: 73 {beats}/min

## 2017-05-13 LAB — LIPID PANEL
Chol/HDL Ratio: 4.4 ratio (ref 0.0–5.0)
Cholesterol, Total: 128 mg/dL (ref 100–199)
HDL: 29 mg/dL — ABNORMAL LOW (ref >39)
LDL Calculated: 86 mg/dL (ref 0–99)
Triglycerides: 64 mg/dL (ref 0–149)
VLDL Cholesterol Cal: 13 mg/dL (ref 5–40)

## 2017-05-21 ENCOUNTER — Telehealth: Payer: Self-pay | Admitting: *Deleted

## 2017-05-21 NOTE — Telephone Encounter (Signed)
   [facility] Pre-operative Risk Assessment    Request for surgical clearance:  1. What type of surgery is being performed? Left Shoulder: left shoulder scope, A-SAD, mini-open biceps tenodesis, open DCR, BICEPS TENODESIS-open, Open resection distal Clavical  2. When is this surgery scheduled? In the near future   3. Are there any medications that need to be held prior to surgery and how long?Aspirin '"Please give permission to stop aspirin" no number of days requested   4. Practice name and name of physician performing surgery? [facility], Dr. Veverly Fells   5. What is your office phone and fax number? Phone [phone]; Fax: [phone], [place] :Santiago Bur   6. Anesthesia type (None, local, MAC, general) ? None specified.  ---Pt was cleared after OV and testing by Truitt Merle, (see OV 10/3 and GXT 10/17).  Needs recommendations on aspirin.   Rodman Key 05/21/2017, 2:16 PM  _________________________________________________________________   (provider comments below)

## 2017-05-22 NOTE — Telephone Encounter (Signed)
Faxed to Santiago Bur, [employer], via [employer].

## 2017-05-22 NOTE — Telephone Encounter (Signed)
   Chart reviewed as part of pre-operative protocol coverage. Given past medical history and time since last visit, based on ACC/AHA guidelines, Jermaine Brady would be at acceptable risk for the planned procedure without further cardiovascular testing.   He can told aspirin for 7 days prior to surgery and restart as soon as possible afterward if he is taking aspirin. Although I did not see aspirin listed under his medication list.   Almyra Deforest, PA 05/22/2017, 2:26 PM

## 2017-07-29 DIAGNOSIS — H40013 Open angle with borderline findings, low risk, bilateral: Secondary | ICD-10-CM | POA: Diagnosis not present

## 2017-07-29 DIAGNOSIS — H2513 Age-related nuclear cataract, bilateral: Secondary | ICD-10-CM | POA: Diagnosis not present

## 2017-07-31 ENCOUNTER — Telehealth: Payer: Self-pay | Admitting: Family Medicine

## 2017-07-31 ENCOUNTER — Other Ambulatory Visit: Payer: Self-pay

## 2017-07-31 MED ORDER — LISINOPRIL-HYDROCHLOROTHIAZIDE 20-12.5 MG PO TABS: ORAL_TABLET | ORAL | 1 refills | Status: DC

## 2017-07-31 NOTE — Telephone Encounter (Signed)
Copied from [place] [phone]. Topic: Quick Communication - See Telephone Encounter >> Jul 31, 2017  9:02 AM Bea Graff, NT wrote: CRM for notification. See Telephone encounter for: Pts wife is calling and states that they normally use [facility] mail order pharmacy for their medications but they have had a hard time getting a refill for this pt for  lisinopril-hydrochlorothiazide  and now he is almost out. Wife is asking if an rx can be sent to [facility] on [address] in [place] for this medication? She stated normally they have a 90 day supply.  07/31/17.

## 2017-08-01 ENCOUNTER — Other Ambulatory Visit: Payer: Self-pay | Admitting: Family Medicine

## 2017-08-04 MED ORDER — LISINOPRIL-HYDROCHLOROTHIAZIDE 20-12.5 MG PO TABS: ORAL_TABLET | ORAL | 1 refills | Status: DC

## 2017-08-14 ENCOUNTER — Emergency Department (HOSPITAL_COMMUNITY): Payer: PPO

## 2017-08-14 ENCOUNTER — Other Ambulatory Visit: Payer: Self-pay

## 2017-08-14 ENCOUNTER — Emergency Department (HOSPITAL_COMMUNITY)
Admission: EM | Admit: 2017-08-14 | Discharge: 2017-08-14 | Disposition: A | Discharge status: Home / Self Care | Payer: PPO | Attending: Physician Assistant | Admitting: Physician Assistant

## 2017-08-14 ENCOUNTER — Encounter (HOSPITAL_COMMUNITY): Payer: Self-pay | Admitting: *Deleted

## 2017-08-14 ENCOUNTER — Telehealth: Payer: Self-pay | Admitting: Interventional Cardiology

## 2017-08-14 DIAGNOSIS — R079 Chest pain, unspecified: Secondary | ICD-10-CM | POA: Insufficient documentation

## 2017-08-14 DIAGNOSIS — Z7982 Long term (current) use of aspirin: Secondary | ICD-10-CM | POA: Insufficient documentation

## 2017-08-14 DIAGNOSIS — I10 Essential (primary) hypertension: Secondary | ICD-10-CM | POA: Diagnosis not present

## 2017-08-14 DIAGNOSIS — Z79899 Other long term (current) drug therapy: Secondary | ICD-10-CM | POA: Insufficient documentation

## 2017-08-14 LAB — BASIC METABOLIC PANEL
ANION GAP: 9 (ref 5–15)
BUN: 22 mg/dL — ABNORMAL HIGH (ref 6–20)
CALCIUM: 9.5 mg/dL (ref 8.9–10.3)
CHLORIDE: 104 mmol/L (ref 101–111)
CO2: 24 mmol/L (ref 22–32)
Creatinine, Ser: 1.06 mg/dL (ref 0.61–1.24)
GFR calc non Af Amer: >60 mL/min (ref >60)
Glucose, Bld: 128 mg/dL — ABNORMAL HIGH (ref 65–99)
POTASSIUM: 4.3 mmol/L (ref 3.5–5.1)
Sodium: 137 mmol/L (ref 135–145)

## 2017-08-14 LAB — CBC
HEMATOCRIT: 47.6 % (ref 39.0–52.0)
HEMOGLOBIN: 16.3 g/dL (ref 13.0–17.0)
MCH: 30.1 pg (ref 26.0–34.0)
MCHC: 34.2 g/dL (ref 30.0–36.0)
MCV: 87.8 fL (ref 78.0–100.0)
Platelets: 148 10*3/uL — ABNORMAL LOW (ref 150–400)
RBC: 5.42 MIL/uL (ref 4.22–5.81)
RDW: 14.8 % (ref 11.5–15.5)
WBC: 5.7 10*3/uL (ref 4.0–10.5)

## 2017-08-14 LAB — I-STAT TROPONIN, ED
TROPONIN I, POC: 0 ng/mL (ref 0.00–0.08)
Troponin i, poc: 0.01 ng/mL (ref 0.00–0.08)

## 2017-08-14 NOTE — ED Triage Notes (Signed)
Pt reports left side chest pain intermittent for past several days. Denies sob, denies cough or swelling to extremities. No acute distress is noted at triage and ekg done.

## 2017-08-14 NOTE — Discharge Instructions (Signed)
Your workup in the emergency department did not reveal a concerning cause of your chest pain.  We advised that you follow-up with your primary care doctor as well as your cardiologist regarding your visit today.  Should symptoms worsen or become concerning, return to the emergency department for additional evaluation.  Continue taking your daily medications as prescribed.

## 2017-08-14 NOTE — ED Notes (Signed)
Patient and family member upset about wait time. Patient would like to speak to EDP before changing into a gown or repeating blood work.

## 2017-08-14 NOTE — ED Notes (Signed)
ED Provider at bedside. 

## 2017-08-14 NOTE — ED Notes (Signed)
Pt refusing d/c VS 

## 2017-08-14 NOTE — ED Notes (Signed)
Pt verbalizes understanding of d/c instructions. Pt ambulatory at d/c with all belongings.   

## 2017-08-14 NOTE — Telephone Encounter (Signed)
Spoke with patient's wife concerning her husband's CP, left arm pain, tingling/numbness in the hand, which he experienced while at work. No hx of CAD, although was evaluated for CP in 2017.   Unfortunately, he was determined to drive home from work instead of getting immediate care while at work.  I advised her on a visit to the ED via EMS as soon as he arrives home and not to wait for symptoms to worsen. She verbalized understanding.

## 2017-08-14 NOTE — Telephone Encounter (Signed)
New Message   Per wife - husband was at work and started getting these pains ,  Now he is headed back to her so they can go to er   Pt c/o of Chest Pain: STAT if CP now or developed within 24 hours  1. Are you having CP right now? Yes -it started mid shoulder blade to  shoulder and going down his arm, his hands are tingling   2. Are you experiencing any other symptoms (ex. SOB, nausea, vomiting, sweating)? No symptoms  3. How long have you been experiencing CP? 9am   4. Is your CP continuous or coming and going? continuous  5. Have you taken Nitroglycerin?  No  ?

## 2017-08-14 NOTE — ED Provider Notes (Signed)
EMERGENCY DEPARTMENT Provider Note   CSN: [id number] Arrival date & time: 08/14/17  1352     History   Chief Complaint Chief Complaint  Patient presents with  . Chest Pain    HPI Jermaine Brady is a 67 y.o. male.  67 year old male with a history of HTN, ASD s/p repair at age 70 (with subsequent lapse in cardiology follow up), BPH and hepatitis C presents to the emergency department for evaluation of chest pain.  He reports an aching chest pain which has been present in his left chest.  He notes pain for almost a week.  It is intermittent and waxing and waning in severity.  He reports progression to include pain in his left shoulder as well as paresthesias in his left hand.  Shoulder pain and paresthesias began today prompting ED evaluation.  Patient has taken Advil for symptoms without relief.  He specifically denies any worsening chest pain with exertion, deep breathing, palpation.  He has not had any fevers, diaphoresis, cough, shortness of breath, lightheadedness, syncope or near syncope, dizziness, leg swelling.  He has been taking his daily medications as prescribed including his 81 mg aspirin.  He has not taken any nitroglycerin for symptoms.  Patient called his cardiologist prior to arrival who advised ED evaluation.      Past Medical History:  Diagnosis Date  . ALLERGIC RHINITIS 10/23/2008  . Arthritis    "neck" (05/19/2016)  . DIVERTICULOSIS, COLON 10/23/2008  . Heart murmur   . Hepatitis C    "don't know when I contracted this; went thru treatment in ~ 2007-2008"  . History of blood transfusion 2008?   "during tx to cure my Hep C"  . History of kidney stones   . HYPERTENSION 10/23/2008    Patient Active Problem List   Diagnosis Date Noted  . Chest pain 05/19/2016  . Hepatitis C 11/06/2010  . CELLULITIS, HAND, LEFT 01/07/2010  . Essential hypertension 10/23/2008  . ALLERGIC RHINITIS 10/23/2008  . DIVERTICULOSIS, COLON 10/23/2008  .  COLONIC POLYPS, HX OF 10/23/2008    Past Surgical History:  Procedure Laterality Date  . ABDOMINAL HERNIA REPAIR  2005  . ASD REPAIR  1971  . ASD REPAIR  age 89  . CARDIAC CATHETERIZATION  1960s - 1970s X 2   "at [facility]"  . COLON SURGERY  2007   ,resection, diverticular rupture  . HERNIA REPAIR  2005  . SHOULDER ARTHROSCOPY W/ ROTATOR CUFF REPAIR Right 2014       Home Medications    Prior to Admission medications   Medication Sig Start Date End Date Taking? Authorizing Provider  aspirin EC 81 MG tablet Take 81 mg by mouth at bedtime.   Yes [provider]  Cholecalciferol (VITAMIN D3) 1000 UNITS CAPS Take 1,000 Units by mouth at bedtime.    Yes [provider]  fexofenadine (ALLEGRA) 180 MG tablet Take 180 mg by mouth daily as needed for allergies.    Yes [provider]  lisinopril-hydrochlorothiazide (PRINZIDE,ZESTORETIC) 20-12.5 MG tablet Take 1 tablet by mouth every day (need appointment) Patient taking differently: Take 1 tablet by mouth at bedtime. (need appointment) 08/04/17  Yes Burchette, Alinda Sierras, MD  naproxen sodium (ALEVE) 220 MG tablet Take 440 mg by mouth 2 (two) times daily as needed (pain/headache).   Yes [provider]  Omega-3 Fatty Acids (FISH OIL PO) Take 1 capsule by mouth at bedtime.    Yes [provider]  sildenafil (REVATIO) 20  MG tablet Take 40 mg by mouth daily as needed (erectile dysfunction).   Yes [provider]  Tamsulosin HCl (FLOMAX) 0.4 MG CAPS Take 0.4 mg by mouth at bedtime.    Yes [provider]  azelastine (ASTELIN) 0.1 % nasal spray Place 2 sprays into both nostrils 2 (two) times daily. Use in each nostril as directed Patient not taking: Reported on 08/14/2017 07/02/16   Eulas Post, MD    Family History Family History  Problem Relation Age of Onset  . Alcohol abuse Other   . Heart disease Other   . Alcohol abuse Father   . Colon cancer Neg Hx     Social  History Social History   Tobacco Use  . Smoking status: Never Smoker  . Smokeless tobacco: Never Used  Substance Use Topics  . Alcohol use: Yes    Alcohol/week: 1.2 oz    Types: 2 Cans of beer per week  . Drug use: No     Allergies   Patient has no known allergies.   Review of Systems Review of Systems Ten systems reviewed and are negative for acute change, except as noted in the HPI.    Physical Exam Updated Vital Signs BP 133/62 (BP Location: Right Arm)   Pulse 75   Resp 18   SpO2 99%   Physical Exam  Constitutional: He is oriented to person, place, and time. He appears well-developed and well-nourished. No distress.  Nontoxic appearing and in NAD  HENT:  Head: Normocephalic and atraumatic.  Eyes: Conjunctivae and EOM are normal. No scleral icterus.  Neck: Normal range of motion.  Cardiovascular: Normal rate, regular rhythm and intact distal pulses.  Bilateral radial pulses 2+  Pulmonary/Chest: Effort normal. No stridor. No respiratory distress. He has no wheezes.  Lungs CTAB  Musculoskeletal: Normal range of motion.  No BLE pitting edema.  Neurological: He is alert and oriented to person, place, and time. He exhibits normal muscle tone. Coordination normal.  Skin: Skin is warm and dry. No rash noted. He is not diaphoretic. No erythema. No pallor.  Psychiatric: He has a normal mood and affect. His behavior is normal.  Nursing note and vitals reviewed.    ED Treatments / Results  Labs (all labs ordered are listed, but only abnormal results are displayed) Labs Reviewed  BASIC METABOLIC PANEL - Abnormal; Notable for the following components:      Result Value   Glucose, Bld 128 (*)    BUN 22 (*)    All other components within normal limits  CBC - Abnormal; Notable for the following components:   Platelets 148 (*)    All other components within normal limits  I-STAT TROPONIN, ED  I-STAT TROPONIN, ED    EKG  EKG Interpretation  Date/Time:  Friday  August 14 2017 14:05:46 EST Ventricular Rate:  75 PR Interval:  196 QRS Duration: 90 QT Interval:  364 QTC Calculation: 406 R Axis:   74 Text Interpretation:  Normal sinus rhythm Normal ECG Normal sinus rhythm Confirmed by Thomasene Lot, [place] [phone]) on 08/14/2017 8:51:02 PM       Radiology Dg Chest 2 View  Result Date: 08/14/2017 CLINICAL DATA:  Pt has been having CP on and off x 1 week. Pain is more on the left side and into the left arm. Pt denies SOB. Pt also states he has left shoulder injury that could be causing the pain but hes not sure. Pt has a hx of high BP an.*comment was truncated*  EXAM: CHEST  2 VIEW FINDINGS: Sternotomy wires overlie normal cardiac silhouette. Lungs are hyperinflated. No effusion, infiltrate pneumothorax. No acute osseous abnormality. IMPRESSION: No acute cardiopulmonary process. Electronically Signed   By: Suzy Bouchard M.D.   On: 08/14/2017 15:04    Procedures Procedures (including critical care time)   ETT 05/13/17   Conclusions   Result status: Final result    Blood pressure demonstrated a normal response to exercise.  There was no ST segment deviation noted during stress.   ETT with fair exercise tolerance (8:01); no chest pain; normal BP response; no ST changes; negative adequate ETT; Duke treadmill score 8.    Transthoracic Echocardiography 05/13/17 Patient:    Emrick, Hensch MR #:       [id number] Study Date: 05/13/2017 Gender:     M Age:        62 Height:     185.4 cm Weight:     85.9 kg BSA:        2.11 m^2 Pt. Status: Room:   Nedra Hai  SONOGRAPHER  Alethia Berthold  REFERRING    Burtis Junes  PERFORMING   [facility]  ATTENDING    Skeet Latch, MD  cc:  ------------------------------------------------------------------- LV EF: 60% -   65%  ------------------------------------------------------------------- Indications:      Z01.818  Pre-operative clearance. I10-Essential hypertension. Z87.74 ASD repair.  ------------------------------------------------------------------- History:   PMH:  Hepatitis C.  Murmur.  ------------------------------------------------------------------- Study Conclusions  - Left ventricle: The cavity size was normal. There was moderate   focal basal hypertrophy of the septum with otherwise mild   concentric hypertrophy. Systolic function was normal. The   estimated ejection fraction was in the range of 60% to 65%. Wall   motion was normal; there were no regional wall motion   abnormalities. Doppler parameters are consistent with abnormal   left ventricular relaxation (grade 1 diastolic dysfunction). - Aortic valve: Transvalvular velocity was within the normal range.   There was no stenosis. There was no regurgitation. - Mitral valve: Transvalvular velocity was within the normal range.   There was no evidence for stenosis. There was no regurgitation. - Right ventricle: The cavity size was normal. Wall thickness was   normal. Systolic function was normal. - Atrial septum: No defect or patent foramen ovale was identified. - Tricuspid valve: There was trivial regurgitation. - Pulmonary arteries: Systolic pressure was within the normal   range. PA peak pressure: 20 mm Hg (S).   Medications Ordered in ED Medications - No data to display   Initial Impression / Assessment and Plan / ED Course  I have reviewed the triage vital signs and the nursing notes.  Pertinent labs & imaging results that were available during my care of the patient were reviewed by me and considered in my medical decision making (see chart for details).     67 year old male presents to the emergency department for evaluation of chest pain.  Symptoms have been intermittent over the past week.  They are not exacerbated with exertion, palpation, deep breathing.  Patient attributes his pain to need for left shoulder  surgery.  History reviewed with reassuring echocardiogram and stress test in October.  He has a nonischemic EKG and negative troponin x2.  No leukocytosis or electrolyte derangements.  Normal H/H.  Chest x-ray without mediastinal widening to suggest dissection.  No pneumothorax, pleural effusion, pneumonia, or other consolidation.  Patient has expressed  on multiple occasions his frustration with the weight times experienced.  I have expressed empathy for his feelings and provided explanation for these wait times, while also providing reassurance towards his work up in the emergency department today.  I believe it is reasonable for him to continue follow-up with his primary care doctor and/or cardiologist.  Return precautions discussed and provided.  Patient discharged in stable condition with no unaddressed concerns.   Final Clinical Impressions(s) / ED Diagnoses   Final diagnoses:  Chest pain, unspecified type    ED Discharge Orders    None       Antonietta Breach, PA-C 08/14/17 2210    Macarthur Critchley, MD 08/14/17 2258

## 2017-08-15 NOTE — Telephone Encounter (Signed)
Noted  

## 2017-08-17 DIAGNOSIS — N5201 Erectile dysfunction due to arterial insufficiency: Secondary | ICD-10-CM | POA: Diagnosis not present

## 2017-08-17 DIAGNOSIS — Z125 Encounter for screening for malignant neoplasm of prostate: Secondary | ICD-10-CM | POA: Diagnosis not present

## 2017-08-17 DIAGNOSIS — N400 Enlarged prostate without lower urinary tract symptoms: Secondary | ICD-10-CM | POA: Diagnosis not present

## 2017-08-21 ENCOUNTER — Encounter: Payer: Self-pay | Admitting: Family Medicine

## 2017-08-21 ENCOUNTER — Ambulatory Visit (INDEPENDENT_AMBULATORY_CARE_PROVIDER_SITE_OTHER): Payer: PPO | Admitting: Family Medicine

## 2017-08-21 VITALS — BP 120/60 | HR 95 | Temp 97.7°F | Wt 190.8 lb

## 2017-08-21 DIAGNOSIS — I10 Essential (primary) hypertension: Secondary | ICD-10-CM | POA: Diagnosis not present

## 2017-08-21 DIAGNOSIS — R202 Paresthesia of skin: Secondary | ICD-10-CM | POA: Diagnosis not present

## 2017-08-21 DIAGNOSIS — Z23 Encounter for immunization: Secondary | ICD-10-CM | POA: Diagnosis not present

## 2017-08-21 DIAGNOSIS — M25512 Pain in left shoulder: Secondary | ICD-10-CM

## 2017-08-21 MED ORDER — LISINOPRIL-HYDROCHLOROTHIAZIDE 20-12.5 MG PO TABS: ORAL_TABLET | ORAL | 3 refills | Status: DC

## 2017-08-21 NOTE — Patient Instructions (Signed)
Consider night-time use of right wrist splint If any progressive right hand numbness or any progressive pain.

## 2017-08-21 NOTE — Progress Notes (Signed)
Subjective:     Patient ID: Jermaine Brady, male   DOB: Mar 19, 1951, 67 y.o.   MRN: [id number]  HPI Patient seen with the following issues:  Recent left shoulder pains with some radiation toward the left chest. He went to ER week ago and had troponins were negative. Chest x-ray and EKG unremarkable. Was felt this was noncardiac. Pain is not reproducible. No chest tightness. No dyspnea. He describes achy pain which can last for several hours but does come and go to some extent. He had stress test last fall which was unremarkable. Nonsmoker. has seen orthopedist and apparently has torn left labrum and is looking at potential surgery for that  Denies any neck pain currently. Other issue is right hand tingling past couple weeks. No clear exacerbating or alleviating factors. No associated pain. He does have known cervical spondylosis but has not had any recent severe neck pain.  He ambulates daily with walking about 2 times per day with his dogs but has never had any pain with walking hills.  Requesting refills of Lisinopril.  BP has been stable.  Past Medical History:  Diagnosis Date  . ALLERGIC RHINITIS 10/23/2008  . Arthritis    "neck" (05/19/2016)  . DIVERTICULOSIS, COLON 10/23/2008  . Heart murmur   . Hepatitis C    "don't know when I contracted this; went thru treatment in ~ 2007-2008"  . History of blood transfusion 2008?   "during tx to cure my Hep C"  . History of kidney stones   . HYPERTENSION 10/23/2008   Past Surgical History:  Procedure Laterality Date  . ABDOMINAL HERNIA REPAIR  2005  . ASD REPAIR  1971  . ASD REPAIR  age 79  . CARDIAC CATHETERIZATION  1960s - 1970s X 2   "at [facility]"  . COLON SURGERY  2007   ,resection, diverticular rupture  . HERNIA REPAIR  2005  . SHOULDER ARTHROSCOPY W/ ROTATOR CUFF REPAIR Right 2014    reports that  has never smoked. he has never used smokeless tobacco. He reports that he drinks about 1.2 oz of alcohol per week. He reports that he does  not use drugs. family history includes Alcohol abuse in his father and other; Heart disease in his other. No Known Allergies   Review of Systems  Constitutional: Negative for fatigue.  Eyes: Negative for visual disturbance.  Respiratory: Negative for cough, chest tightness and shortness of breath.   Cardiovascular: Negative for chest pain, palpitations and leg swelling.  Neurological: Negative for dizziness, syncope, weakness, light-headedness and headaches.       Objective:   Physical Exam  Constitutional: He is oriented to person, place, and time. He appears well-developed and well-nourished.  HENT:  Right Ear: External ear normal.  Left Ear: External ear normal.  Mouth/Throat: Oropharynx is clear and moist.  Eyes: Pupils are equal, round, and reactive to light.  Neck: Neck supple. No thyromegaly present.  Cardiovascular: Normal rate and regular rhythm.  Pulmonary/Chest: Effort normal and breath sounds normal. No respiratory distress. He has no wheezes. He has no rales.  Musculoskeletal: He exhibits no edema.  Neurological: He is alert and oriented to person, place, and time.       Assessment:     #1 left shoulder pain. Reported left labrum tear. He's had symptoms in left chest pains with seem to occur with the shoulder pain but not clear if these are related. Symptoms do not sound very likely cardiac. Previous stress testing unremarkable and he has never  had exertional component  #2 right upper extremity tingling. Nonfocal exam. Question is whether this is from carpal tunnel versus right neck related. Symptoms are relatively mild at this time and he has no weakness or other focal findings neurologically.    Plan:     -observe for now. Consider nighttime use of right wrist splint if symptoms persist. Consider nerve conduction testing if symptoms persist or worsen -flu vaccine and pneumovax given. -refilled Lisinopril HCTZ for one year.   Eulas Post MD [place]  [facility]

## 2017-10-05 DIAGNOSIS — H903 Sensorineural hearing loss, bilateral: Secondary | ICD-10-CM | POA: Diagnosis not present

## 2017-10-23 DIAGNOSIS — H903 Sensorineural hearing loss, bilateral: Secondary | ICD-10-CM | POA: Diagnosis not present

## 2017-12-19 DIAGNOSIS — N400 Enlarged prostate without lower urinary tract symptoms: Secondary | ICD-10-CM | POA: Insufficient documentation

## 2017-12-19 DIAGNOSIS — J302 Other seasonal allergic rhinitis: Secondary | ICD-10-CM | POA: Insufficient documentation

## 2017-12-19 DIAGNOSIS — J069 Acute upper respiratory infection, unspecified: Secondary | ICD-10-CM | POA: Diagnosis not present

## 2017-12-19 DIAGNOSIS — R110 Nausea: Secondary | ICD-10-CM | POA: Diagnosis not present

## 2017-12-22 ENCOUNTER — Ambulatory Visit: Payer: PPO | Admitting: Family Medicine

## 2017-12-22 ENCOUNTER — Ambulatory Visit (INDEPENDENT_AMBULATORY_CARE_PROVIDER_SITE_OTHER): Payer: PPO | Admitting: Family Medicine

## 2017-12-22 ENCOUNTER — Encounter: Payer: Self-pay | Admitting: Family Medicine

## 2017-12-22 VITALS — BP 120/78 | HR 91 | Temp 98.3°F | Wt 184.0 lb

## 2017-12-22 DIAGNOSIS — R05 Cough: Secondary | ICD-10-CM | POA: Diagnosis not present

## 2017-12-22 DIAGNOSIS — R059 Cough, unspecified: Secondary | ICD-10-CM

## 2017-12-22 MED ORDER — HYDROCOD POLST-CPM POLST ER 10-8 MG/5ML PO SUER: 5.0000 mL | Freq: Two times a day (BID) | ORAL | 0 refills | Status: DC | PRN

## 2017-12-22 NOTE — Progress Notes (Signed)
Subjective:     Patient ID: Jermaine Brady, male   DOB: 1950/11/27, 67 y.o.   MRN: [id number]  HPI Patient seen with some progressive cough for the past week. He had initial onset of sore throat and some nasal congestion about 10 days ago. Had progressive symptoms over last week and went to urgent care this past Saturday. He was prescribed multiple medications including Zithromax, Tessalon, and prednisone.  He feels no better this time. He states he's had essentially no sleep past couple nights and he thinks he has been "hyped up" because of the prednisone. He feels the Lavella Lemons is not helping his cough whatsoever. He is not aware of any further fever. No dyspnea at rest. No history of asthma.  No chest x-rays done at recent urgent care visit. Patient had Tussionex on his med list but he states was not filled and we called the pharmacy and confirm this was never received by their pharmacy.  Patient states his wife now has similar symptoms.  Past Medical History:  Diagnosis Date  . ALLERGIC RHINITIS 10/23/2008  . Arthritis    "neck" (05/19/2016)  . DIVERTICULOSIS, COLON 10/23/2008  . Heart murmur   . Hepatitis C    "don't know when I contracted this; went thru treatment in ~ 2007-2008"  . History of blood transfusion 2008?   "during tx to cure my Hep C"  . History of kidney stones   . HYPERTENSION 10/23/2008   Past Surgical History:  Procedure Laterality Date  . ABDOMINAL HERNIA REPAIR  2005  . ASD REPAIR  1971  . ASD REPAIR  age 21  . CARDIAC CATHETERIZATION  1960s - 1970s X 2   "at [facility]"  . COLON SURGERY  2007   ,resection, diverticular rupture  . HERNIA REPAIR  2005  . SHOULDER ARTHROSCOPY W/ ROTATOR CUFF REPAIR Right 2014    reports that he has never smoked. He has never used smokeless tobacco. He reports that he drinks about 1.2 oz of alcohol per week. He reports that he does not use drugs. family history includes Alcohol abuse in his father and other; Heart disease in his  other. No Known Allergies   Review of Systems  Constitutional: Positive for fatigue. Negative for chills and fever.  Respiratory: Positive for cough. Negative for shortness of breath and wheezing.   Cardiovascular: Negative for chest pain.  Gastrointestinal: Negative for nausea and vomiting.       Objective:   Physical Exam  Constitutional: He appears well-developed and well-nourished.  HENT:  Right Ear: External ear normal.  Left Ear: External ear normal.  Mouth/Throat: Oropharynx is clear and moist.  Neck: Neck supple.  Cardiovascular: Normal rate and regular rhythm.  Pulmonary/Chest: Effort normal and breath sounds normal. No respiratory distress. He has no rales.  Musculoskeletal: He exhibits no edema.  Lymphadenopathy:    He has no cervical adenopathy.       Assessment:     Cough. ? Viral. Patient is complaining of increased fatigue-likely related to poor sleep quality from prednisone over the past couple days and non-stop coughing. Marland Kitchen He does not have any rales on exam or any hypoxia or any fever    Plan:     -Discontinue prednisone at this time along with Tessalon -Tussionex 1 teaspoon daily at bedtime when necessary for severe coughing -Is encouraged take a couple days off work and stay out of the heat and stay well-hydrated -Follow-up immediately for any fever or increasing shortness of breath -Consider  chest x-ray if symptoms not improving over the next couple days  Eulas Post MD [place] [facility]

## 2017-12-22 NOTE — Patient Instructions (Signed)
Stop the prednisone and the benzonatate  Finish out the Zithromax  Take the Tussionex as needed at night  Follow up for any fever or increased shortness of breath.

## 2017-12-24 ENCOUNTER — Telehealth: Payer: Self-pay | Admitting: Family Medicine

## 2017-12-24 DIAGNOSIS — R05 Cough: Secondary | ICD-10-CM

## 2017-12-24 DIAGNOSIS — R059 Cough, unspecified: Secondary | ICD-10-CM

## 2017-12-24 NOTE — Telephone Encounter (Signed)
Left detailed message on machine for wife to offer an office visit and to inform her that Dr Elease Hashimoto is out of the office today.

## 2017-12-24 NOTE — Telephone Encounter (Signed)
Copied from [place] [id number]. Topic: Quick Communication - See Telephone Encounter >> Dec 24, 2017  1:23 PM Rutherford Nail, [place] wrote: CRM for notification. See Telephone encounter for: 12/24/17. Patient's wife calling and states that the patient is not feeling better. Cough medication helped with sleeping through the night, but patient still coughs all day and is congested. Just feels really bad. Is there anything else that can be done? CB#: [phone] CB#: [phone]

## 2017-12-25 ENCOUNTER — Telehealth: Payer: Self-pay | Admitting: Family Medicine

## 2017-12-25 ENCOUNTER — Ambulatory Visit (INDEPENDENT_AMBULATORY_CARE_PROVIDER_SITE_OTHER)
Admission: RE | Admit: 2017-12-25 | Discharge: 2017-12-25 | Disposition: A | Discharge status: Home / Self Care | Payer: PPO | Source: Ambulatory Visit | Attending: Family Medicine | Admitting: Family Medicine

## 2017-12-25 DIAGNOSIS — R05 Cough: Secondary | ICD-10-CM | POA: Diagnosis not present

## 2017-12-25 DIAGNOSIS — R059 Cough, unspecified: Secondary | ICD-10-CM

## 2017-12-25 MED ORDER — AMOXICILLIN-POT CLAVULANATE 875-125 MG PO TABS: 1.0000 | ORAL_TABLET | Freq: Two times a day (BID) | ORAL | 0 refills | Status: DC

## 2017-12-25 MED ORDER — HYDROCODONE-HOMATROPINE 5-1.5 MG/5ML PO SYRP: 5.0000 mL | ORAL_SOLUTION | Freq: Four times a day (QID) | ORAL | 0 refills | Status: AC | PRN

## 2017-12-25 NOTE — Telephone Encounter (Signed)
I would go ahead and get CXR ordered for today. Would have  Him try to get as soon as possible.

## 2017-12-25 NOTE — Telephone Encounter (Signed)
Patient called back stating he is not feeling any better. His cough has been severe. He's had some hangover drowsiness with Tussionex. That does help his cough but at half teaspoon was not controlling his cough. He would like to consider shorter acting cough medication. We recommended chest x-ray which was done earlier this morning which showed question of some atelectasis but no evidence for effusion or infiltrate.  We like to go ahead and cover with Augmentin 875 mg twice daily for 10 days and Hycodan cough syrup 1 teaspoon every 6 hours when necessary for severe cough. He is instructed to follow-up immediately for any fever, increasing shortness of breath, or other concern

## 2017-12-25 NOTE — Telephone Encounter (Signed)
Patient is aware and x-ray ordered.

## 2017-12-29 DIAGNOSIS — H903 Sensorineural hearing loss, bilateral: Secondary | ICD-10-CM | POA: Insufficient documentation

## 2017-12-29 DIAGNOSIS — H90A32 Mixed conductive and sensorineural hearing loss, unilateral, left ear with restricted hearing on the contralateral side: Secondary | ICD-10-CM | POA: Diagnosis not present

## 2017-12-29 DIAGNOSIS — H65112 Acute and subacute allergic otitis media (mucoid) (sanguinous) (serous), left ear: Secondary | ICD-10-CM | POA: Diagnosis not present

## 2017-12-29 DIAGNOSIS — J302 Other seasonal allergic rhinitis: Secondary | ICD-10-CM | POA: Diagnosis not present

## 2017-12-29 DIAGNOSIS — H6502 Acute serous otitis media, left ear: Secondary | ICD-10-CM | POA: Insufficient documentation

## 2017-12-29 DIAGNOSIS — J069 Acute upper respiratory infection, unspecified: Secondary | ICD-10-CM | POA: Insufficient documentation

## 2017-12-29 DIAGNOSIS — H9192 Unspecified hearing loss, left ear: Secondary | ICD-10-CM | POA: Diagnosis not present

## 2018-01-03 DIAGNOSIS — H90A32 Mixed conductive and sensorineural hearing loss, unilateral, left ear with restricted hearing on the contralateral side: Secondary | ICD-10-CM | POA: Insufficient documentation

## 2018-01-31 ENCOUNTER — Encounter: Payer: Self-pay | Admitting: Family Medicine

## 2018-02-01 ENCOUNTER — Encounter: Payer: Self-pay | Admitting: Family Medicine

## 2018-02-01 ENCOUNTER — Ambulatory Visit (INDEPENDENT_AMBULATORY_CARE_PROVIDER_SITE_OTHER): Payer: PPO | Admitting: Family Medicine

## 2018-02-01 VITALS — BP 120/76 | HR 75 | Temp 98.1°F | Wt 185.7 lb

## 2018-02-01 DIAGNOSIS — T162XXA Foreign body in left ear, initial encounter: Secondary | ICD-10-CM | POA: Diagnosis not present

## 2018-02-01 MED ORDER — NEOMYCIN-POLYMYXIN-HC 1 % OT SOLN: 3.0000 [drp] | Freq: Four times a day (QID) | OTIC | 0 refills | Status: DC

## 2018-02-01 NOTE — Progress Notes (Signed)
  Subjective:     Patient ID: Jermaine Brady, male   DOB: 1950/11/06, 67 y.o.   MRN: [id number]  HPI  Patient seen with one-week history of left earache. He had recent viral URI. Was subsequently seen at an urgent care and diagnosed with "mucoid" otitis media. He was referred to ENT and treated with some prednisone and was doing better until about one week ago. He denies any hearing changes. No ear drainage. No fever.  Past Medical History:  Diagnosis Date  . ALLERGIC RHINITIS 10/23/2008  . Arthritis    "neck" (05/19/2016)  . DIVERTICULOSIS, COLON 10/23/2008  . Heart murmur   . Hepatitis C    "don't know when I contracted this; went thru treatment in ~ 2007-2008"  . History of blood transfusion 2008?   "during tx to cure my Hep C"  . History of kidney stones   . HYPERTENSION 10/23/2008   Past Surgical History:  Procedure Laterality Date  . ABDOMINAL HERNIA REPAIR  2005  . ASD REPAIR  1971  . ASD REPAIR  age 65  . CARDIAC CATHETERIZATION  1960s - 1970s X 2   "at [facility]"  . COLON SURGERY  2007   ,resection, diverticular rupture  . HERNIA REPAIR  2005  . SHOULDER ARTHROSCOPY W/ ROTATOR CUFF REPAIR Right 2014    reports that he has never smoked. He has never used smokeless tobacco. He reports that he drinks about 1.2 oz of alcohol per week. He reports that he does not use drugs. family history includes Alcohol abuse in his father and other; Heart disease in his other. No Known Allergies  Review of Systems  Constitutional: Negative for chills and fever.  HENT: Positive for ear pain. Negative for congestion, ear discharge and hearing loss.        Objective:   Physical Exam  HENT:  Right canal and right eardrum are normal.  Left canal reveals foreign body-piece of his hearing aid apparently broke off  Using alligator forceps were able to retrieve the foreign body which is part of his hearing aid.  He does have some residual erythema.  within the canal  Eardrum normal   Cardiovascular: Normal rate and regular rhythm.       Assessment:     Foreign body involving left ear canal    Plan:     -Retrieved with alligator forceps. He does have some irritation of the canal.  Cortisporin Otic suspension to use 3 drops left ear 4 times a day if he has any persistent or progressive ear pain  Eulas Post MD [place] [facility] at [facility]

## 2018-05-18 NOTE — Progress Notes (Signed)
Cardiology Office Note:    Date:  05/19/2018   ID:  Jermaine Brady, DOB 1950/12/02, MRN [id number]  PCP:  Eulas Post, MD  Cardiologist:  Sinclair Grooms, MD   Referring MD: Eulas Post, MD   Chief Complaint  Patient presents with  . Hypertension  . Advice Only    ASD    History of Present Illness:    Jermaine Brady is a 67 y.o. male with a hx of HTN, ASD s/p repair at age 12 (with subsequent lapse in cardiology follow up), BPH and hepatitis C. Recent chest and arm pain.  He is doing great.  He still works.  He is not limited.  He has had intermittent left shoulder, left neck, and subclavicular discomfort.  It comes and goes.  These complaints were evaluated 1 year ago with both echocardiography and stress testing both of which were unremarkable.  He has no limitations in his daily activities.  He specifically denies exertion related chest pain.  There is no orthopnea, PND, or swelling.  He has not had syncope.  He did have an episode, in 2017, where his heart rate increased and he felt significant palpitations.  The episode was frightening, occurred while driving, and lasted 30 seconds.  It completely resolved and he felt normal but went to the emergency department at the [facility] ED and was evaluated.  No abnormalities were seen.   Past Medical History:  Diagnosis Date  . ALLERGIC RHINITIS 10/23/2008  . Arthritis    "neck" (05/19/2016)  . DIVERTICULOSIS, COLON 10/23/2008  . Heart murmur   . Hepatitis C    "don't know when I contracted this; went thru treatment in ~ 2007-2008"  . History of blood transfusion 2008?   "during tx to cure my Hep C"  . History of kidney stones   . HYPERTENSION 10/23/2008    Past Surgical History:  Procedure Laterality Date  . ABDOMINAL HERNIA REPAIR  2005  . ASD REPAIR  1971  . ASD REPAIR  age 30  . CARDIAC CATHETERIZATION  1960s - 1970s X 2   "at [facility]"  . COLON SURGERY  2007   ,resection, diverticular  rupture  . HERNIA REPAIR  2005  . SHOULDER ARTHROSCOPY W/ ROTATOR CUFF REPAIR Right 2014    Current Medications: Current Meds  Medication Sig  . aspirin EC 81 MG tablet Take 81 mg by mouth at bedtime.  Marland Kitchen azelastine (ASTELIN) 0.1 % nasal spray Place 2 sprays into both nostrils 2 (two) times daily. Use in each nostril as directed  . Cholecalciferol (VITAMIN D3) 1000 UNITS CAPS Take 1,000 Units by mouth at bedtime.   . fexofenadine (ALLEGRA) 180 MG tablet Take 180 mg by mouth daily as needed for allergies.   Marland Kitchen lisinopril-hydrochlorothiazide (PRINZIDE,ZESTORETIC) 20-12.5 MG tablet Take 1 tablet by mouth every day (need appointment)  . naproxen sodium (ALEVE) 220 MG tablet Take 440 mg by mouth 2 (two) times daily as needed (pain/headache).  . Omega-3 Fatty Acids (FISH OIL PO) Take 1 capsule by mouth at bedtime.   . sildenafil (REVATIO) 20 MG tablet Take 40 mg by mouth daily as needed (erectile dysfunction).  . Tamsulosin HCl (FLOMAX) 0.4 MG CAPS Take 0.4 mg by mouth at bedtime.      Allergies:   Patient has no known allergies.   Social History   Socioeconomic History  . Marital status: Married    Spouse name: Not on file  . Number of  children: Not on file  . Years of education: Not on file  . Highest education level: Not on file  Occupational History  . Not on file  Social Needs  . Financial resource strain: Not on file  . Food insecurity:    Worry: Not on file    Inability: Not on file  . Transportation needs:    Medical: Not on file    Non-medical: Not on file  Tobacco Use  . Smoking status: Never Smoker  . Smokeless tobacco: Never Used  Substance and Sexual Activity  . Alcohol use: Yes    Alcohol/week: 2.0 standard drinks    Types: 2 Cans of beer per week  . Drug use: No  . Sexual activity: Yes  Lifestyle  . Physical activity:    Days per week: Not on file    Minutes per session: Not on file  . Stress: Not on file  Relationships  . Social connections:    Talks on  phone: Not on file    Gets together: Not on file    Attends religious service: Not on file    Active member of club or organization: Not on file    Attends meetings of clubs or organizations: Not on file    Relationship status: Not on file  Other Topics Concern  . Not on file  Social History Narrative  . Not on file     Family History: The patient's family history includes Alcohol abuse in his father and other; Heart disease in his other. There is no history of Colon cancer.  ROS:   Please see the history of present illness.    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  Also known to have a torn rotator cuff in the left shoulder.  Prior therapy of right rotator cuff with surgery.  All other systems reviewed and are negative.  EKGs/Labs/Other Studies Reviewed:    The following studies were reviewed today: Echocardiogram 2018: Study Conclusions   - Left ventricle: The cavity size was normal. There was moderate   focal basal hypertrophy of the septum with otherwise mild   concentric hypertrophy. Systolic function was normal. The   estimated ejection fraction was in the range of 60% to 65%. Wall   motion was normal; there were no regional wall motion   abnormalities. Doppler parameters are consistent with abnormal   left ventricular relaxation (grade 1 diastolic dysfunction). - Aortic valve: Transvalvular velocity was within the normal range.   There was no stenosis. There was no regurgitation. - Mitral valve: Transvalvular velocity was within the normal range.   There was no evidence for stenosis. There was no regurgitation. - Right ventricle: The cavity size was normal. Wall thickness was   normal. Systolic function was normal. - Atrial septum: No defect or patent foramen ovale was identified. - Tricuspid valve: There was trivial regurgitation. - Pulmonary arteries: Systolic pressure was within the normal   range. PA peak pressure: 20 mm Hg (S).  Exercise  treadmill test 2018: Conclusions   Result status: Final result     Blood pressure demonstrated a normal response to exercise.  There was no ST segment deviation noted during stress.   ETT with fair exercise tolerance (8:01); no chest pain; normal BP response; no ST changes; negative adequate ETT; Duke treadmill score 8.      EKG:  EKG is not ordered today.  The ekg from 08/15/2017 was personally reviewed today and reveal vertical axis, normal sinus rhythm,  and no significant abnormality.  Recent Labs: 08/14/2017: BUN 22; Creatinine, Ser 1.06; Hemoglobin 16.3; Platelets 148; Potassium 4.3; Sodium 137  Recent Lipid Panel    Component Value Date/Time   CHOL 128 05/13/2017 0803   TRIG 64 05/13/2017 0803   HDL 29 (L) 05/13/2017 0803   CHOLHDL 4.4 05/13/2017 0803   CHOLHDL 4 12/15/2014 0933   VLDL 12.8 12/15/2014 0933   LDLCALC 86 05/13/2017 0803    Physical Exam:    VS:  BP 122/70   Pulse 76   Ht 6\' 1"  (1.854 m)   Wt 188 lb (85.3 kg)   BMI 24.80 kg/m     Wt Readings from Last 3 Encounters:  05/19/18 188 lb (85.3 kg)  02/01/18 185 lb 11.2 oz (84.2 kg)  12/22/17 184 lb (83.5 kg)     GEN:  Well nourished, well developed in no acute distress HEENT: Normal NECK: No JVD. LYMPHATICS: No lymphadenopathy CARDIAC: RRR, no murmur, no gallop, no edema. VASCULAR: 2+ bilateral radial and carotid pulses.  No bruits. RESPIRATORY:  Clear to auscultation without rales, wheezing or rhonchi  ABDOMEN: Soft, non-tender, non-distended, No pulsatile mass, MUSCULOSKELETAL: No deformity  SKIN: Warm and dry NEUROLOGIC:  Alert and oriented x 3 PSYCHIATRIC:  Normal affect   ASSESSMENT:    1. Essential hypertension   2. Chest pain due to myocardial ischemia, unspecified ischemic chest pain type   3. H/O atrial septal defect repair   4. Erectile dysfunction due to arterial insufficiency    PLAN:    In order of problems listed above:  1. Blood pressure is stable with our stated target  being less than 130/80 mmHg.  He should continue to monitor and continue Zestoretic as noted. 2. Chest pain felt to be noncardiac.  Time spent discussing the results of both echocardiogram and stress test done within the past 12 months.  The EKG performed in January when he had an episode of chest discomfort was also reviewed.  Laboratory data was reviewed. 3. Echo done within the past 12 months did not reveal any evidence of right heart enlargement.  LV function was normal. 4. He uses sildenafil for erectile dysfunction.  His lipids are intrinsically normal with LDL less than 100 on no therapy.  Counseling today involved encouragement related to maintaining an active lifestyle with greater than 150 minutes of moderate aerobic activity.  Time spent today was related mostly to discussion of prior cardiac work-up and imaging studies.  Plan 1 year follow-up.  Notify us if recurring episodes of palpitation.  We do not want to miss atrial fibrillation which would be a risk factor for embolic stroke.  We discussed the nature of atrial fibrillation and he understands that the some point he may have to have prolonged monitoring.  We also discussed the potential for purchasing a wearable device to track heart rate and rhythm.  Greater than 50% of the time during this office visit was spent in education, counseling, and coordination of care related to underlying disease process and testing as outlined.    Medication Adjustments/Labs and Tests Ordered: Current medicines are reviewed at length with the patient today.  Concerns regarding medicines are outlined above.  No orders of the defined types were placed in this encounter.  No orders of the defined types were placed in this encounter.   Patient Instructions  Your physician recommends that you continue on your current medications as directed. Please refer to the Current Medication list given to you today.  Your physician wants you to follow-up in:  [place] will receive a reminder letter in the mail two months in advance. If you don't receive a letter, please call our office to schedule the follow-up appointment.     Signed, Sinclair Grooms, MD  05/19/2018 9:41 AM    [place]

## 2018-05-19 ENCOUNTER — Encounter: Payer: Self-pay | Admitting: Interventional Cardiology

## 2018-05-19 ENCOUNTER — Ambulatory Visit (INDEPENDENT_AMBULATORY_CARE_PROVIDER_SITE_OTHER): Payer: PPO | Admitting: Interventional Cardiology

## 2018-05-19 VITALS — BP 122/70 | HR 76 | Ht 73.0 in | Wt 188.0 lb

## 2018-05-19 DIAGNOSIS — I259 Chronic ischemic heart disease, unspecified: Secondary | ICD-10-CM | POA: Diagnosis not present

## 2018-05-19 DIAGNOSIS — I10 Essential (primary) hypertension: Secondary | ICD-10-CM

## 2018-05-19 DIAGNOSIS — Z8774 Personal history of (corrected) congenital malformations of heart and circulatory system: Secondary | ICD-10-CM | POA: Diagnosis not present

## 2018-05-19 DIAGNOSIS — N5201 Erectile dysfunction due to arterial insufficiency: Secondary | ICD-10-CM | POA: Diagnosis not present

## 2018-05-19 NOTE — Patient Instructions (Signed)
Your physician recommends that you continue on your current medications as directed. Please refer to the Current Medication list given to you today.   Your physician wants you to follow-up in: YEAR WITH DR SMITH  You will receive a reminder letter in the mail two months in advance. If you don't receive a letter, please call our office to schedule the follow-up appointment.  

## 2018-06-01 ENCOUNTER — Encounter: Payer: Self-pay | Admitting: Internal Medicine

## 2018-06-07 ENCOUNTER — Other Ambulatory Visit: Payer: Self-pay

## 2018-06-07 ENCOUNTER — Encounter: Payer: Self-pay | Admitting: Family Medicine

## 2018-06-07 ENCOUNTER — Ambulatory Visit (INDEPENDENT_AMBULATORY_CARE_PROVIDER_SITE_OTHER): Payer: PPO | Admitting: Family Medicine

## 2018-06-07 VITALS — BP 120/70 | HR 68 | Temp 98.0°F | Ht 73.0 in | Wt 191.0 lb

## 2018-06-07 DIAGNOSIS — Z0000 Encounter for general adult medical examination without abnormal findings: Secondary | ICD-10-CM | POA: Diagnosis not present

## 2018-06-07 DIAGNOSIS — D126 Benign neoplasm of colon, unspecified: Secondary | ICD-10-CM | POA: Diagnosis not present

## 2018-06-07 DIAGNOSIS — Z23 Encounter for immunization: Secondary | ICD-10-CM

## 2018-06-07 LAB — COMPREHENSIVE METABOLIC PANEL
ALT: 16 U/L (ref 0–53)
AST: 13 U/L (ref 0–37)
Albumin: 4.7 g/dL (ref 3.5–5.2)
Alkaline Phosphatase: 70 U/L (ref 39–117)
BUN: 32 mg/dL — ABNORMAL HIGH (ref 6–23)
CALCIUM: 9.7 mg/dL (ref 8.4–10.5)
CHLORIDE: 102 meq/L (ref 96–112)
CO2: 29 meq/L (ref 19–32)
Creatinine, Ser: 1.16 mg/dL (ref 0.40–1.50)
GFR: 66.5 mL/min (ref >60.00)
Glucose, Bld: 118 mg/dL — ABNORMAL HIGH (ref 70–99)
POTASSIUM: 4.2 meq/L (ref 3.5–5.1)
Sodium: 139 mEq/L (ref 135–145)
Total Bilirubin: 0.7 mg/dL (ref 0.2–1.2)
Total Protein: 6.7 g/dL (ref 6.0–8.3)

## 2018-06-07 LAB — CBC WITH DIFFERENTIAL/PLATELET
BASOS PCT: 0.4 % (ref 0.0–3.0)
Basophils Absolute: 0 10*3/uL (ref 0.0–0.1)
EOS ABS: 0.1 10*3/uL (ref 0.0–0.7)
Eosinophils Relative: 1.9 % (ref 0.0–5.0)
HEMATOCRIT: 46.6 % (ref 39.0–52.0)
Hemoglobin: 16.2 g/dL (ref 13.0–17.0)
LYMPHS PCT: 13 % (ref 12.0–46.0)
Lymphs Abs: 0.9 10*3/uL (ref 0.7–4.0)
MCHC: 34.7 g/dL (ref 30.0–36.0)
MCV: 87.7 fl (ref 78.0–100.0)
Monocytes Absolute: 0.4 10*3/uL (ref 0.1–1.0)
Monocytes Relative: 5.9 % (ref 3.0–12.0)
NEUTROS ABS: 5.2 10*3/uL (ref 1.4–7.7)
Neutrophils Relative %: 78.8 % — ABNORMAL HIGH (ref 43.0–77.0)
PLATELETS: 143 10*3/uL — AB (ref 150.0–400.0)
RBC: 5.32 Mil/uL (ref 4.22–5.81)
RDW: 15 % (ref 11.5–15.5)
WBC: 6.7 10*3/uL (ref 4.0–10.5)

## 2018-06-07 NOTE — Patient Instructions (Signed)
Set up follow up for right arm skin lesion excision.    Consider newer shingles vaccine (Shingrix).  We are setting up repeat colonoscopy.

## 2018-06-07 NOTE — Progress Notes (Signed)
Subjective:     Patient ID: Jermaine Brady, male   DOB: 06-Mar-1951, 67 y.o.   MRN: [id number]  HPI Patient is here for physical exam.  His chronic problems include history of hypertension, history of hepatitis C, history of colon polyps.  He had history of ASD repair back about 40 years ago and he thinks that is when he contacted hepatitis C.  His hepatitis C has been treated previously.  He remains on lisinopril HCTZ for hypertension and blood pressures been well controlled.  Several health maintenance issues addressed:  -Tetanus 5/16 -Patient has received both pneumonia vaccines -Last colonoscopy 06/12/2015 with recommended 3-year follow-up -Still needs flu vaccine -Had previous Zostavax 2014  Past Medical History:  Diagnosis Date  . ALLERGIC RHINITIS 10/23/2008  . Arthritis    "neck" (05/19/2016)  . DIVERTICULOSIS, COLON 10/23/2008  . Heart murmur   . Hepatitis C    "don't know when I contracted this; went thru treatment in ~ 2007-2008"  . History of blood transfusion 2008?   "during tx to cure my Hep C"  . History of kidney stones   . HYPERTENSION 10/23/2008   Past Surgical History:  Procedure Laterality Date  . ABDOMINAL HERNIA REPAIR  2005  . ASD REPAIR  1971  . ASD REPAIR  age 40  . CARDIAC CATHETERIZATION  1960s - 1970s X 2   "at [facility]"  . COLON SURGERY  2007   ,resection, diverticular rupture  . HERNIA REPAIR  2005  . SHOULDER ARTHROSCOPY W/ ROTATOR CUFF REPAIR Right 2014    reports that he has never smoked. He has never used smokeless tobacco. He reports that he drinks about 2.0 standard drinks of alcohol per week. He reports that he does not use drugs. family history includes Alcohol abuse in his father and other; Heart disease in his other. No Known Allergies   Review of Systems  Constitutional: Negative for activity change, appetite change, fatigue and fever.  HENT: Negative for congestion, ear pain and trouble swallowing.   Eyes: Negative for pain and  visual disturbance.  Respiratory: Negative for cough, shortness of breath and wheezing.   Cardiovascular: Negative for chest pain and palpitations.  Gastrointestinal: Negative for abdominal distention, abdominal pain, blood in stool, constipation, diarrhea, nausea, rectal pain and vomiting.  Endocrine: Negative for polydipsia and polyuria.  Genitourinary: Negative for dysuria, hematuria and testicular pain.  Musculoskeletal: Negative for arthralgias and joint swelling.  Skin: Negative for rash.  Neurological: Negative for dizziness, syncope and headaches.  Hematological: Negative for adenopathy.  Psychiatric/Behavioral: Negative for confusion and dysphoric mood.       Objective:   Physical Exam  Constitutional: He is oriented to person, place, and time. He appears well-developed and well-nourished. No distress.  HENT:  Head: Normocephalic and atraumatic.  Right Ear: External ear normal.  Left Ear: External ear normal.  Mouth/Throat: Oropharynx is clear and moist.  Eyes: Pupils are equal, round, and reactive to light. Conjunctivae and EOM are normal.  Neck: Normal range of motion. Neck supple. No thyromegaly present.  Cardiovascular: Normal rate, regular rhythm and normal heart sounds.  No murmur heard. Pulmonary/Chest: No respiratory distress. He has no wheezes. He has no rales.  Abdominal: Soft. Bowel sounds are normal. He exhibits no distension and no mass. There is no tenderness. There is no rebound and no guarding.  Musculoskeletal: He exhibits no edema.  Lymphadenopathy:    He has no cervical adenopathy.  Neurological: He is alert and oriented to person, place, and  time. He displays normal reflexes. No cranial nerve deficit.  Skin:  Patient has approximately 1 cm fleshy nodular lesion right posterior arm with some superficial telangiectasias  Psychiatric: He has a normal mood and affect.       Assessment:     #1 physical exam.  Several health maintenance issues addressed  as below  #2 probable basal cell carcinoma right arm    Plan:     -We will set up appointment for excision of nodular lesion right arm in the next couple weeks  -We will set up repeat colonoscopy  -Flu vaccine given  -Check CBC and conference of metabolic panel  -Check on coverage for Shingrix vaccine  Eulas Post MD [facility]

## 2018-06-07 NOTE — Addendum Note (Signed)
Addended by: Anibal Henderson on: 06/07/2018 10:57 AM   Modules accepted: Orders

## 2018-06-09 ENCOUNTER — Other Ambulatory Visit: Payer: Self-pay

## 2018-06-09 ENCOUNTER — Ambulatory Visit (INDEPENDENT_AMBULATORY_CARE_PROVIDER_SITE_OTHER): Payer: PPO | Admitting: Family Medicine

## 2018-06-09 ENCOUNTER — Encounter: Payer: Self-pay | Admitting: Family Medicine

## 2018-06-09 VITALS — BP 132/84 | HR 71 | Temp 98.2°F | Ht 73.0 in | Wt 189.3 lb

## 2018-06-09 DIAGNOSIS — C44612 Basal cell carcinoma of skin of right upper limb, including shoulder: Secondary | ICD-10-CM | POA: Diagnosis not present

## 2018-06-09 DIAGNOSIS — R229 Localized swelling, mass and lump, unspecified: Secondary | ICD-10-CM

## 2018-06-09 NOTE — Progress Notes (Signed)
  Subjective:     Patient ID: Jermaine Brady, male   DOB: [birth year], 67 y.o.   MRN: [id number]  HPI Patient seen for shave excision of nodular lesion right posterior arm.  This is a procedure only visit. This was noted incidentally on physical couple days ago.  He states this has been growing at least over the past year.  No bleeding.  No itching.  No pain.  No past history of skin cancer.  Past Medical History:  Diagnosis Date  . ALLERGIC RHINITIS 10/23/2008  . Arthritis    "neck" (05/19/2016)  . DIVERTICULOSIS, COLON 10/23/2008  . Heart murmur   . Hepatitis C    "don't know when I contracted this; went thru treatment in ~ 2007-2008"  . History of blood transfusion 2008?   "during tx to cure my Hep C"  . History of kidney stones   . HYPERTENSION 10/23/2008   Past Surgical History:  Procedure Laterality Date  . ABDOMINAL HERNIA REPAIR  2005  . ASD REPAIR  1971  . ASD REPAIR  age 32  . CARDIAC CATHETERIZATION  1960s - 1970s X 2   "at [facility]"  . COLON SURGERY  2007   ,resection, diverticular rupture  . HERNIA REPAIR  2005  . SHOULDER ARTHROSCOPY W/ ROTATOR CUFF REPAIR Right 2014    reports that he has never smoked. He has never used smokeless tobacco. He reports that he drinks about 2.0 standard drinks of alcohol per week. He reports that he does not use drugs. family history includes Alcohol abuse in his father and other; Heart disease in his other. No Known Allergies   Review of Systems  Constitutional: Negative for appetite change and unexpected weight change.  Hematological: Negative for adenopathy.       Objective:   Physical Exam  Constitutional: He appears well-developed and well-nourished.  Cardiovascular: Normal rate.  Skin:  Patient has approximately 1 cm elevated nodular lesion right posterior arm.  Mild umbilication of the center and he has some superficial telangiectasias.       Assessment:     Skin nodular lesion right posterior arm.  Rule out basal cell  carcinoma.  He is here for diagnostic biopsy.    Plan:     Discussed risks and benefits of shave excision including risks of bleeding, bruising, scar formation, and infection and patient consented to proceed.  Anesthesia with 1% xylocaine with epinephrine.  Skin prepped with betadine and alcohol and shave excision of lesion with #15 blade with minimal bleeding.  We used a metal curette to scrape the base and no residual tissue was noted we used silver nitrate swab to control some mild bleeding .  patient tolerated well.  Vaseline and dressing applied.  Specimen sent to [facility] pathology for further evaluation.  Wound care instruction given.  Follow-up for signs of secondary infection  Eulas Post MD [facility]

## 2018-06-09 NOTE — Patient Instructions (Signed)
Keep wound dry for the first 24 hours then clean daily with soap and water for one week. Apply vaseline daily for 3-4 days. Keep covered with clean dressing for 4-5 days. Follow up promptly for any signs of infection such as redness, warmth, pain, or drainage. We will call you regarding pathology results.  Should be back in 1-2 weeks.

## 2018-06-11 ENCOUNTER — Encounter: Payer: Self-pay | Admitting: Internal Medicine

## 2018-07-09 ENCOUNTER — Ambulatory Visit (AMBULATORY_SURGERY_CENTER): Payer: Self-pay | Admitting: *Deleted

## 2018-07-09 VITALS — Ht 73.0 in | Wt 191.0 lb

## 2018-07-09 DIAGNOSIS — Z86010 Personal history of colonic polyps: Secondary | ICD-10-CM

## 2018-07-09 MED ORDER — NA SULFATE-K SULFATE-MG SULF 17.5-3.13-1.6 GM/177ML PO SOLN: ORAL | 0 refills | Status: DC

## 2018-07-09 NOTE — Progress Notes (Signed)
Patient denies any allergies to eggs or soy. Patient denies any problems with anesthesia/sedation. Patient denies any oxygen use at home. Patient denies taking any diet/weight loss medications or blood thinners. EMMI education offered, pt declined. Patient did not want to drink the Golytely, he wants the Suprep, he was okay with the cost.

## 2018-07-23 ENCOUNTER — Encounter: Payer: PPO | Admitting: Internal Medicine

## 2018-07-26 ENCOUNTER — Encounter: Payer: Self-pay | Admitting: Internal Medicine

## 2018-07-29 DIAGNOSIS — H2513 Age-related nuclear cataract, bilateral: Secondary | ICD-10-CM | POA: Diagnosis not present

## 2018-07-29 DIAGNOSIS — H40013 Open angle with borderline findings, low risk, bilateral: Secondary | ICD-10-CM | POA: Diagnosis not present

## 2018-08-02 ENCOUNTER — Encounter: Payer: Self-pay | Admitting: Family Medicine

## 2018-08-02 DIAGNOSIS — N401 Enlarged prostate with lower urinary tract symptoms: Secondary | ICD-10-CM | POA: Diagnosis not present

## 2018-08-02 DIAGNOSIS — R350 Frequency of micturition: Secondary | ICD-10-CM | POA: Diagnosis not present

## 2018-08-02 DIAGNOSIS — N5201 Erectile dysfunction due to arterial insufficiency: Secondary | ICD-10-CM | POA: Diagnosis not present

## 2018-08-09 ENCOUNTER — Encounter: Payer: PPO | Admitting: Internal Medicine

## 2018-09-03 ENCOUNTER — Encounter: Payer: Self-pay | Admitting: Internal Medicine

## 2018-09-03 ENCOUNTER — Ambulatory Visit (AMBULATORY_SURGERY_CENTER): Payer: PPO | Admitting: Internal Medicine

## 2018-09-03 VITALS — BP 110/65 | HR 63 | Temp 97.5°F | Resp 14 | Ht 73.0 in | Wt 191.0 lb

## 2018-09-03 DIAGNOSIS — Z86010 Personal history of colonic polyps: Secondary | ICD-10-CM | POA: Diagnosis not present

## 2018-09-03 DIAGNOSIS — K635 Polyp of colon: Secondary | ICD-10-CM

## 2018-09-03 DIAGNOSIS — B1920 Unspecified viral hepatitis C without hepatic coma: Secondary | ICD-10-CM | POA: Diagnosis not present

## 2018-09-03 DIAGNOSIS — D123 Benign neoplasm of transverse colon: Secondary | ICD-10-CM

## 2018-09-03 DIAGNOSIS — I10 Essential (primary) hypertension: Secondary | ICD-10-CM | POA: Diagnosis not present

## 2018-09-03 DIAGNOSIS — D122 Benign neoplasm of ascending colon: Secondary | ICD-10-CM

## 2018-09-03 DIAGNOSIS — D120 Benign neoplasm of cecum: Secondary | ICD-10-CM

## 2018-09-03 MED ORDER — SODIUM CHLORIDE 0.9 % IV SOLN: 500.0000 mL | Freq: Once | INTRAVENOUS | Status: DC

## 2018-09-03 NOTE — Progress Notes (Signed)
Called to room to assist during endoscopic procedure.  Patient ID and intended procedure confirmed with present staff. Received instructions for my participation in the procedure from the performing physician.  

## 2018-09-03 NOTE — Patient Instructions (Signed)
Handouts given on Polyps and Diverticulosis.  YOU HAD AN ENDOSCOPIC PROCEDURE TODAY AT [facility]:   Refer to the procedure report that was given to you for any specific questions about what was found during the examination.  If the procedure report does not answer your questions, please call your gastroenterologist to clarify.  If you requested that your care partner not be given the details of your procedure findings, then the procedure report has been included in a sealed envelope for you to review at your convenience later.  YOU SHOULD EXPECT: Some feelings of bloating in the abdomen. Passage of more gas than usual.  Walking can help get rid of the air that was put into your GI tract during the procedure and reduce the bloating. If you had a lower endoscopy (such as a colonoscopy or flexible sigmoidoscopy) you may notice spotting of blood in your stool or on the toilet paper. If you underwent a bowel prep for your procedure, you may not have a normal bowel movement for a few days.  Please Note:  You might notice some irritation and congestion in your nose or some drainage.  This is from the oxygen used during your procedure.  There is no need for concern and it should clear up in a day or so.  SYMPTOMS TO REPORT IMMEDIATELY:   Following lower endoscopy (colonoscopy or flexible sigmoidoscopy):  Excessive amounts of blood in the stool  Significant tenderness or worsening of abdominal pains  Swelling of the abdomen that is new, acute  Fever of 100F or higher   Following upper endoscopy (EGD)  Vomiting of blood or coffee ground material  New chest pain or pain under the shoulder blades  Painful or persistently difficult swallowing  New shortness of breath  Fever of 100F or higher  Black, tarry-looking stools  For urgent or emergent issues, a gastroenterologist can be reached at any hour by calling [phone].   DIET:  We do recommend a small meal at first, but  then you may proceed to your regular diet.  Drink plenty of fluids but you should avoid alcoholic beverages for 24 hours.  ACTIVITY:  You should plan to take it easy for the rest of today and you should NOT DRIVE or use heavy machinery until tomorrow (because of the sedation medicines used during the test).    FOLLOW UP: Our staff will call the number listed on your records the next business day following your procedure to check on you and address any questions or concerns that you may have regarding the information given to you following your procedure. If we do not reach you, we will leave a message.  However, if you are feeling well and you are not experiencing any problems, there is no need to return our call.  We will assume that you have returned to your regular daily activities without incident.  If any biopsies were taken you will be contacted by phone or by letter within the next 1-3 weeks.  Please call us at [phone] if you have not heard about the biopsies in 3 weeks.    SIGNATURES/CONFIDENTIALITY: You and/or your care partner have signed paperwork which will be entered into your electronic medical record.  These signatures attest to the fact that that the information above on your After Visit Summary has been reviewed and is understood.  Full responsibility of the confidentiality of this discharge information lies with you and/or your care-partner.

## 2018-09-03 NOTE — Progress Notes (Signed)
Report given to PACU, vss 

## 2018-09-03 NOTE — Op Note (Signed)
Jermaine Brady Patient Name: Jermaine Brady Procedure Date: 09/03/2018 7:31 AM MRN: [id number] Endoscopist: Jerene Bears , MD Age: 68 Referring MD:  Date of Birth: 28-Nov-1950 Gender: Male Account #: [id number] Procedure:                Colonoscopy Indications:              High risk colon cancer surveillance: Personal                            history of non-advanced adenoma, High risk colon                            cancer surveillance: Personal history of sessile                            serrated colon polyp (less than 10 mm in size) with                            no dysplasia, Last colonoscopy 3 years ago Medicines:                Monitored Anesthesia Care Procedure:                Pre-Anesthesia Assessment:                           - Prior to the procedure, a History and Physical                            was performed, and patient medications and                            allergies were reviewed. The patient's tolerance of                            previous anesthesia was also reviewed. The risks                            and benefits of the procedure and the sedation                            options and risks were discussed with the patient.                            All questions were answered, and informed consent                            was obtained. Prior Anticoagulants: The patient has                            taken no previous anticoagulant or antiplatelet                            agents. ASA Grade Assessment: II - A patient with  mild systemic disease. After reviewing the risks                            and benefits, the patient was deemed in                            satisfactory condition to undergo the procedure.                           After obtaining informed consent, the colonoscope                            was passed under direct vision. Throughout the                            procedure, the patient's  blood pressure, pulse, and                            oxygen saturations were monitored continuously. The                            Colonoscope was introduced through the anus and                            advanced to the cecum, identified by appendiceal                            orifice and ileocecal valve. The colonoscopy was                            performed without difficulty. The patient tolerated                            the procedure well. The quality of the bowel                            preparation was good. The ileocecal valve,                            appendiceal orifice, and rectum were photographed. Scope In: 7:39:45 AM Scope Out: 8:00:39 AM Scope Withdrawal Time: 0 hours 16 minutes 2 seconds  Total Procedure Duration: 0 hours 20 minutes 54 seconds  Findings:                 The digital rectal exam was normal.                           A 2 mm polyp was found in the cecum. The polyp was                            sessile. The polyp was removed with a cold biopsy                            forceps. Resection and retrieval  were complete.                           Three sessile polyps were found in the ascending                            colon. The polyps were 4 to 6 mm in size. These                            polyps were removed with a cold snare. Resection                            and retrieval were complete.                           A 3 mm polyp was found in the transverse colon. The                            polyp was sessile. The polyp was removed with a                            cold snare. Resection and retrieval were complete.                           Multiple small and large-mouthed diverticula were                            found in the left colon.                           There was evidence of a prior end-to-end                            colo-colonic anastomosis in the sigmoid colon. This                            was patent.                            The retroflexed view of the distal rectum and anal                            verge was normal and showed no anal or rectal                            abnormalities. Complications:            No immediate complications. Estimated Blood Loss:     Estimated blood loss was minimal. Impression:               - One 2 mm polyp in the cecum, removed with a cold                            biopsy forceps. Resected and retrieved.                           -  Three 4 to 6 mm polyps in the ascending colon,                            removed with a cold snare. Resected and retrieved.                           - One 3 mm polyp in the transverse colon, removed                            with a cold snare. Resected and retrieved.                           - Diverticulosis in the left colon.                           - Patent end-to-end colo-colonic anastomosis.                           - The distal rectum and anal verge are normal on                            retroflexion view. Recommendation:           - Patient has a contact number available for                            emergencies. The signs and symptoms of potential                            delayed complications were discussed with the                            patient. Return to normal activities tomorrow.                            Written discharge instructions were provided to the                            patient.                           - Resume previous diet.                           - Continue present medications.                           - Await pathology results.                           - Repeat colonoscopy is recommended for                            surveillance. The colonoscopy date will be  determined after pathology results from today's                            exam become available for review. Jerene Bears, MD 09/03/2018 8:05:43 AM This report has been signed electronically.

## 2018-09-06 ENCOUNTER — Telehealth: Payer: Self-pay

## 2018-09-06 NOTE — Telephone Encounter (Signed)
  Follow up Call-  Call back number 09/03/2018  Post procedure Call Back phone  # [phone]  Permission to leave phone message Yes  Some recent data might be hidden     Patient questions:  Do you have a fever, pain , or abdominal swelling? No Pain Score  0  Have you tolerated food without any problems? Yes  Have you been able to return to your normal activities? Yes  Do you have any questions about your discharge instructions: Diet   No Medications  No Follow up visit  No  Do you have questions or concerns about your Care? No  Actions: * If pain score is 4 or above: No action needed, pain <4

## 2018-09-09 ENCOUNTER — Encounter: Payer: Self-pay | Admitting: Internal Medicine

## 2018-09-20 ENCOUNTER — Other Ambulatory Visit: Payer: Self-pay | Admitting: Family Medicine

## 2018-09-24 ENCOUNTER — Other Ambulatory Visit: Payer: Self-pay

## 2018-09-24 ENCOUNTER — Ambulatory Visit (INDEPENDENT_AMBULATORY_CARE_PROVIDER_SITE_OTHER): Payer: PPO | Admitting: Family Medicine

## 2018-09-24 ENCOUNTER — Encounter: Payer: Self-pay | Admitting: Family Medicine

## 2018-09-24 VITALS — BP 132/76 | HR 67 | Temp 97.7°F | Ht 73.0 in | Wt 188.5 lb

## 2018-09-24 DIAGNOSIS — R1310 Dysphagia, unspecified: Secondary | ICD-10-CM | POA: Diagnosis not present

## 2018-09-24 DIAGNOSIS — K219 Gastro-esophageal reflux disease without esophagitis: Secondary | ICD-10-CM | POA: Diagnosis not present

## 2018-09-24 MED ORDER — PANTOPRAZOLE SODIUM 40 MG PO TBEC: 40.0000 mg | DELAYED_RELEASE_TABLET | Freq: Every day | ORAL | 1 refills | Status: DC

## 2018-09-24 NOTE — Patient Instructions (Signed)
Food Choices for Gastroesophageal Reflux Disease, Adult When you have gastroesophageal reflux disease (GERD), the foods you eat and your eating habits are very important. Choosing the right foods can help ease the discomfort of GERD. Consider working with a diet and nutrition specialist (dietitian) to help you make healthy food choices. What general guidelines should I follow?  Eating plan  Choose healthy foods low in fat, such as fruits, vegetables, whole grains, low-fat dairy products, and lean meat, fish, and poultry.  Eat frequent, small meals instead of three large meals each day. Eat your meals slowly, in a relaxed setting. Avoid bending over or lying down until 2-3 hours after eating.  Limit high-fat foods such as fatty meats or fried foods.  Limit your intake of oils, butter, and shortening to less than 8 teaspoons each day.  Avoid the following: ? Foods that cause symptoms. These may be different for different people. Keep a food diary to keep track of foods that cause symptoms. ? Alcohol. ? Drinking large amounts of liquid with meals. ? Eating meals during the 2-3 hours before bed.  Cook foods using methods other than frying. This may include baking, grilling, or broiling. Lifestyle  Maintain a healthy weight. Ask your health care provider what weight is healthy for you. If you need to lose weight, work with your health care provider to do so safely.  Exercise for at least 30 minutes on 5 or more days each week, or as told by your health care provider.  Avoid wearing clothes that fit tightly around your waist and chest.  Do not use any products that contain nicotine or tobacco, such as cigarettes and e-cigarettes. If you need help quitting, ask your health care provider.  Sleep with the head of your bed raised. Use a wedge under the mattress or blocks under the bed frame to raise the head of the bed. What foods are not recommended? The items listed may not be a complete  list. Talk with your dietitian about what dietary choices are best for you. Grains Pastries or quick breads with added fat. French toast. Vegetables Deep fried vegetables. French fries. Any vegetables prepared with added fat. Any vegetables that cause symptoms. For some people this may include tomatoes and tomato products, chili peppers, onions and garlic, and horseradish. Fruits Any fruits prepared with added fat. Any fruits that cause symptoms. For some people this may include citrus fruits, such as oranges, grapefruit, pineapple, and lemons. Meats and other protein foods High-fat meats, such as fatty beef or pork, hot dogs, ribs, ham, sausage, salami and bacon. Fried meat or protein, including fried fish and fried chicken. Nuts and nut butters. Dairy Whole milk and chocolate milk. Sour cream. Cream. Ice cream. Cream cheese. Milk shakes. Beverages Coffee and tea, with or without caffeine. Carbonated beverages. Sodas. Energy drinks. Fruit juice made with acidic fruits (such as orange or grapefruit). Tomato juice. Alcoholic drinks. Fats and oils Butter. Margarine. Shortening. Ghee. Sweets and desserts Chocolate and cocoa. Donuts. Seasoning and other foods Pepper. Peppermint and spearmint. Any condiments, herbs, or seasonings that cause symptoms. For some people, this may include curry, hot sauce, or vinegar-based salad dressings. Summary  When you have gastroesophageal reflux disease (GERD), food and lifestyle choices are very important to help ease the discomfort of GERD.  Eat frequent, small meals instead of three large meals each day. Eat your meals slowly, in a relaxed setting. Avoid bending over or lying down until 2-3 hours after eating.  Limit high-fat   foods such as fatty meat or fried foods. This information is not intended to replace advice given to you by your health care provider. Make sure you discuss any questions you have with your health care provider. Document Released:  07/14/2005 Document Revised: 07/15/2016 Document Reviewed: 07/15/2016 Elsevier Interactive Patient Education  2019 [employer].  Avoid eating within 2-3 hours of bedtime  If symptoms resolved in 3-4 weeks let me know.  After symptoms better may transition after 4 weeks back to Ranitidine.

## 2018-09-24 NOTE — Progress Notes (Signed)
  Subjective:     Patient ID: Jermaine Brady, male   DOB: Dec 30, 1950, 68 y.o.   MRN: [id number]  HPI  Pt here for evaluation of a new problem:  Pt is here with some substernal burning and increased belching over the past several days.  He at some pizza on Friday and has had some increased problems since then.  Saturday symptoms especially bad.  He has some mild dysphagia.  Liquids OK.  No recent appetite or weight changes.  He tried some OTC antacids such as TUMS and Zantac with mild improvement.  Still has some symptoms.  No stool changes.  Frequently clearing his throat and increased mucus build up in throat.  No exertional chest pain.  No dyspnea.  Past Medical History:  Diagnosis Date  . ALLERGIC RHINITIS 10/23/2008  . Arthritis    "neck" (05/19/2016)  . DIVERTICULOSIS, COLON 10/23/2008  . Heart murmur   . Hepatitis C    "don't know when I contracted this; went thru treatment in ~ 2007-2008" ="cured per pt"  . History of blood transfusion 2008?   "during tx to cure my Hep C"  . History of kidney stones   . HYPERTENSION 10/23/2008   Past Surgical History:  Procedure Laterality Date  . ABDOMINAL HERNIA REPAIR  2005  . ASD REPAIR  1971  . ASD REPAIR  age 29  . CARDIAC CATHETERIZATION  1960s - 1970s X 2   "at [facility]"  . COLON SURGERY  2007   ,resection, diverticular rupture  . COLONOSCOPY  06/12/2015  . HERNIA REPAIR  2005  . POLYPECTOMY    . SHOULDER ARTHROSCOPY W/ ROTATOR CUFF REPAIR Right 2014    reports that he has never smoked. He has never used smokeless tobacco. He reports current alcohol use of about 2.0 standard drinks of alcohol per week. He reports that he does not use drugs. family history includes Alcohol abuse in his father and another family member; Heart disease in an other family member. No Known Allergies   Review of Systems  Constitutional: Negative for appetite change and unexpected weight change.  HENT: Positive for trouble swallowing.   Respiratory:  Negative for cough and shortness of breath.   Cardiovascular: Negative for chest pain.  Gastrointestinal: Negative for abdominal pain, blood in stool, nausea and vomiting.  Neurological: Negative for dizziness and weakness.       Objective:   Physical Exam Vitals signs reviewed.  Constitutional:      Appearance: Normal appearance.  HENT:     Mouth/Throat:     Pharynx: Oropharynx is clear.  Neck:     Musculoskeletal: Neck supple.  Cardiovascular:     Rate and Rhythm: Normal rate and regular rhythm.  Pulmonary:     Effort: Pulmonary effort is normal.     Breath sounds: Normal breath sounds.  Neurological:     Mental Status: He is alert.        Assessment:     GERD symptome with some mild dysphagia. No red flags such as weight loss or loss of appetite.    Plan:     Reflux precautions and diet discussed Avoid eating 2-3 hours of bedtime Start Protonix 40 mg daily.   If symptoms resolved next few weeks will taper off -GI referral if not improving on the Protonix  Eulas Post MD [facility]

## 2018-10-16 ENCOUNTER — Other Ambulatory Visit: Payer: Self-pay | Admitting: Family Medicine

## 2018-10-21 IMAGING — DX DG CHEST 2V
2 series · 2 of 2 positions shown · non-contrast
Comparison: August 14, 2017

CLINICAL DATA: Cough and congestion.  Hypertension.

EXAM:
CHEST - 2 VIEW

[chest pa]
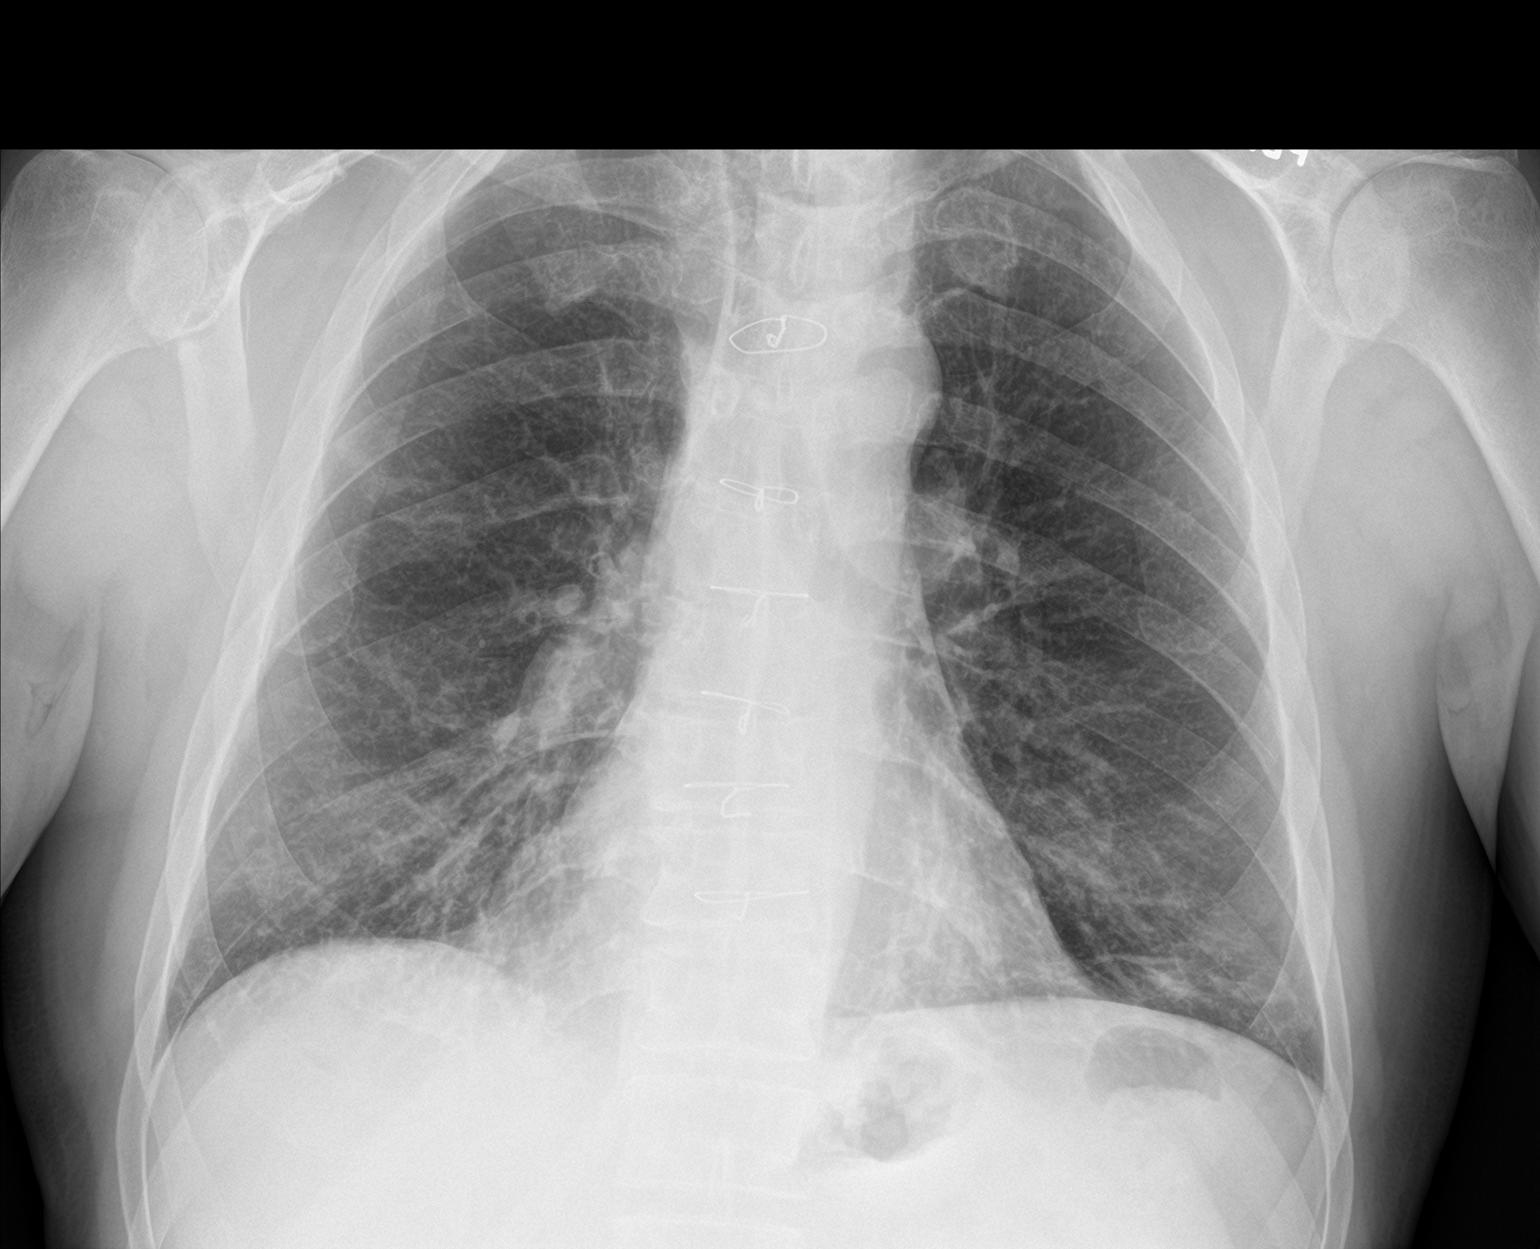

[chest lat]
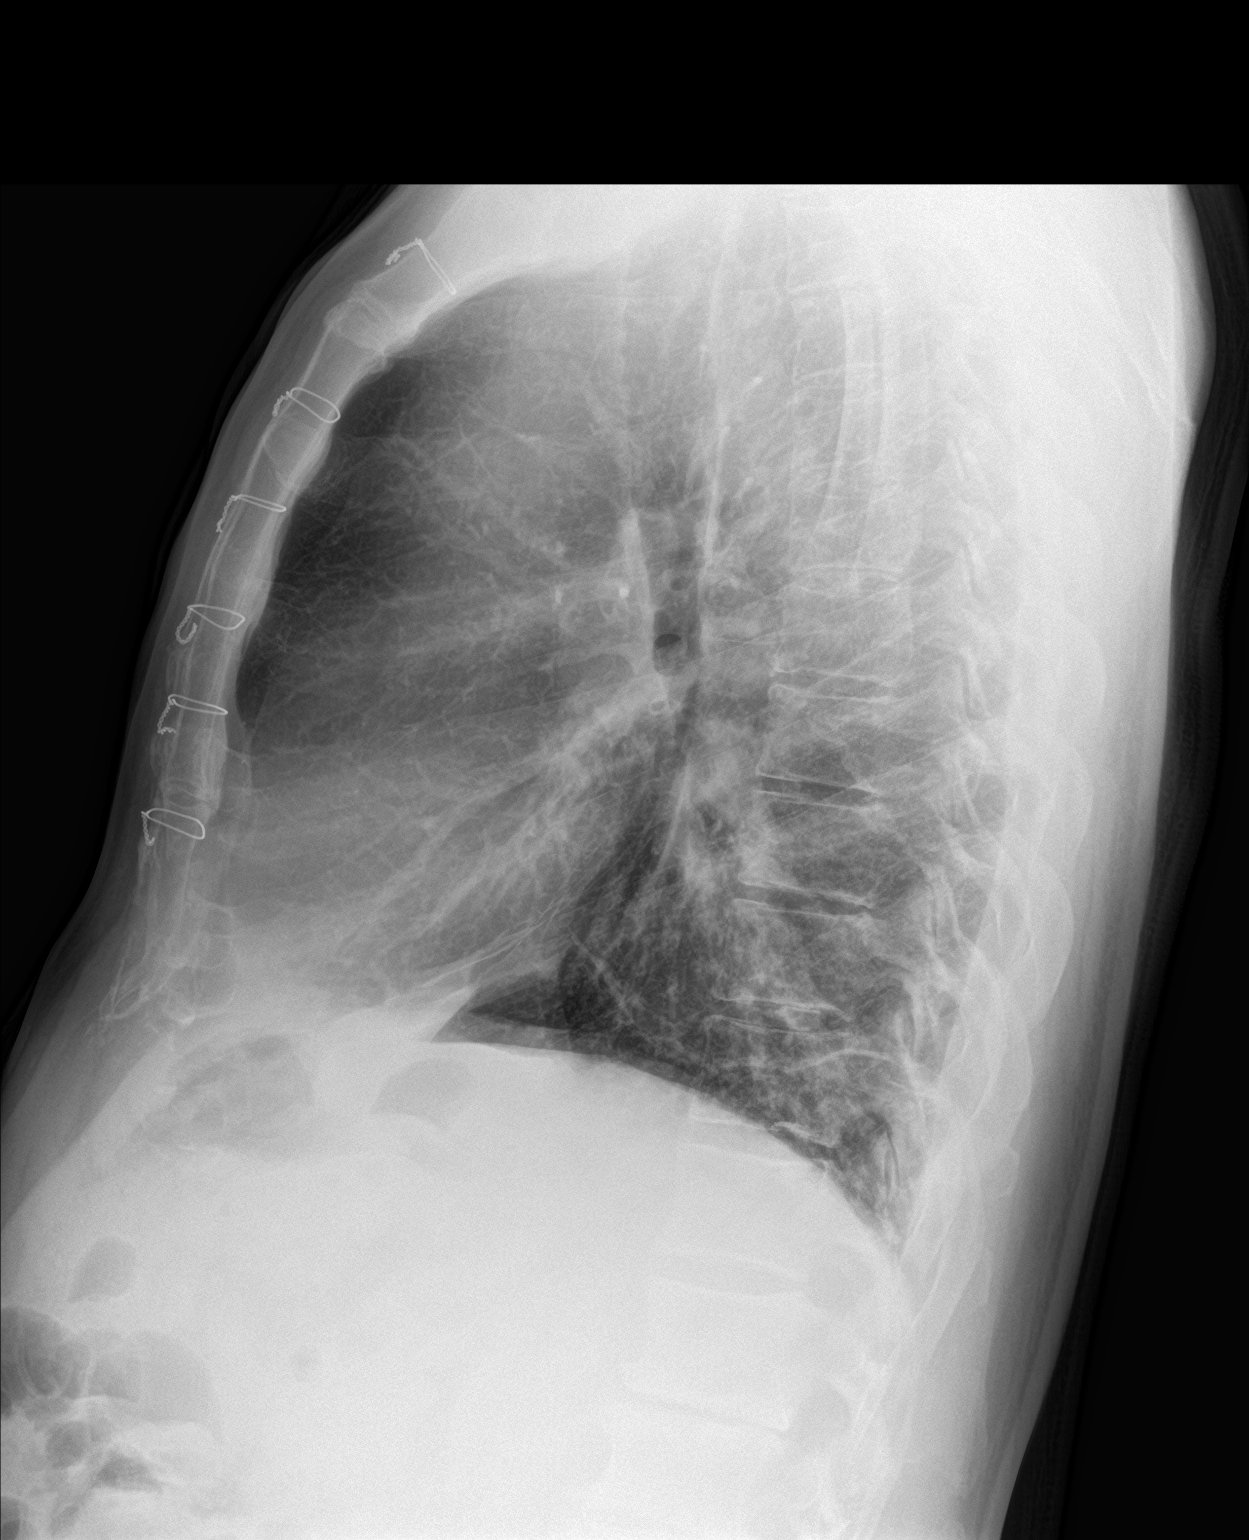

[2 of 2 positions shown; findings below may reference images not displayed]

FINDINGS: There is slight atelectatic change in the left base. Lungs elsewhere
are clear. Heart size and pulmonary vascularity are normal. No
adenopathy. Patient is status post median sternotomy.
IMPRESSION: Slight left base atelectasis. Lungs elsewhere clear. Heart size
normal.

## 2018-11-10 ENCOUNTER — Ambulatory Visit (INDEPENDENT_AMBULATORY_CARE_PROVIDER_SITE_OTHER): Payer: PPO | Admitting: Family Medicine

## 2018-11-10 ENCOUNTER — Other Ambulatory Visit: Payer: Self-pay

## 2018-11-10 DIAGNOSIS — N400 Enlarged prostate without lower urinary tract symptoms: Secondary | ICD-10-CM

## 2018-11-10 DIAGNOSIS — Z0000 Encounter for general adult medical examination without abnormal findings: Secondary | ICD-10-CM

## 2018-11-10 NOTE — Progress Notes (Signed)
Patient ID: Jermaine Brady, male   DOB: [birth year], 68 y.o.   MRN: [id number]  Virtual Visit via Video Note  I connected with Jermaine Brady on 11/10/18 at 11:00 AM EDT by a video enabled telemedicine application and verified that I am speaking with the correct person using two identifiers.  Location patient: home Location provider:work or home office Persons participating in the virtual visit: patient, provider  I discussed the limitations of evaluation and management by telemedicine and the availability of in person appointments. The patient expressed understanding and agreed to proceed.   HPI: Medicare subsequent annual wellness visit.  Patient actually had full physical back in November.  Chronic problems include history of hypertension, colon polyps, diverticulosis, hepatitis C, BPH.  He is followed by urology regarding his BPH symptoms which are stable.  Was seen recently for some increased reflux symptoms and those resolved with Protonix.  He is now back on Pepcid.  He had recent colonoscopy with polyps again noted and has follow-up scheduled for about 3 years from now.  1.  Risk factors based on Past Medical , Social, and Family history reviewed and as indicated below with no changes  2.  Limitations in physical activities None.  No recent falls.  No falls during the past year.  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  Still working full-time  3.  Depression/mood No active depression or anxiety issues PHQ2=0  4.  Hearing No deficits  5.  ADLs independent in all.  6.  Cognitive function (orientation to time and place, language, writing, speech,memory) no short or long term memory issues.  Language and judgement intact.  7.  Home Safety no issues  8.  Height, weight, and visual acuity.all stable.  No recent appetite or weight changes.  Wears glasses.  No recent visual changes.  9.  Counseling discussed -new shingles vaccine.  He  will consider at some point later this year  10. Recommendation of preventive services.  Shingles vaccine as above.  Other immunizations up-to-date  11. Labs based on risk factors-none indicated at this time  12. Care Plan-as above  13. Other Providers-Dr. Hilarie Fredrickson gastroenterology, Dr. Gilford Rile urology, Dr. Daneen Schick cardiology  14. Written schedule of screening/prevention services discussed with patient. -Continue with yearly flu vaccine-next due next fall -Pneumonia vaccinations complete -Past history of hepatitis C and no screening indicated -Tetanus due 12/21/2024 -Colonoscopy due 09/03/2021 -Patient had previous Zostavax.  Will consider Shingrix at some point this year  Advanced directives discussed.  Patient has living will along with medical power of attorney.  They were looking at getting an update prior to current pandemic   ROS: See pertinent positives and negatives per HPI.  Past Medical History:  Diagnosis Date  . ALLERGIC RHINITIS 10/23/2008  . Arthritis    "neck" (05/19/2016)  . DIVERTICULOSIS, COLON 10/23/2008  . Heart murmur   . Hepatitis C    "don't know when I contracted this; went thru treatment in ~ 2007-2008" ="cured per pt"  . History of blood transfusion 2008?   "during tx to cure my Hep C"  . History of kidney stones   . HYPERTENSION 10/23/2008    Past Surgical History:  Procedure Laterality Date  . ABDOMINAL HERNIA REPAIR  2005  . ASD REPAIR  1971  . ASD REPAIR  age 5  . CARDIAC CATHETERIZATION  1960s - 1970s X 2   "at [facility]"  . COLON SURGERY  2007   ,  resection, diverticular rupture  . COLONOSCOPY  06/12/2015  . HERNIA REPAIR  2005  . POLYPECTOMY    . SHOULDER ARTHROSCOPY W/ ROTATOR CUFF REPAIR Right 2014    Family History  Problem Relation Age of Onset  . Alcohol abuse Other   . Heart disease Other   . Alcohol abuse Father   . Colon cancer Neg Hx   . Colon polyps Neg Hx   . Esophageal cancer Neg Hx   . Rectal cancer Neg Hx   . Stomach  cancer Neg Hx     SOCIAL HX: Non-smoker.  Married.  Works full-time in [employer].  No regular alcohol use.   Current Outpatient Medications:  .  aspirin EC 81 MG tablet, Take 81 mg by mouth at bedtime., Disp: , Rfl:  .  azelastine (ASTELIN) 0.1 % nasal spray, Place 2 sprays into both nostrils 2 (two) times daily. Use in each nostril as directed (Patient taking differently: Place 2 sprays into both nostrils as needed. Use in each nostril as directed), Disp: 30 mL, Rfl: 12 .  Cholecalciferol (VITAMIN D3) 1000 UNITS CAPS, Take 1,000 Units by mouth at bedtime. , Disp: , Rfl:  .  fexofenadine (ALLEGRA) 180 MG tablet, Take 180 mg by mouth daily as needed for allergies. , Disp: , Rfl:  .  lisinopril-hydrochlorothiazide (PRINZIDE,ZESTORETIC) 20-12.5 MG tablet, TAKE 1 TABLET BY MOUTH EVERY DAY (NEED APPOINTMENT), Disp: 90 tablet, Rfl: 3 .  naproxen sodium (ALEVE) 220 MG tablet, Take 440 mg by mouth 2 (two) times daily as needed (pain/headache)., Disp: , Rfl:  .  pantoprazole (PROTONIX) 40 MG tablet, TAKE 1 TABLET BY MOUTH EVERY DAY, Disp: 30 tablet, Rfl: 1 .  sildenafil (REVATIO) 20 MG tablet, Take 40 mg by mouth daily as needed (erectile dysfunction)., Disp: , Rfl:  .  Tamsulosin HCl (FLOMAX) 0.4 MG CAPS, Take 0.4 mg by mouth at bedtime. , Disp: , Rfl:   EXAM:  VITALS per patient if applicable:  GENERAL: alert, oriented, appears well and in no acute distress  HEENT: atraumatic, conjunttiva clear, no obvious abnormalities on inspection of external nose and ears  NECK: normal movements of the head and neck  LUNGS: on inspection no signs of respiratory distress, breathing rate appears normal, no obvious gross SOB, gasping or wheezing  CV: no obvious cyanosis  MS: moves all visible extremities without noticeable abnormality  PSYCH/NEURO: pleasant and cooperative, no obvious depression or anxiety, speech and thought processing grossly intact  ASSESSMENT AND PLAN:  Discussed the following  assessment and plan:  Medicare subsequent annual wellness visit  -Discussed Shingrix and he will consider at some point this year -Other vaccines up-to-date -Written schedule of preventative services discussed with patient     I discussed the assessment and treatment plan with the patient. The patient was provided an opportunity to ask questions and all were answered. The patient agreed with the plan and demonstrated an understanding of the instructions.   The patient was advised to call back or seek an in-person evaluation if the symptoms worsen or if the condition fails to improve as anticipated.   Carolann Littler, MD '

## 2018-11-17 ENCOUNTER — Other Ambulatory Visit: Payer: Self-pay | Admitting: Family Medicine

## 2018-11-22 ENCOUNTER — Other Ambulatory Visit: Payer: Self-pay | Admitting: Family Medicine

## 2019-05-09 ENCOUNTER — Ambulatory Visit: Payer: PPO

## 2019-05-09 ENCOUNTER — Ambulatory Visit: Payer: PPO | Admitting: Family Medicine

## 2019-05-09 ENCOUNTER — Other Ambulatory Visit: Payer: Self-pay

## 2019-05-09 DIAGNOSIS — Z23 Encounter for immunization: Secondary | ICD-10-CM

## 2019-06-22 ENCOUNTER — Other Ambulatory Visit: Payer: Self-pay

## 2019-08-05 DIAGNOSIS — N400 Enlarged prostate without lower urinary tract symptoms: Secondary | ICD-10-CM | POA: Diagnosis not present

## 2019-08-05 DIAGNOSIS — R351 Nocturia: Secondary | ICD-10-CM | POA: Diagnosis not present

## 2019-08-05 DIAGNOSIS — N5201 Erectile dysfunction due to arterial insufficiency: Secondary | ICD-10-CM | POA: Diagnosis not present

## 2019-08-05 DIAGNOSIS — Z125 Encounter for screening for malignant neoplasm of prostate: Secondary | ICD-10-CM | POA: Diagnosis not present

## 2019-08-05 DIAGNOSIS — N401 Enlarged prostate with lower urinary tract symptoms: Secondary | ICD-10-CM | POA: Diagnosis not present

## 2019-08-05 LAB — PSA: PSA: 2.18

## 2019-08-09 DIAGNOSIS — H401121 Primary open-angle glaucoma, left eye, mild stage: Secondary | ICD-10-CM | POA: Diagnosis not present

## 2019-08-09 DIAGNOSIS — H401112 Primary open-angle glaucoma, right eye, moderate stage: Secondary | ICD-10-CM | POA: Diagnosis not present

## 2019-08-09 DIAGNOSIS — H2513 Age-related nuclear cataract, bilateral: Secondary | ICD-10-CM | POA: Diagnosis not present

## 2019-08-23 ENCOUNTER — Ambulatory Visit: Payer: PPO

## 2019-09-02 ENCOUNTER — Ambulatory Visit (INDEPENDENT_AMBULATORY_CARE_PROVIDER_SITE_OTHER): Payer: PPO | Admitting: Family Medicine

## 2019-09-02 ENCOUNTER — Encounter: Payer: Self-pay | Admitting: Family Medicine

## 2019-09-02 ENCOUNTER — Other Ambulatory Visit: Payer: Self-pay

## 2019-09-02 VITALS — BP 120/70 | HR 70 | Temp 97.7°F | Wt 194.2 lb

## 2019-09-02 DIAGNOSIS — S76012A Strain of muscle, fascia and tendon of left hip, initial encounter: Secondary | ICD-10-CM | POA: Diagnosis not present

## 2019-09-02 NOTE — Progress Notes (Signed)
  Subjective:     Patient ID: Jermaine Brady, male   DOB: 1951/07/16, 69 y.o.   MRN: [id number]  HPI   Vivien is seen with left lateral hip pain about 2 weeks duration.  Denies any injury.  He is on his feet a lot with work and does quite a bit of walking.  His location is lateral superior hip.  This is above the greater trochanteric bursa region.  Denies any low back pain.  Pain is consistently worse when he is going up stairs or hills especially with planning the left lower extremity.  Does not have any radiation down toward the knee or leg.  No numbness.  No weakness.  Past Medical History:  Diagnosis Date  . ALLERGIC RHINITIS 10/23/2008  . Arthritis    "neck" (05/19/2016)  . DIVERTICULOSIS, COLON 10/23/2008  . Heart murmur   . Hepatitis C    "don't know when I contracted this; went thru treatment in ~ 2007-2008" ="cured per pt"  . History of blood transfusion 2008?   "during tx to cure my Hep C"  . History of kidney stones   . HYPERTENSION 10/23/2008   Past Surgical History:  Procedure Laterality Date  . ABDOMINAL HERNIA REPAIR  2005  . ASD REPAIR  1971  . ASD REPAIR  age 45  . CARDIAC CATHETERIZATION  1960s - 1970s X 2   "at [facility]"  . COLON SURGERY  2007   ,resection, diverticular rupture  . COLONOSCOPY  06/12/2015  . HERNIA REPAIR  2005  . POLYPECTOMY    . SHOULDER ARTHROSCOPY W/ ROTATOR CUFF REPAIR Right 2014    reports that he has never smoked. He has never used smokeless tobacco. He reports current alcohol use of about 2.0 standard drinks of alcohol per week. He reports that he does not use drugs. family history includes Alcohol abuse in his father and another family member; Heart disease in an other family member. No Known Allergies   Review of Systems  Neurological: Negative for weakness and numbness.       Objective:   Physical Exam Vitals reviewed.  Constitutional:      Appearance: Normal appearance.  Cardiovascular:     Rate and Rhythm: Normal rate and  regular rhythm.  Pulmonary:     Effort: Pulmonary effort is normal.     Breath sounds: Normal breath sounds.  Musculoskeletal:     Comments: Full range of motion left hip.  He does not have any tenderness over the greater trochanteric bursa.  He has some mild tenderness to palpation proximal sartorius region.  He has pain with hip flexion against resistance proximal quadriceps muscle region.  Neurological:     Mental Status: He is alert.        Assessment:     Suspect left quadricep strain.  He does not recall specific injury.  No associated weakness.    Plan:     -Recommend cautious trial of over-the-counter anti-inflammatory and take with food. -Observe for the next couple weeks.  If not improving over the next couple weeks consider referral to sports medicine for further evaluation  Eulas Post MD [facility] at [facility]

## 2019-09-02 NOTE — Patient Instructions (Signed)
Muscle Strain  A muscle strain is an injury that occurs when a muscle is stretched beyond its normal length. Usually, a small number of muscle fibers are torn when this happens. There are three types of muscle strains. First-degree strains have the least amount of muscle fiber tearing and the least amount of pain. Second-degree and third-degree strains have more tearing and pain.  Usually, recovery from muscle strain takes 1-2 weeks. Complete healing normally takes 5-6 weeks.  What are the causes?  This condition is caused when a sudden, violent force is placed on a muscle and stretches it too far. This may occur with a fall, lifting, or sports.  What increases the risk?  This condition is more likely to develop in athletes and people who are physically active.  What are the signs or symptoms?  Symptoms of this condition include:  · Pain.  · Bruising.  · Swelling.  · Trouble using the muscle.  How is this diagnosed?  This condition is diagnosed based on a physical exam and your medical history. Tests may also be done, including an X-ray, ultrasound, or MRI.  How is this treated?  This condition is initially treated with PRICE therapy. This therapy involves:  · Protecting the muscle from being injured again.  · Resting the injured muscle.  · Icing the injured muscle.  · Applying pressure (compression) to the injured muscle. This may be done with a splint or elastic bandage.  · Raising (elevating) the injured muscle.  Your health care provider may also recommend medicine for pain.  Follow these instructions at home:  If you have a splint:  · Wear the splint as told by your health care provider. Remove it only as told by your health care provider.  · Loosen the splint if your fingers or toes tingle, become numb, or turn cold and blue.  · Keep the splint clean.  · If the splint is not waterproof:  ? Do not let it get wet.  ? Cover it with a watertight covering when you take a bath or a shower.  Managing pain, stiffness,  and swelling    · If directed, put ice on the injured area.  ? If you have a removable splint, remove it as told by your health care provider.  ? Put ice in a plastic bag.  ? Place a towel between your skin and the bag.  ? Leave the ice on for 20 minutes, 2-3 times a day.  · Move your fingers or toes often to avoid stiffness and to lessen swelling.  · Raise (elevate) the injured area above the level of your heart while you are sitting or lying down.  · Wear an elastic bandage as told by your health care provider. Make sure that it is not too tight.  General instructions  · Take over-the-counter and prescription medicines only as told by your health care provider.  · Restrict your activity and rest the injured muscle as told by your health care provider. Gentle movements may be allowed.  · If physical therapy was prescribed, do exercises as told by your health care provider.  · Do not put pressure on any part of the splint until it is fully hardened. This may take several hours.  · Do not use any products that contain nicotine or tobacco, such as cigarettes and e-cigarettes. These can delay bone healing. If you need help quitting, ask your health care provider.  · Ask your health care provider when it   You have more pain or swelling in the injured area. Get help right away if:  You have numbness or tingling or lose a lot of strength in the injured area. Summary  A muscle strain is an injury that occurs when a muscle is stretched beyond its normal length.  This condition is caused when a sudden, violent force is placed on a muscle and stretches it too far.  This condition is initially treated with PRICE therapy, which involves protecting, resting,  icing, compressing, and elevating.  Gentle movements may be allowed. If physical therapy was prescribed, do exercises as told by your health care provider. This information is not intended to replace advice given to you by your health care provider. Make sure you discuss any questions you have with your health care provider. Document Revised: 06/26/2017 Document Reviewed: 08/20/2016 Elsevier Patient Education  2020 Elsevier Inc.  

## 2019-09-03 ENCOUNTER — Ambulatory Visit: Payer: PPO

## 2019-09-03 ENCOUNTER — Ambulatory Visit: Payer: PPO | Attending: Internal Medicine

## 2019-09-03 DIAGNOSIS — Z23 Encounter for immunization: Secondary | ICD-10-CM

## 2019-09-03 NOTE — Progress Notes (Signed)
   Covid-19 Vaccination Clinic  Name:  Jermaine Brady    MRN: [id number] DOB: 1951-01-28  09/03/2019  [name] was observed post Covid-19 immunization for 15 minutes without incidence. He was provided with Vaccine Information Sheet and instruction to access the V-Safe system.   [name] was instructed to call 911 with any severe reactions post vaccine: Marland Kitchen Difficulty breathing  . Swelling of your face and throat  . A fast heartbeat  . A bad rash all over your body  . Dizziness and weakness    Immunizations Administered    Name Date Dose VIS Date Route   Pfizer COVID-19 Vaccine 09/03/2019 11:15 AM 0.3 mL 07/08/2019 Intramuscular   Manufacturer: [place]   Lot: [id number]   [place]: [id number]

## 2019-09-07 ENCOUNTER — Other Ambulatory Visit: Payer: Self-pay | Admitting: Family Medicine

## 2019-09-13 ENCOUNTER — Ambulatory Visit: Payer: PPO

## 2019-09-13 ENCOUNTER — Encounter: Payer: Self-pay | Admitting: Family Medicine

## 2019-09-14 ENCOUNTER — Encounter: Payer: Self-pay | Admitting: Orthopaedic Surgery

## 2019-09-14 ENCOUNTER — Other Ambulatory Visit: Payer: Self-pay

## 2019-09-14 ENCOUNTER — Ambulatory Visit (INDEPENDENT_AMBULATORY_CARE_PROVIDER_SITE_OTHER): Payer: PPO

## 2019-09-14 ENCOUNTER — Ambulatory Visit (INDEPENDENT_AMBULATORY_CARE_PROVIDER_SITE_OTHER): Payer: PPO | Admitting: Orthopaedic Surgery

## 2019-09-14 VITALS — Ht 72.0 in | Wt 190.0 lb

## 2019-09-14 DIAGNOSIS — M25552 Pain in left hip: Secondary | ICD-10-CM | POA: Diagnosis not present

## 2019-09-14 DIAGNOSIS — S76012A Strain of muscle, fascia and tendon of left hip, initial encounter: Secondary | ICD-10-CM

## 2019-09-14 MED ORDER — MELOXICAM 7.5 MG PO TABS: 7.5000 mg | ORAL_TABLET | Freq: Every day | ORAL | 2 refills | Status: AC

## 2019-09-14 NOTE — Progress Notes (Signed)
Office Visit Note   Patient: Jermaine Brady           Date of Birth: 1951-01-18           MRN: [id number] Visit Date: 09/14/2019              Requested by: Eulas Post, MD [place],  [place] [id number] PCP: Eulas Post, MD   Assessment & Plan: Visit Diagnoses:  1. Pain in left hip     Plan: Impression is left hip abductor tendinitis and IT band syndrome.  We will start the patient on Mobic daily as well as physical therapy.  Internal referral has been made.  He will follow-up with [place] if he does not notice any improvement over the next several weeks.  Follow-Up Instructions: Return if symptoms worsen or fail to improve.   Orders:  Orders Placed This Encounter  Procedures  . XR HIP UNILAT W OR W/O PELVIS 2-3 VIEWS LEFT  . Ambulatory referral to Physical Therapy   Meds ordered this encounter  Medications  . meloxicam (MOBIC) 7.5 MG tablet    Sig: Take 1 tablet (7.5 mg total) by mouth daily for 14 doses.    Dispense:  30 tablet    Refill:  2      Procedures: No procedures performed   Clinical Data: No additional findings.   Subjective: Chief Complaint  Patient presents with  . Left Hip - Pain    HPI patient is a pleasant 69 year old gentleman who comes in today with left lateral hip and buttocks pain.  This has been ongoing for the past 2-1/2 weeks.  No specific aggravators or change in activity.  His pain is not worsened but it has not improved.  He describes this as a constant ache.  His pain is aggravated with increased activity to include going up and down stairs as well as when he is getting in and out of the car.  No weakness, numbness, tingling or burning to the left lower extremity.  He has been taking ibuprofen without significant relief of symptoms.  Review of Systems as detailed in HPI.  All others reviewed and are negative.   Objective: Vital Signs: Ht 6' (1.829 m)   Wt 190 lb (86.2 kg)   BMI 25.77 kg/m   Physical  Exam well-developed well-nourished gentleman in no acute distress.  Alert and oriented x3.  Ortho Exam examination of the left hip reveals negative logroll and negative FADIR.  Negative straight leg raise.  No tenderness to the greater trochanter.  He does have slight tenderness to the abductor tendons.  No pain with lumbar flexion or extension.  Does have slight pain with external rotation of the hip.  No focal weakness.  He is neurovascular intact distally.  Specialty Comments:  No specialty comments available.  Imaging: XR HIP UNILAT W OR W/O PELVIS 2-3 VIEWS LEFT  Result Date: 09/14/2019 No acute or structural abnormalities    PMFS History: Patient Active Problem List   Diagnosis Date Noted  . BPH (benign prostatic hyperplasia) 11/10/2018  . Chest pain 05/19/2016  . Hepatitis C 11/06/2010  . CELLULITIS, HAND, LEFT 01/07/2010  . Essential hypertension 10/23/2008  . ALLERGIC RHINITIS 10/23/2008  . DIVERTICULOSIS, COLON 10/23/2008  . COLONIC POLYPS, HX OF 10/23/2008   Past Medical History:  Diagnosis Date  . ALLERGIC RHINITIS 10/23/2008  . Arthritis    "neck" (05/19/2016)  . DIVERTICULOSIS, COLON 10/23/2008  . Heart murmur   .  Hepatitis C    "don't know when I contracted this; went thru treatment in ~ 2007-2008" ="cured per pt"  . History of blood transfusion 2008?   "during tx to cure my Hep C"  . History of kidney stones   . HYPERTENSION 10/23/2008    Family History  Problem Relation Age of Onset  . Alcohol abuse Other   . Heart disease Other   . Alcohol abuse Father   . Colon cancer Neg Hx   . Colon polyps Neg Hx   . Esophageal cancer Neg Hx   . Rectal cancer Neg Hx   . Stomach cancer Neg Hx     Past Surgical History:  Procedure Laterality Date  . ABDOMINAL HERNIA REPAIR  2005  . ASD REPAIR  1971  . ASD REPAIR  age 66  . CARDIAC CATHETERIZATION  1960s - 1970s X 2   "at [facility]"  . COLON SURGERY  2007   ,resection, diverticular rupture  . COLONOSCOPY   06/12/2015  . HERNIA REPAIR  2005  . POLYPECTOMY    . SHOULDER ARTHROSCOPY W/ ROTATOR CUFF REPAIR Right 2014   Social History   Occupational History  . Not on file  Tobacco Use  . Smoking status: Never Smoker  . Smokeless tobacco: Never Used  Substance and Sexual Activity  . Alcohol use: Yes    Alcohol/week: 2.0 standard drinks    Types: 2 Cans of beer per week  . Drug use: No  . Sexual activity: Yes

## 2019-09-20 ENCOUNTER — Telehealth: Payer: Self-pay | Admitting: *Deleted

## 2019-09-20 DIAGNOSIS — H401121 Primary open-angle glaucoma, left eye, mild stage: Secondary | ICD-10-CM | POA: Diagnosis not present

## 2019-09-20 DIAGNOSIS — H401112 Primary open-angle glaucoma, right eye, moderate stage: Secondary | ICD-10-CM | POA: Diagnosis not present

## 2019-09-20 DIAGNOSIS — H2513 Age-related nuclear cataract, bilateral: Secondary | ICD-10-CM | POA: Diagnosis not present

## 2019-09-20 NOTE — Telephone Encounter (Signed)
   [facility] Pre-operative Risk Assessment    Request for surgical clearance:  1. What type of surgery is being performed? CE IOL w/KAHOOK GANIOTOMY   2. When is this surgery scheduled? TBD   3. What type of clearance is required (medical clearance vs. Pharmacy clearance to hold med vs. Both)? MEDICAL  4. Are there any medications that need to be held prior to surgery and how long? ASA    5. Practice name and name of physician performing surgery? [facility]; DR. GROAT/COHEN   6. What is your office phone number [phone]    7.   What is your office fax number [phone]  8.   Anesthesia type (None, local, MAC, general) ? MAC   Jermaine Brady 09/20/2019, 4:30 PM  _________________________________________________________________   (provider comments below)

## 2019-09-20 NOTE — Telephone Encounter (Signed)
   Primary Cardiologist:Henry Nicholes Stairs III, MD  Chart reviewed as part of pre-operative protocol coverage. Because of Jermaine Brady past medical history and time since last visit, he/she will require a follow-up visit in order to better assess preoperative cardiovascular risk.  Pre-op covering staff: - Please schedule appointment and call patient to inform them. - Please contact requesting surgeon's office via preferred method (i.e, phone, fax) to inform them of need for appointment prior to surgery.  If applicable, this message will also be routed to pharmacy pool and/or primary cardiologist for input on holding anticoagulant/antiplatelet agent as requested below so that this information is available at time of patient's appointment.   Cecilie Kicks, NP  09/20/2019, 5:27 PM

## 2019-09-21 NOTE — Telephone Encounter (Signed)
Call placed to pt re: surgical clearance request.  Left pt a detailed message that he requires an appt before clearance can be granted. I also left detailed message that Dr. Tamala Julian has an opening for this Friday, 09/23/2019 at 9:40 and I would go ahead and schedule him and if this wouldn't work, to call our office back.  Will route to requesting surgeon's office to make them aware as well.

## 2019-09-22 NOTE — Progress Notes (Signed)
Cardiology Office Note:    Date:  09/23/2019   ID:  EWELL DEFUSCO, DOB 1950-10-31, MRN [id number]  PCP:  Eulas Post, MD  Cardiologist:  Sinclair Grooms, MD   Referring MD: Eulas Post, MD   Chief Complaint  Patient presents with  . Pre-op Exam    Ophthalmologic surgery    History of Present Illness:    Jermaine Brady is a 69 y.o. male with a hx of HTN,ASD s/p repair at age 84 (with subsequent lapse in cardiology follow up), BPH and hepatitis C. Recent chest and arm pain.  He is here to be cleared for upcoming surgery.  He is doing well.  He has no symptoms.  He has inherently normal cholesterol.  He is physically active.  He has not had syncope, palpitations, edema, orthopnea, PND, or other complaints.  Past Medical History:  Diagnosis Date  . ALLERGIC RHINITIS 10/23/2008  . Arthritis    "neck" (05/19/2016)  . DIVERTICULOSIS, COLON 10/23/2008  . Heart murmur   . Hepatitis C    "don't know when I contracted this; went thru treatment in ~ 2007-2008" ="cured per pt"  . History of blood transfusion 2008?   "during tx to cure my Hep C"  . History of kidney stones   . HYPERTENSION 10/23/2008    Past Surgical History:  Procedure Laterality Date  . ABDOMINAL HERNIA REPAIR  2005  . ASD REPAIR  1971  . ASD REPAIR  age 27  . CARDIAC CATHETERIZATION  1960s - 1970s X 2   "at [facility]"  . COLON SURGERY  2007   ,resection, diverticular rupture  . COLONOSCOPY  06/12/2015  . HERNIA REPAIR  2005  . POLYPECTOMY    . SHOULDER ARTHROSCOPY W/ ROTATOR CUFF REPAIR Right 2014    Current Medications: Current Meds  Medication Sig  . aspirin EC 81 MG tablet Take 81 mg by mouth at bedtime.  Marland Kitchen azelastine (ASTELIN) 0.1 % nasal spray Place 2 sprays into both nostrils 2 (two) times daily. Use in each nostril as directed  . Cholecalciferol (VITAMIN D3) 1000 UNITS CAPS Take 1,000 Units by mouth at bedtime.   . fexofenadine (ALLEGRA) 180 MG tablet Take 180 mg by mouth daily as  needed for allergies.   Marland Kitchen lisinopril-hydrochlorothiazide (ZESTORETIC) 20-12.5 MG tablet TAKE 1 TABLET BY MOUTH EVERY DAY (NEED APPOINTMENT)  . meloxicam (MOBIC) 7.5 MG tablet Take 1 tablet (7.5 mg total) by mouth daily for 14 doses.  . naproxen sodium (ALEVE) 220 MG tablet Take 440 mg by mouth 2 (two) times daily as needed (pain/headache).  . sildenafil (REVATIO) 20 MG tablet Take 40 mg by mouth daily as needed (erectile dysfunction).  . Tamsulosin HCl (FLOMAX) 0.4 MG CAPS Take 0.4 mg by mouth at bedtime.      Allergies:   Patient has no known allergies.   Social History   Socioeconomic History  . Marital status: Married    Spouse name: Not on file  . Number of children: Not on file  . Years of education: Not on file  . Highest education level: Not on file  Occupational History  . Not on file  Tobacco Use  . Smoking status: Never Smoker  . Smokeless tobacco: Never Used  Substance and Sexual Activity  . Alcohol use: Yes    Alcohol/week: 2.0 standard drinks    Types: 2 Cans of beer per week  . Drug use: No  . Sexual activity: Yes  Other Topics Concern  .  Not on file  Social History Narrative  . Not on file   Social Determinants of Health   Financial Resource Strain:   . Difficulty of Paying Living Expenses: Not on file  Food Insecurity:   . Worried About Charity fundraiser in the Last Year: Not on file  . Ran Out of Food in the Last Year: Not on file  Transportation Needs:   . Lack of Transportation (Medical): Not on file  . Lack of Transportation (Non-Medical): Not on file  Physical Activity:   . Days of Exercise per Week: Not on file  . Minutes of Exercise per Session: Not on file  Stress:   . Feeling of Stress : Not on file  Social Connections:   . Frequency of Communication with Friends and Family: Not on file  . Frequency of Social Gatherings with Friends and Family: Not on file  . Attends Religious Services: Not on file  . Active Member of Clubs or  Organizations: Not on file  . Attends Archivist Meetings: Not on file  . Marital Status: Not on file     Family History: The patient's family history includes Alcohol abuse in his father and another family member; Heart disease in an other family member. There is no history of Colon cancer, Colon polyps, Esophageal cancer, Rectal cancer, or Stomach cancer.  ROS:   Please see the history of present illness.    Cataract surgery is coming up with Dr. Katy Fitch.  He has not had lower extremity swelling.  He denies no episodes of syncope.  No palpitations.  All other systems reviewed and are negative.  EKGs/Labs/Other Studies Reviewed:    The following studies were reviewed today: No new functional or imaging data.  EKG:  EKG sinus rhythm, PVC, otherwise normal, and when compared to 2019, essentially unchanged with the exception of the PVC noted on this tracing.  Recent Labs: No results found for requested labs within last 8760 hours.  Recent Lipid Panel    Component Value Date/Time   CHOL 128 05/13/2017 0803   TRIG 64 05/13/2017 0803   HDL 29 (L) 05/13/2017 0803   CHOLHDL 4.4 05/13/2017 0803   CHOLHDL 4 12/15/2014 0933   VLDL 12.8 12/15/2014 0933   LDLCALC 86 05/13/2017 0803    Physical Exam:    VS:  BP 140/76   Pulse 77   Ht 6' (1.829 m)   Wt 192 lb 12.8 oz (87.5 kg)   SpO2 97%   BMI 26.15 kg/m     Wt Readings from Last 3 Encounters:  09/23/19 192 lb 12.8 oz (87.5 kg)  09/14/19 190 lb (86.2 kg)  09/02/19 194 lb 3.2 oz (88.1 kg)     GEN: Slender and appears compatible with his stated age.. No acute distress HEENT: Normal NECK: No JVD. LYMPHATICS: No lymphadenopathy CARDIAC:  RRR without murmur, gallop, or edema. VASCULAR:  Normal Pulses. No bruits. RESPIRATORY:  Clear to auscultation without rales, wheezing or rhonchi  ABDOMEN: Soft, non-tender, non-distended, No pulsatile mass, MUSCULOSKELETAL: No deformity  SKIN: Warm and dry NEUROLOGIC:  Alert and  oriented x 3 PSYCHIATRIC:  Normal affect   ASSESSMENT:    1. Preop cardiovascular exam   2. Essential hypertension   3. H/O atrial septal defect repair   4. Educated about COVID-19 virus infection    PLAN:    In order of problems listed above:  1. Low risk for upcoming surgery.  Okay to hold aspirin as long as needed  relative to cataract surgery. 2. Blood pressure is well controlled. 3. No clinical evidence of structural cardiac abnormality, murmur, or other potentially related conditions such as atrial fibrillation or arrhythmia. 4. He has gotten COVID-19 vaccination part 1 and in 2 weeks will get the second dose.  He is practicing social distancing and masking.  Overall education and awareness concerning primary/secondary risk prevention was discussed in detail: LDL less than 70, hemoglobin A1c less than 7, blood pressure target less than 130/80 mmHg, >150 minutes of moderate aerobic activity per week, avoidance of smoking, weight control (via diet and exercise), and continued surveillance/management of/for obstructive sleep apnea.     Medication Adjustments/Labs and Tests Ordered: Current medicines are reviewed at length with the patient today.  Concerns regarding medicines are outlined above.  Orders Placed This Encounter  Procedures  . EKG 12-Lead   No orders of the defined types were placed in this encounter.   Patient Instructions  Medication Instructions:  Your physician recommends that you continue on your current medications as directed. Please refer to the Current Medication list given to you today.  *If you need a refill on your cardiac medications before your next appointment, please call your pharmacy*   Lab Work: None If you have labs (blood work) drawn today and your tests are completely normal, you will receive your results only by: Marland Kitchen MyChart Message (if you have MyChart) OR . A paper copy in the mail If you have any lab test that is abnormal or we need  to change your treatment, we will call you to review the results.   Testing/Procedures: None   Follow-Up: At [facility] you and your health needs are our priority.  As part of our continuing mission to provide you with exceptional heart care, we have created designated Provider Care Teams.  These Care Teams include your primary Cardiologist (physician) and Advanced Practice Providers (APPs -  Physician Assistants and Nurse Practitioners) who all work together to provide you with the care you need, when you need it.  We recommend signing up for the patient portal called "MyChart".  Sign up information is provided on this After Visit Summary.  MyChart is used to connect with patients for Virtual Visits (Telemedicine).  Patients are able to view lab/test results, encounter notes, upcoming appointments, etc.  Non-urgent messages can be sent to your provider as well.   To learn more about what you can do with MyChart, go to [email or web address].    Your next appointment:   2 year(s)  The format for your next appointment:   In Person  Provider:   You may see Sinclair Grooms, MD or one of the following Advanced Practice Providers on your designated Care Team:    Truitt Merle, NP  Cecilie Kicks, NP  Kathyrn Drown, NP    Other Instructions      Signed, Sinclair Grooms, MD  09/23/2019 10:05 AM    [place]

## 2019-09-22 NOTE — Telephone Encounter (Signed)
I will send clearance notes to Dr.Smith for upcoming appt 09/23/19. I will remove from the pre op call back pool.

## 2019-09-23 ENCOUNTER — Other Ambulatory Visit: Payer: Self-pay

## 2019-09-23 ENCOUNTER — Ambulatory Visit: Payer: PPO | Admitting: Interventional Cardiology

## 2019-09-23 ENCOUNTER — Encounter: Payer: Self-pay | Admitting: Interventional Cardiology

## 2019-09-23 VITALS — BP 140/76 | HR 77 | Ht 72.0 in | Wt 192.8 lb

## 2019-09-23 DIAGNOSIS — Z7189 Other specified counseling: Secondary | ICD-10-CM | POA: Diagnosis not present

## 2019-09-23 DIAGNOSIS — I10 Essential (primary) hypertension: Secondary | ICD-10-CM

## 2019-09-23 DIAGNOSIS — Z01810 Encounter for preprocedural cardiovascular examination: Secondary | ICD-10-CM | POA: Diagnosis not present

## 2019-09-23 DIAGNOSIS — Z8774 Personal history of (corrected) congenital malformations of heart and circulatory system: Secondary | ICD-10-CM | POA: Diagnosis not present

## 2019-09-23 NOTE — Patient Instructions (Signed)
Medication Instructions:  Your physician recommends that you continue on your current medications as directed. Please refer to the Current Medication list given to you today.  *If you need a refill on your cardiac medications before your next appointment, please call your pharmacy*   Lab Work: None If you have labs (blood work) drawn today and your tests are completely normal, you will receive your results only by: Marland Kitchen MyChart Message (if you have MyChart) OR . A paper copy in the mail If you have any lab test that is abnormal or we need to change your treatment, we will call you to review the results.   Testing/Procedures: None   Follow-Up: At [facility], you and your health needs are our priority.  As part of our continuing mission to provide you with exceptional heart care, we have created designated Provider Care Teams.  These Care Teams include your primary Cardiologist (physician) and Advanced Practice Providers (APPs -  Physician Assistants and Nurse Practitioners) who all work together to provide you with the care you need, when you need it.  We recommend signing up for the patient portal called "MyChart".  Sign up information is provided on this After Visit Summary.  MyChart is used to connect with patients for Virtual Visits (Telemedicine).  Patients are able to view lab/test results, encounter notes, upcoming appointments, etc.  Non-urgent messages can be sent to your provider as well.   To learn more about what you can do with MyChart, go to [email or web address].    Your next appointment:   2 year(s)  The format for your next appointment:   In Person  Provider:   You may see Sinclair Grooms, MD or one of the following Advanced Practice Providers on your designated Care Team:    Truitt Merle, NP  Cecilie Kicks, NP  Kathyrn Drown, NP    Other Instructions

## 2019-09-25 NOTE — Telephone Encounter (Signed)
Cleared for upcoming cataract surgery by Dr. Katy Fitch. Okay to hold aspirin as needed prior to procedure.

## 2019-09-26 NOTE — Telephone Encounter (Signed)
Patient was cleared by Dr. Tamala Julian recently in the clinic. I have forwarded the clinic note to Dr. Katy Fitch and Dr. Manson Allan office

## 2019-09-28 ENCOUNTER — Ambulatory Visit: Payer: PPO | Attending: Internal Medicine

## 2019-09-28 DIAGNOSIS — Z23 Encounter for immunization: Secondary | ICD-10-CM

## 2019-09-28 NOTE — Progress Notes (Signed)
   Covid-19 Vaccination Clinic  Name:  BERLEY TIBBETTS    MRN: [id number] DOB: [birth year]  09/28/2019  Mr. Radakovich was observed post Covid-19 immunization for 15 minutes without incident. He was provided with Vaccine Information Sheet and instruction to access the V-Safe system.   Mr. Clowe was instructed to call 911 with any severe reactions post vaccine: Marland Kitchen Difficulty breathing  . Swelling of face and throat  . A fast heartbeat  . A bad rash all over body  . Dizziness and weakness   Immunizations Administered    Name Date Dose VIS Date Route   Pfizer COVID-19 Vaccine 09/28/2019  8:35 AM 0.3 mL 07/08/2019 Intramuscular   Manufacturer: [place]   Lot: [id number]   [place]: [id number]

## 2019-10-10 ENCOUNTER — Other Ambulatory Visit: Payer: Self-pay

## 2019-10-10 ENCOUNTER — Encounter: Payer: Self-pay | Admitting: Physical Therapy

## 2019-10-10 ENCOUNTER — Ambulatory Visit: Payer: PPO | Attending: Physician Assistant | Admitting: Physical Therapy

## 2019-10-10 DIAGNOSIS — R262 Difficulty in walking, not elsewhere classified: Secondary | ICD-10-CM | POA: Diagnosis not present

## 2019-10-10 DIAGNOSIS — M25552 Pain in left hip: Secondary | ICD-10-CM

## 2019-10-10 NOTE — Therapy (Signed)
[place], [place], [address] Phone: [phone]   Fax:  [phone]  Physical Therapy Evaluation  Patient Details  Name: Jermaine Brady MRN: [id number] Date of Birth: [birth year] Referring Provider (PT): Paul Half PA   Encounter Date: 10/10/2019  PT End of Session - 10/10/19 0827    Visit Number  1    Date for PT Re-Evaluation  12/10/19    Authorization Type  HTA Medicare    PT Start Time  0745    PT Stop Time  0820    PT Time Calculation (min)  35 min    Activity Tolerance  Patient tolerated treatment well    Behavior During Therapy  [facility] for tasks assessed/performed       Past Medical History:  Diagnosis Date  . ALLERGIC RHINITIS 10/23/2008  . Arthritis    "neck" (05/19/2016)  . DIVERTICULOSIS, COLON 10/23/2008  . Heart murmur   . Hepatitis C    "don't know when I contracted this; went thru treatment in ~ 2007-2008" ="cured per pt"  . History of blood transfusion 2008?   "during tx to cure my Hep C"  . History of kidney stones   . HYPERTENSION 10/23/2008    Past Surgical History:  Procedure Laterality Date  . ABDOMINAL HERNIA REPAIR  2005  . ASD REPAIR  1971  . ASD REPAIR  age 73  . CARDIAC CATHETERIZATION  1960s - 1970s X 2   "at [facility]"  . COLON SURGERY  2007   ,resection, diverticular rupture  . COLONOSCOPY  06/12/2015  . HERNIA REPAIR  2005  . POLYPECTOMY    . SHOULDER ARTHROSCOPY W/ ROTATOR CUFF REPAIR Right 2014    There were no vitals filed for this visit.   Subjective Assessment - 10/10/19 0754    Subjective  Patient reports that he has had left hip pain, he is unsure of a cause.  Pain started about 6 weeks ago.  X-rays were negative, "doctor thought it was more muscular or tendonitis"    Limitations  Lifting;Walking    Patient Stated Goals  have less pain    Currently in Pain?  Yes    Pain Score  1     Pain Location  Hip    Pain Orientation  Left    Pain  Descriptors / Indicators  Aching;Sharp    Pain Type  Acute pain    Pain Radiating Towards  some pain from the GT area to the mid lateral thigh    Pain Frequency  Constant    Aggravating Factors   stairs, walking , getting in and out of the truck pain up to 4/10    Pain Relieving Factors  ibuprofen, not doing anything pain can be a 1/10    Effect of Pain on Daily Activities  difficulty stairs, walking, in and out of the truck         [facility] PT Assessment - 10/10/19 0001      Assessment   Medical Diagnosis  left hip pain    Referring Provider (PT)  Paul Half PA    Onset Date/Surgical Date  08/29/19    Prior Therapy  no      Precautions   Precautions  None      Balance Screen   Has the patient fallen in the past 6 months  No    Has the patient had a decrease in activity level because of a fear of  falling?   No    Is the patient reluctant to leave their home because of a fear of falling?   No      Home Environment   Additional Comments  stairs, does the yardwork      Prior Function   Level of Independence  Independent    Vocation  Full time employment    Vocation Requirements  climbing up into higher vehicles, walking on uneven ground      Posture/Postural Control   Posture Comments  slouched sitting      ROM / Strength   AROM / PROM / Strength  AROM;Strength      Strength   Overall Strength Comments  4+/5 with some mild pain in the left lateral hip with MMT                Objective measurements completed on examination: See above findings.                PT Short Term Goals - 10/10/19 0831      PT SHORT TERM GOAL #1   Title  independent iwth initial HEP    Time  2    Period  Weeks    Status  New        PT Long Term Goals - 10/10/19 [id number]      PT LONG TERM GOAL #1   Title  understand posture and body mechanics    Time  8    Period  Weeks    Status  New      PT LONG TERM GOAL #2   Title  decrease pain overall 50%    Time  8     Period  Weeks    Status  New      PT LONG TERM GOAL #3   Title  report no pain getting in and out of the truck    Time  8    Period  Weeks    Status  New      PT LONG TERM GOAL #4   Title  go up stairs without difficulty    Time  8    Period  Weeks    Status  New             Plan - 10/10/19 [id number]    Clinical Impression Statement  Patient reports left hip pain starting about 6 weeks ago, he is unsure of a cause.  X-rays are negative.  "MD reports muscle or tendon issue"  He reports pain mostly on stairs and walking.  HE is tender in the left posterior GT area.  He is very tight in piriformis, mild tightness HS, tight in the ITB.    Clinical Decision Making  Low    Rehab Potential  Good    PT Frequency  2x / week    PT Duration  8 weeks    PT Treatment/Interventions  ADLs/Self Care Home Management;Electrical Stimulation;Iontophoresis 4mg /ml Dexamethasone;Cryotherapy;Moist Heat;Ultrasound;Stair training;Gait training;Therapeutic activities;Therapeutic exercise;Balance training;Neuromuscular re-education;Wheelchair mobility training;Patient/family education;Dry needling    PT Next Visit Plan  see how the stretches and the patch did, advance stretching    Consulted and Agree with Plan of Care  Patient       Patient will benefit from skilled therapeutic intervention in order to improve the following deficits and impairments:  Pain, Postural dysfunction, Increased muscle spasms, Decreased strength, Decreased range of motion, Impaired flexibility, Difficulty walking  Visit Diagnosis: Pain in left hip - Plan: PT plan of  care cert/re-cert  Difficulty in walking, not elsewhere classified - Plan: PT plan of care cert/re-cert     Problem List Patient Active Problem List   Diagnosis Date Noted  . BPH (benign prostatic hyperplasia) 11/10/2018  . Chest pain 05/19/2016  . Hepatitis C 11/06/2010  . CELLULITIS, HAND, LEFT 01/07/2010  . Essential hypertension 10/23/2008  . ALLERGIC  RHINITIS 10/23/2008  . DIVERTICULOSIS, COLON 10/23/2008  . COLONIC POLYPS, HX OF 10/23/2008    Sumner Boast., PT 10/10/2019, 8:34 AM  [place] [address] [place], [place], [address] Phone: [phone]   Fax:  [phone]  Name: Jermaine Brady MRN: [id number] Date of Birth: 1951/06/28

## 2019-10-10 NOTE — Patient Instructions (Signed)
Access Code: [email or web address] URL: [email or web address] Date: 10/10/2019 Prepared by: Lum Babe  Exercises Hooklying Hamstring Stretch with Strap - 1 x daily - 7 x weekly - 10 reps - 3 sets - 30 hold Seated Hamstring Stretch with Chair - 1 x daily - 7 x weekly - 10 reps - 3 sets - 30 hold Supine Piriformis Stretch Pulling Heel to Hip - 1 x daily - 7 x weekly - 10 reps - 3 sets - 30 hold Supine ITB Stretch with Strap - 1 x daily - 7 x weekly - 10 reps - 3 sets - 30 hold

## 2019-10-13 DIAGNOSIS — H2511 Age-related nuclear cataract, right eye: Secondary | ICD-10-CM | POA: Diagnosis not present

## 2019-10-13 DIAGNOSIS — H401110 Primary open-angle glaucoma, right eye, stage unspecified: Secondary | ICD-10-CM | POA: Diagnosis not present

## 2019-10-13 DIAGNOSIS — H401112 Primary open-angle glaucoma, right eye, moderate stage: Secondary | ICD-10-CM | POA: Diagnosis not present

## 2019-10-18 ENCOUNTER — Ambulatory Visit: Payer: PPO | Admitting: Physical Therapy

## 2019-10-20 ENCOUNTER — Encounter: Payer: Self-pay | Admitting: Physical Therapy

## 2019-10-20 ENCOUNTER — Ambulatory Visit: Payer: PPO | Admitting: Physical Therapy

## 2019-10-20 ENCOUNTER — Other Ambulatory Visit: Payer: Self-pay

## 2019-10-20 DIAGNOSIS — M25552 Pain in left hip: Secondary | ICD-10-CM | POA: Diagnosis not present

## 2019-10-20 DIAGNOSIS — R262 Difficulty in walking, not elsewhere classified: Secondary | ICD-10-CM

## 2019-10-20 NOTE — Therapy (Signed)
[place], [place], [address] Phone: [phone]   Fax:  [phone]  Physical Therapy Treatment  Patient Details  Name: Jermaine Brady MRN: [id number] Date of Birth: [birth year] Referring Provider (PT): Paul Half PA   Encounter Date: 10/20/2019  PT End of Session - 10/20/19 0926    Visit Number  2    Date for PT Re-Evaluation  12/10/19    Authorization Type  HTA Medicare    PT Start Time  0845    PT Stop Time  0927    PT Time Calculation (min)  42 min    Activity Tolerance  Patient tolerated treatment well    Behavior During Therapy  [facility] for tasks assessed/performed       Past Medical History:  Diagnosis Date  . ALLERGIC RHINITIS 10/23/2008  . Arthritis    "neck" (05/19/2016)  . DIVERTICULOSIS, COLON 10/23/2008  . Heart murmur   . Hepatitis C    "don't know when I contracted this; went thru treatment in ~ 2007-2008" ="cured per pt"  . History of blood transfusion 2008?   "during tx to cure my Hep C"  . History of kidney stones   . HYPERTENSION 10/23/2008    Past Surgical History:  Procedure Laterality Date  . ABDOMINAL HERNIA REPAIR  2005  . ASD REPAIR  1971  . ASD REPAIR  age 47  . CARDIAC CATHETERIZATION  1960s - 1970s X 2   "at [facility]"  . COLON SURGERY  2007   ,resection, diverticular rupture  . COLONOSCOPY  06/12/2015  . HERNIA REPAIR  2005  . POLYPECTOMY    . SHOULDER ARTHROSCOPY W/ ROTATOR CUFF REPAIR Right 2014    There were no vitals filed for this visit.  Subjective Assessment - 10/20/19 0845    Subjective  Pt reports that he is doing better but not sure why    Currently in Pain?  Yes    Pain Score  1     Pain Location  Hip    Pain Orientation  Left         [facility] PT Assessment - 10/20/19 0001      Assessment   Medical Diagnosis  left hip pain    Referring Provider (PT)  Paul Half PA    Onset Date/Surgical Date  08/29/19                    [facility] Adult PT Treatment/Exercise - 10/20/19 0001      Exercises   Exercises  Knee/Hip      Knee/Hip Exercises: Stretches   Passive Hamstring Stretch  Both;4 reps;10 seconds    ITB Stretch  Left;4 reps;10 seconds    Piriformis Stretch  Both;3 reps;20 seconds;10 seconds      Knee/Hip Exercises: Aerobic   Recumbent Bike  L0 x 3 min     Nustep  L3 x 5 min       Knee/Hip Exercises: Machines for Strengthening   Cybex Knee Flexion  20lb  2x10     Cybex Leg Press  30lb 2x10       Knee/Hip Exercises: Seated   Ball Squeeze  2x10 3 second hold     Abduction/Adduction   15 reps;Both;Strengthening;2 sets    Sit to Sand  without UE support;1 set;10 reps      Modalities   Modalities  Iontophoresis      Iontophoresis   Type of Iontophoresis  Dexamethasone  Location  L GT    Dose  42m    Time  4 hour patch               PT Short Term Goals - 10/20/19 0853      PT SHORT TERM GOAL #1   Title  independent iwth initial HEP    Status  Partially Met        PT Long Term Goals - 10/10/19 05631     PT LONG TERM GOAL #1   Title  understand posture and body mechanics    Time  8    Period  Weeks    Status  New      PT LONG TERM GOAL #2   Title  decrease pain overall 50%    Time  8    Period  Weeks    Status  New      PT LONG TERM GOAL #3   Title  report no pain getting in and out of the truck    Time  8    Period  Weeks    Status  New      PT LONG TERM GOAL #4   Title  go up stairs without difficulty    Time  8    Period  Weeks    Status  New            Plan - 10/20/19 04970   Clinical Impression Statement  Pt tolerated abn initial progression to TE well. He reported some improvement but did not know why. No pain reported. Cues needed to control the eccentric  phase on leg press and leg extensions. L tightness in L piriformis and ITB noted with passive stretching.    Rehab Potential  Good    PT Frequency  2x / week    PT  Duration  8 weeks    PT Treatment/Interventions  ADLs/Self Care Home Management;Electrical Stimulation;Iontophoresis 466mml Dexamethasone;Cryotherapy;Moist Heat;Ultrasound;Stair training;Gait training;Therapeutic activities;Therapeutic exercise;Balance training;Neuromuscular re-education;Wheelchair mobility training;Patient/family education;Dry needling    PT Next Visit Plan  advance stretching       Patient will benefit from skilled therapeutic intervention in order to improve the following deficits and impairments:  Pain, Postural dysfunction, Increased muscle spasms, Decreased strength, Decreased range of motion, Impaired flexibility, Difficulty walking  Visit Diagnosis: Pain in left hip  Difficulty in walking, not elsewhere classified     Problem List Patient Active Problem List   Diagnosis Date Noted  . BPH (benign prostatic hyperplasia) 11/10/2018  . Chest pain 05/19/2016  . Hepatitis C 11/06/2010  . CELLULITIS, HAND, LEFT 01/07/2010  . Essential hypertension 10/23/2008  . ALLERGIC RHINITIS 10/23/2008  . DIVERTICULOSIS, COLON 10/23/2008  . COLONIC POLYPS, HX OF 10/23/2008    RoScot JunPTA 10/20/2019, 9:29 AM  [place]: [phone] Fax:  [phone]: Jermaine KRUTZRN: [id number] of Birth: [birth year]

## 2019-10-25 ENCOUNTER — Other Ambulatory Visit: Payer: Self-pay

## 2019-10-25 ENCOUNTER — Ambulatory Visit: Payer: PPO | Admitting: Physical Therapy

## 2019-10-25 ENCOUNTER — Encounter: Payer: Self-pay | Admitting: Physical Therapy

## 2019-10-25 DIAGNOSIS — M25552 Pain in left hip: Secondary | ICD-10-CM | POA: Diagnosis not present

## 2019-10-25 DIAGNOSIS — R262 Difficulty in walking, not elsewhere classified: Secondary | ICD-10-CM

## 2019-10-25 NOTE — Therapy (Signed)
[place], [place], [address] Phone: [phone]   Fax:  [phone]  Physical Therapy Treatment  Patient Details  Name: Jermaine Brady MRN: [id number] Date of Birth: 1951-07-20 Referring Provider (PT): Paul Half PA   Encounter Date: 10/25/2019  PT End of Session - 10/25/19 0845    Visit Number  3    Date for PT Re-Evaluation  12/10/19    Authorization Type  HTA Medicare    PT Start Time  0800    PT Stop Time  0845    PT Time Calculation (min)  45 min    Activity Tolerance  Patient tolerated treatment well    Behavior During Therapy  [facility] for tasks assessed/performed       Past Medical History:  Diagnosis Date  . ALLERGIC RHINITIS 10/23/2008  . Arthritis    "neck" (05/19/2016)  . DIVERTICULOSIS, COLON 10/23/2008  . Heart murmur   . Hepatitis C    "don't know when I contracted this; went thru treatment in ~ 2007-2008" ="cured per pt"  . History of blood transfusion 2008?   "during tx to cure my Hep C"  . History of kidney stones   . HYPERTENSION 10/23/2008    Past Surgical History:  Procedure Laterality Date  . ABDOMINAL HERNIA REPAIR  2005  . ASD REPAIR  1971  . ASD REPAIR  age 69  . CARDIAC CATHETERIZATION  1960s - 1970s X 2   "at [facility]"  . COLON SURGERY  2007   ,resection, diverticular rupture  . COLONOSCOPY  06/12/2015  . HERNIA REPAIR  2005  . POLYPECTOMY    . SHOULDER ARTHROSCOPY W/ ROTATOR CUFF REPAIR Right 2014    There were no vitals filed for this visit.  Subjective Assessment - 10/25/19 0800    Subjective  "I don't know about the same, certainly no worst"    Limitations  Lifting;Walking    Currently in Pain?  No/denies                       [facility] Adult PT Treatment/Exercise - 10/25/19 0001      Knee/Hip Exercises: Stretches   Passive Hamstring Stretch  Both;4 reps;10 seconds    ITB Stretch  Left;5 reps;10 seconds    Piriformis Stretch   Right;5 reps;10 seconds;20 seconds    Other Knee/Hip Stretches  Single K2C 4x 15 sec each      Knee/Hip Exercises: Aerobic   Elliptical  I7 R 3 x 3 mim     Recumbent Bike  L0 x 5 min       Knee/Hip Exercises: Machines for Strengthening   Cybex Leg Press  40lb 3x10      Knee/Hip Exercises: Standing   Walking with Sports Cord  40lb 4 way x 3 each     Other Standing Knee Exercises  Hip abd & ext red 2x10       Knee/Hip Exercises: Supine   Bridges  Both;1 set;15 reps    Other Supine Knee/Hip Exercises  LE on [place] bridges, K2C, Oblq                PT Short Term Goals - 10/25/19 0851      PT SHORT TERM GOAL #1   Title  independent iwth initial HEP    Status  Achieved        PT Long Term Goals - 10/25/19 1324  PT LONG TERM GOAL #1   Title  understand posture and body mechanics    Status  Partially Met      PT LONG TERM GOAL #2   Title  decrease pain overall 50%    Status  On-going            Plan - 10/25/19 0847    Clinical Impression Statement  Pt reports feeling good after last session so sp progressed treatment with more intense interventions. Tactile cues to prevent leaning with standing hip interventions. Some instability with resisted side steps. Piriformis on L side remains tight. PT reports the episodes does not stop him form doing anything but it is just annoying.    Rehab Potential  Good    PT Frequency  2x / week    PT Duration  8 weeks    PT Next Visit Plan  advance stretching, asked pt to keep track of when episodes occur.       Patient will benefit from skilled therapeutic intervention in order to improve the following deficits and impairments:  Pain, Postural dysfunction, Increased muscle spasms, Decreased strength, Decreased range of motion, Impaired flexibility, Difficulty walking  Visit Diagnosis: Difficulty in walking, not elsewhere classified  Pain in left hip     Problem List Patient Active Problem List   Diagnosis Date  Noted  . BPH (benign prostatic hyperplasia) 11/10/2018  . Chest pain 05/19/2016  . Hepatitis C 11/06/2010  . CELLULITIS, HAND, LEFT 01/07/2010  . Essential hypertension 10/23/2008  . ALLERGIC RHINITIS 10/23/2008  . DIVERTICULOSIS, COLON 10/23/2008  . COLONIC POLYPS, HX OF 10/23/2008    Scot Jun, PTA 10/25/2019, 8:51 AM  [place] [address] [place], [place], [address] Phone: [phone]   Fax:  [phone]  Name: Jermaine Brady MRN: [id number] Date of Birth: 05-31-1951

## 2019-11-01 ENCOUNTER — Ambulatory Visit: Payer: PPO | Admitting: Physical Therapy

## 2019-11-08 ENCOUNTER — Ambulatory Visit: Payer: PPO | Attending: Physician Assistant | Admitting: Physical Therapy

## 2019-11-08 ENCOUNTER — Encounter: Payer: Self-pay | Admitting: Physical Therapy

## 2019-11-08 ENCOUNTER — Other Ambulatory Visit: Payer: Self-pay

## 2019-11-08 DIAGNOSIS — R262 Difficulty in walking, not elsewhere classified: Secondary | ICD-10-CM | POA: Insufficient documentation

## 2019-11-08 DIAGNOSIS — M25552 Pain in left hip: Secondary | ICD-10-CM | POA: Insufficient documentation

## 2019-11-08 NOTE — Therapy (Signed)
[place], [place], [address] Phone: [phone]   Fax:  [phone]  Physical Therapy Treatment  Patient Details  Name: Jermaine Brady MRN: [id number] Date of Birth: 1951-03-07 Referring Provider (PT): Paul Half PA   Encounter Date: 11/08/2019  PT End of Session - 11/08/19 0851    Visit Number  4    Date for PT Re-Evaluation  12/10/19    Authorization Type  HTA Medicare    PT Start Time  0759    PT Stop Time  0848    PT Time Calculation (min)  49 min    Activity Tolerance  Patient tolerated treatment well    Behavior During Therapy  [facility] for tasks assessed/performed       Past Medical History:  Diagnosis Date  . ALLERGIC RHINITIS 10/23/2008  . Arthritis    "neck" (05/19/2016)  . DIVERTICULOSIS, COLON 10/23/2008  . Heart murmur   . Hepatitis C    "don't know when I contracted this; went thru treatment in ~ 2007-2008" ="cured per pt"  . History of blood transfusion 2008?   "during tx to cure my Hep C"  . History of kidney stones   . HYPERTENSION 10/23/2008    Past Surgical History:  Procedure Laterality Date  . ABDOMINAL HERNIA REPAIR  2005  . ASD REPAIR  1971  . ASD REPAIR  age 50  . CARDIAC CATHETERIZATION  1960s - 1970s X 2   "at [facility]"  . COLON SURGERY  2007   ,resection, diverticular rupture  . COLONOSCOPY  06/12/2015  . HERNIA REPAIR  2005  . POLYPECTOMY    . SHOULDER ARTHROSCOPY W/ ROTATOR CUFF REPAIR Right 2014    There were no vitals filed for this visit.  Subjective Assessment - 11/08/19 0804    Subjective  Pt reports 2 occurences of L hip pain since last session.    Limitations  Lifting;Walking    Currently in Pain?  No/denies                       [facility] Adult PT Treatment/Exercise - 11/08/19 0001      Knee/Hip Exercises: Stretches   Passive Hamstring Stretch  Both;4 reps;10 seconds    ITB Stretch  Left;5 reps;10 seconds    Piriformis  Stretch  Right;5 reps;10 seconds;20 seconds    Other Knee/Hip Stretches  Single K2C 4x 15 sec each      Knee/Hip Exercises: Aerobic   Elliptical  I7 R 5 x 3 mim     Recumbent Bike  L0 x 3 min     Nustep  L3 x 5 min       Knee/Hip Exercises: Machines for Strengthening   Cybex Leg Press  40lb 3x10      Knee/Hip Exercises: Standing   Walking with Sports Cord  40lb 4 way x 3 each     Other Standing Knee Exercises  Hip abd & ext red 2x10       Modalities   Modalities  Iontophoresis      Iontophoresis   Type of Iontophoresis  Dexamethasone    Location  L GT    Dose  18m    Time  4 hour patch               PT Short Term Goals - 10/25/19 07253     PT SHORT TERM GOAL #1   Title  independent iwth  initial HEP    Status  Achieved        PT Long Term Goals - 11/08/19 8502      PT LONG TERM GOAL #1   Title  understand posture and body mechanics    Status  Achieved      PT LONG TERM GOAL #2   Title  decrease pain overall 50%    Status  Achieved      PT LONG TERM GOAL #3   Title  report no pain getting in and out of the truck    Status  Partially Met      PT LONG TERM GOAL #4   Title  go up stairs without difficulty    Status  Partially Met            Plan - 11/08/19 0852    Clinical Impression Statement  Pt reports improvement overall. Less occurences of pain and more pain free days. No issues with today's interventions. L hip tightness remains especially with external rotation. Cues needed to keep toes pointed straight wit resisted side steps.    Rehab Potential  Good    PT Frequency  2x / week    PT Duration  8 weeks    PT Treatment/Interventions  ADLs/Self Care Home Management;Electrical Stimulation;Iontophoresis 54m/ml Dexamethasone;Cryotherapy;Moist Heat;Ultrasound;Stair training;Gait training;Therapeutic activities;Therapeutic exercise;Balance training;Neuromuscular re-education;Wheelchair mobility training;Patient/family education;Dry needling    PT  Next Visit Plan  advance stretching,    PT Home Exercise Plan  Added hip ext and abduction       Patient will benefit from skilled therapeutic intervention in order to improve the following deficits and impairments:  Pain, Postural dysfunction, Increased muscle spasms, Decreased strength, Decreased range of motion, Impaired flexibility, Difficulty walking  Visit Diagnosis: Pain in left hip  Difficulty in walking, not elsewhere classified     Problem List Patient Active Problem List   Diagnosis Date Noted  . BPH (benign prostatic hyperplasia) 11/10/2018  . Chest pain 05/19/2016  . Hepatitis C 11/06/2010  . CELLULITIS, HAND, LEFT 01/07/2010  . Essential hypertension 10/23/2008  . ALLERGIC RHINITIS 10/23/2008  . DIVERTICULOSIS, COLON 10/23/2008  . COLONIC POLYPS, HX OF 10/23/2008    RScot Jun PTA 11/08/2019, 8:55 AM  [place] [address]Phone: [phone]  Fax:  [phone] Name: Jermaine MCKELLIPSMRN: [id number] of Birth: 810-17-1952

## 2019-11-15 ENCOUNTER — Other Ambulatory Visit: Payer: Self-pay

## 2019-11-15 ENCOUNTER — Encounter: Payer: Self-pay | Admitting: Physical Therapy

## 2019-11-15 ENCOUNTER — Ambulatory Visit: Payer: PPO | Admitting: Physical Therapy

## 2019-11-15 DIAGNOSIS — R262 Difficulty in walking, not elsewhere classified: Secondary | ICD-10-CM

## 2019-11-15 DIAGNOSIS — M25552 Pain in left hip: Secondary | ICD-10-CM | POA: Diagnosis not present

## 2019-11-15 NOTE — Therapy (Signed)
[place], [place], [address] Phone: [phone]   Fax:  [phone]  Physical Therapy Treatment  Patient Details  Name: Jermaine Brady MRN: [id number] Date of Birth: 15-May-1951 Referring Provider (PT): Paul Half PA   Encounter Date: 11/15/2019  PT End of Session - 11/15/19 1638    Visit Number  5    Date for PT Re-Evaluation  12/10/19    Authorization Type  [employer] Medicare    PT Start Time  1600    PT Stop Time  1640    PT Time Calculation (min)  40 min    Activity Tolerance  Patient tolerated treatment well    Behavior During Therapy  [facility] for tasks assessed/performed       Past Medical History:  Diagnosis Date  . ALLERGIC RHINITIS 10/23/2008  . Arthritis    "neck" (05/19/2016)  . DIVERTICULOSIS, COLON 10/23/2008  . Heart murmur   . Hepatitis C    "don't know when I contracted this; went thru treatment in ~ 2007-2008" ="cured per pt"  . History of blood transfusion 2008?   "during tx to cure my Hep C"  . History of kidney stones   . HYPERTENSION 10/23/2008    Past Surgical History:  Procedure Laterality Date  . ABDOMINAL HERNIA REPAIR  2005  . ASD REPAIR  1971  . ASD REPAIR  age 77  . CARDIAC CATHETERIZATION  1960s - 1970s X 2   "at [facility]"  . COLON SURGERY  2007   ,resection, diverticular rupture  . COLONOSCOPY  06/12/2015  . HERNIA REPAIR  2005  . POLYPECTOMY    . SHOULDER ARTHROSCOPY W/ ROTATOR CUFF REPAIR Right 2014    There were no vitals filed for this visit.  Subjective Assessment - 11/15/19 1602    Subjective  "Pretty good"    Currently in Pain?  Yes    Pain Score  1     Pain Location  Hip    Pain Orientation  Left                       [facility] Adult PT Treatment/Exercise - 11/15/19 0001      Knee/Hip Exercises: Stretches   Passive Hamstring Stretch  Both;4 reps;10 seconds    ITB Stretch  Left;5 reps;10 seconds    Piriformis Stretch  Right;5  reps;10 seconds;20 seconds    Other Knee/Hip Stretches  Single K2C 4x 15 sec each      Knee/Hip Exercises: Aerobic   Recumbent Bike  L0 x 4 min     Nustep  L3 x 6 min       Knee/Hip Exercises: Machines for Strengthening   Cybex Leg Press  40lb 3x10      Knee/Hip Exercises: Standing   Walking with Sports Cord  40lb 4 way x 3 each     Other Standing Knee Exercises  Hip abd & ext red 2x10       Modalities   Modalities  Iontophoresis      Iontophoresis   Type of Iontophoresis  Dexamethasone    Location  L GT    Dose  8m    Time  4 hour patch               PT Short Term Goals - 10/25/19 09937     PT SHORT TERM GOAL #1   Title  independent iwth initial HEP  Status  Achieved        PT Long Term Goals - 11/15/19 1615      PT LONG TERM GOAL #1   Title  understand posture and body mechanics    Status  Achieved      PT LONG TERM GOAL #2   Title  decrease pain overall 50%    Status  Achieved      PT LONG TERM GOAL #3   Title  report no pain getting in and out of the truck    Status  Achieved      PT LONG TERM GOAL #4   Title  go up stairs without difficulty    Status  Achieved            Plan - 11/15/19 1638    Clinical Impression Statement  Pt has progressed meeting all goals. He reprots no functional limitations and is pleased with hes current functional status    Rehab Potential  Good    PT Frequency  2x / week    PT Duration  8 weeks    PT Treatment/Interventions  ADLs/Self Care Home Management;Electrical Stimulation;Iontophoresis 3m/ml Dexamethasone;Cryotherapy;Moist Heat;Ultrasound;Stair training;Gait training;Therapeutic activities;Therapeutic exercise;Balance training;Neuromuscular re-education;Wheelchair mobility training;Patient/family education;Dry needling    PT Next Visit Plan  D/C PT       Patient will benefit from skilled therapeutic intervention in order to improve the following deficits and impairments:  Pain, Postural dysfunction,  Increased muscle spasms, Decreased strength, Decreased range of motion, Impaired flexibility, Difficulty walking  Visit Diagnosis: Difficulty in walking, not elsewhere classified  Pain in left hip     Problem List Patient Active Problem List   Diagnosis Date Noted  . BPH (benign prostatic hyperplasia) 11/10/2018  . Chest pain 05/19/2016  . Hepatitis C 11/06/2010  . CELLULITIS, HAND, LEFT 01/07/2010  . Essential hypertension 10/23/2008  . ALLERGIC RHINITIS 10/23/2008  . DIVERTICULOSIS, COLON 10/23/2008  . COLONIC POLYPS, HX OF 10/23/2008   PHYSICAL THERAPY DISCHARGE SUMMARY  Visits from Start of Care: 5 Plan: Patient agrees to discharge.  Patient goals were met. Patient is being discharged due to meeting the stated rehab goals.  ?????      RScot Jun, PTA 11/15/2019, 4:39 PM  [address] [address]Phone: [phone]  Fax:  [phone] Name: Jermaine ODEAMRN: [id number] of Birth: [birth year]

## 2019-11-17 ENCOUNTER — Telehealth: Payer: Self-pay | Admitting: *Deleted

## 2019-11-17 NOTE — Telephone Encounter (Signed)
   [facility] Pre-operative Risk Assessment    Request for surgical clearance:  1. What type of surgery is being performed? CEIOL OS w/KAHOOK GONIOTOMY   2. When is this surgery scheduled? TBD   3. What type of clearance is required (medical clearance vs. Pharmacy clearance to hold med vs. Both)? MEDICAL  4. Are there any medications that need to be held prior to surgery and how long? ASA   5. Practice name and name of physician performing surgery? [facility]; DR. Katy Fitch   6. What is your office phone number [phone]    7.   What is your office fax number [phone]  8.   Anesthesia type (None, local, MAC, general) ? MAC   Jermaine Brady 11/17/2019, 2:24 PM  _________________________________________________________________   (provider comments below)

## 2019-11-17 NOTE — Telephone Encounter (Signed)
   Primary Cardiologist: Sinclair Grooms, MD  Chart reviewed as part of pre-operative protocol coverage. Given past medical history and time since last visit, based on ACC/AHA guidelines, Jermaine Brady would be at acceptable risk for the planned procedure without further cardiovascular testing.   Per Dr. Daneen Schick he may hold his aspirin as long as needed prior to his upcoming eye surgery.  Please resume as soon as hemostasis is achieved.  I will route this recommendation to the requesting party via Epic fax function and remove from pre-op pool.  Please call with questions.  Jossie Ng. Damen Windsor NP-C    11/17/2019, 2:37 PM [address] Office [phone] Fax [phone]

## 2019-11-20 DIAGNOSIS — H2512 Age-related nuclear cataract, left eye: Secondary | ICD-10-CM | POA: Diagnosis not present

## 2019-11-24 DIAGNOSIS — H2512 Age-related nuclear cataract, left eye: Secondary | ICD-10-CM | POA: Diagnosis not present

## 2019-11-24 DIAGNOSIS — H401121 Primary open-angle glaucoma, left eye, mild stage: Secondary | ICD-10-CM | POA: Diagnosis not present

## 2019-12-03 ENCOUNTER — Other Ambulatory Visit: Payer: Self-pay | Admitting: Family Medicine

## 2020-03-04 ENCOUNTER — Other Ambulatory Visit: Payer: Self-pay | Admitting: Family Medicine

## 2020-03-05 ENCOUNTER — Telehealth: Payer: Self-pay | Admitting: Family Medicine

## 2020-03-05 NOTE — Telephone Encounter (Signed)
Attempted to schedule AWV. Unable to LVM.  Will try at later time.  

## 2020-03-13 ENCOUNTER — Ambulatory Visit (INDEPENDENT_AMBULATORY_CARE_PROVIDER_SITE_OTHER): Payer: PPO | Admitting: Family Medicine

## 2020-03-13 ENCOUNTER — Other Ambulatory Visit: Payer: Self-pay

## 2020-03-13 ENCOUNTER — Encounter: Payer: Self-pay | Admitting: Family Medicine

## 2020-03-13 VITALS — BP 128/70 | HR 84 | Temp 97.8°F | Wt 189.0 lb

## 2020-03-13 DIAGNOSIS — L600 Ingrowing nail: Secondary | ICD-10-CM

## 2020-03-13 NOTE — Progress Notes (Signed)
Established Patient Office Visit  Subjective:  Patient ID: Jermaine Brady, male    DOB: 1950/10/11  Age: 69 y.o. MRN: [id number]  CC:  Chief Complaint  Patient presents with  . Ingrown Toenail    left big toe     HPI [name] presents for painful left great toe.  He states for over a week now has had some mild swelling and drainage and mild erythema involving the medial border of the left great toe.  He has tried some salt water soaks without much improvement.  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  He would like to have something more definitive done at this time.  He denies any prior history of ingrown toenail issues except for around age 24.  No history of diabetes.  Last tetanus 2016  Past Medical History:  Diagnosis Date  . ALLERGIC RHINITIS 10/23/2008  . Arthritis    "neck" (05/19/2016)  . DIVERTICULOSIS, COLON 10/23/2008  . Heart murmur   . Hepatitis C    "don't know when I contracted this; went thru treatment in ~ 2007-2008" ="cured per pt"  . History of blood transfusion 2008?   "during tx to cure my Hep C"  . History of kidney stones   . HYPERTENSION 10/23/2008    Past Surgical History:  Procedure Laterality Date  . ABDOMINAL HERNIA REPAIR  2005  . ASD REPAIR  1971  . ASD REPAIR  age 54  . CARDIAC CATHETERIZATION  1960s - 1970s X 2   "at [facility]"  . COLON SURGERY  2007   ,resection, diverticular rupture  . COLONOSCOPY  06/12/2015  . HERNIA REPAIR  2005  . POLYPECTOMY    . SHOULDER ARTHROSCOPY W/ ROTATOR CUFF REPAIR Right 2014    Family History  Problem Relation Age of Onset  . Alcohol abuse Other   . Heart disease Other   . Alcohol abuse Father   . Colon cancer Neg Hx   . Colon polyps Neg Hx   . Esophageal cancer Neg Hx   . Rectal cancer Neg Hx   . Stomach cancer Neg Hx     Social History   Socioeconomic History  . Marital status: Married    Spouse name: Not on file  . Number of children: Not on file  .  Years of education: Not on file  . Highest education level: Not on file  Occupational History  . Not on file  Tobacco Use  . Smoking status: Never Smoker  . Smokeless tobacco: Never Used  Vaping Use  . Vaping Use: Never used  Substance and Sexual Activity  . Alcohol use: Yes    Alcohol/week: 2.0 standard drinks    Types: 2 Cans of beer per week  . Drug use: No  . Sexual activity: Yes  Other Topics Concern  . Not on file  Social History Narrative  . Not on file   Social Determinants of Health   Financial Resource Strain:   . Difficulty of Paying Living Expenses:   Food Insecurity:   . Worried About Charity fundraiser in the Last Year:   . Arboriculturist in the Last Year:   Transportation Needs:   . Film/video editor (Medical):   Marland Kitchen Lack of Transportation (Non-Medical):   Physical Activity:   . Days of Exercise per Week:   . Minutes of Exercise per Session:   Stress:   . Feeling  of Stress :   Social Connections:   . Frequency of Communication with Friends and Family:   . Frequency of Social Gatherings with Friends and Family:   . Attends Religious Services:   . Active Member of Clubs or Organizations:   . Attends Archivist Meetings:   Marland Kitchen Marital Status:   Intimate Partner Violence:   . Fear of Current or Ex-Partner:   . Emotionally Abused:   Marland Kitchen Physically Abused:   . Sexually Abused:     Outpatient Medications Prior to Visit  Medication Sig Dispense Refill  . aspirin EC 81 MG tablet Take 81 mg by mouth at bedtime.    Marland Kitchen azelastine (ASTELIN) 0.1 % nasal spray Place 2 sprays into both nostrils 2 (two) times daily. Use in each nostril as directed 30 mL 12  . Cholecalciferol (VITAMIN D3) 1000 UNITS CAPS Take 1,000 Units by mouth at bedtime.     . fexofenadine (ALLEGRA) 180 MG tablet Take 180 mg by mouth daily as needed for allergies.     Marland Kitchen lisinopril-hydrochlorothiazide (ZESTORETIC) 20-12.5 MG tablet TAKE 1 TABLET BY MOUTH EVERY DAY 90 tablet 0  .  naproxen sodium (ALEVE) 220 MG tablet Take 440 mg by mouth 2 (two) times daily as needed (pain/headache).    . sildenafil (REVATIO) 20 MG tablet Take 40 mg by mouth daily as needed (erectile dysfunction).    . Tamsulosin HCl (FLOMAX) 0.4 MG CAPS Take 0.4 mg by mouth at bedtime.      No facility-administered medications prior to visit.    No Known Allergies  ROS Review of Systems  Constitutional: Negative for chills and fever.      Objective:    Physical Exam Vitals reviewed.  Constitutional:      Appearance: Normal appearance.  Cardiovascular:     Rate and Rhythm: Normal rate and regular rhythm.  Skin:    Comments: Left great toe reveals some mild swelling and very minimal erythema along the medial border.  No granulation tissue.  Minimal amount of crusted drainage near the border  Neurological:     Mental Status: He is alert.     BP 128/70 (BP Location: Right Arm, Patient Position: Sitting, Cuff Size: Large)   Pulse 84   Temp 97.8 F (36.6 C) (Oral)   Wt 189 lb (85.7 kg)   SpO2 98%   BMI 25.63 kg/m  Wt Readings from Last 3 Encounters:  03/13/20 189 lb (85.7 kg)  09/23/19 192 lb 12.8 oz (87.5 kg)  09/14/19 190 lb (86.2 kg)     Health Maintenance Due  Topic Date Due  . INFLUENZA VACCINE  02/26/2020    There are no preventive care reminders to display for this patient.  Lab Results  Component Value Date   TSH 2.638 05/20/2016   Lab Results  Component Value Date   WBC 6.7 06/07/2018   HGB 16.2 06/07/2018   HCT 46.6 06/07/2018   MCV 87.7 06/07/2018   PLT 143.0 (L) 06/07/2018   Lab Results  Component Value Date   NA 139 06/07/2018   K 4.2 06/07/2018   CO2 29 06/07/2018   GLUCOSE 118 (H) 06/07/2018   BUN 32 (H) 06/07/2018   CREATININE 1.16 06/07/2018   BILITOT 0.7 06/07/2018   ALKPHOS 70 06/07/2018   AST 13 06/07/2018   ALT 16 06/07/2018   PROT 6.7 06/07/2018   ALBUMIN 4.7 06/07/2018   CALCIUM 9.7 06/07/2018   ANIONGAP 9 08/14/2017   GFR 66.50  06/07/2018  Lab Results  Component Value Date   CHOL 128 05/13/2017   Lab Results  Component Value Date   HDL 29 (L) 05/13/2017   Lab Results  Component Value Date   LDLCALC 86 05/13/2017   Lab Results  Component Value Date   TRIG 64 05/13/2017   Lab Results  Component Value Date   CHOLHDL 4.4 05/13/2017   Lab Results  Component Value Date   HGBA1C 5.4 05/19/2016      Assessment & Plan:   Problem List Items Addressed This Visit    None    Visit Diagnoses    Ingrown left greater toenail    -  Primary    Patient has tried conservative measures with salt water soaks without much improvement.  We discussed risk of partial nail evulsion including risk of bruising, bleeding, and postoperative pain and patient consents.  We prepped the toe with Betadine.  Using 25-gauge 1 inch needle we performed digital block with 2% plain Xylocaine and achieved full block.  We used straight hemostats and loosened up to one quarter of the involved portion of the nail and then used surgical iris scissors and cut the nail from the distal edge down all the way to the base and removed the involved ingrown fragment with curved hemostats without difficulty.  Minimal bleeding.  Dressing applied with Vaseline, Telfa, and light Coban wrap  Wound care instruction given.  He will elevate foot frequently today and starting tomorrow clean daily with soap and water and apply some Vaseline and keep covered for at least a week.  He is aware will take about 9 to 12 months to grow out full nail and we reviewed appropriate trimming of nail as this grows out  No orders of the defined types were placed in this encounter.   Follow-up: No follow-ups on file.    Carolann Littler, MD

## 2020-03-13 NOTE — Patient Instructions (Signed)
Ingrown Toenail An ingrown toenail occurs when the corner or sides of a toenail grow into the surrounding skin. This causes discomfort and pain. The big toe is most commonly affected, but any of the toes can be affected. If an ingrown toenail is not treated, it can become infected. What are the causes? This condition may be caused by:  Wearing shoes that are too small or tight.  An injury, such as stubbing your toe or having your toe stepped on.  Improper cutting or care of your toenails.  Having nail or foot abnormalities that were present from birth (congenital abnormalities), such as having a nail that is too big for your toe. What increases the risk? The following factors may make you more likely to develop ingrown toenails:  Age. Nails tend to get thicker with age, so ingrown nails are more common among older people.  Cutting your toenails incorrectly, such as cutting them very short or cutting them unevenly. An ingrown toenail is more likely to get infected if you have:  Diabetes.  Blood flow (circulation) problems. What are the signs or symptoms? Symptoms of an ingrown toenail may include:  Pain, soreness, or tenderness.  Redness.  Swelling.  Hardening of the skin that surrounds the toenail. Signs that an ingrown toenail may be infected include:  Fluid or pus.  Symptoms that get worse instead of better. How is this diagnosed? An ingrown toenail may be diagnosed based on your medical history, your symptoms, and a physical exam. If you have fluid or blood coming from your toenail, a sample may be collected to test for the specific type of bacteria that is causing the infection. How is this treated? Treatment depends on how severe your ingrown toenail is. You may be able to care for your toenail at home.  If you have an infection, you may be prescribed antibiotic medicines.  If you have fluid or pus draining from your toenail, your health care provider may drain  it.  If you have trouble walking, you may be given crutches to use.  If you have a severe or infected ingrown toenail, you may need a procedure to remove part or all of the nail. Follow these instructions at home: Foot care   Do not pick at your toenail or try to remove it yourself.  Soak your foot in warm, soapy water. Do this for 20 minutes, 3 times a day, or as often as told by your health care provider. This helps to keep your toe clean and keep your skin soft.  Wear shoes that fit well and are not too tight. Your health care provider may recommend that you wear open-toed shoes while you heal.  Trim your toenails regularly and carefully. Cut your toenails straight across to prevent injury to the skin at the corners of the toenail. Do not cut your nails in a curved shape.  Keep your feet clean and dry to help prevent infection. Medicines  Take over-the-counter and prescription medicines only as told by your health care provider.  If you were prescribed an antibiotic, take it as told by your health care provider. Do not stop taking the antibiotic even if you start to feel better. Activity  Return to your normal activities as told by your health care provider. Ask your health care provider what activities are safe for you.  Avoid activities that cause pain. General instructions  If your health care provider told you to use crutches to help you move around, use them   as instructed.  Keep all follow-up visits as told by your health care provider. This is important. Contact a health care provider if:  You have more redness, swelling, pain, or other symptoms that do not improve with treatment.  You have fluid, blood, or pus coming from your toenail. Get help right away if:  You have a red streak on your skin that starts at your foot and spreads up your leg.  You have a fever. Summary  An ingrown toenail occurs when the corner or sides of a toenail grow into the surrounding  skin. This causes discomfort and pain. The big toe is most commonly affected, but any of the toes can be affected.  If an ingrown toenail is not treated, it can become infected.  Fluid or pus draining from your toenail is a sign of infection. Your health care provider may need to drain it. You may be given antibiotics to treat the infection.  Trimming your toenails regularly and properly can help you prevent an ingrown toenail. This information is not intended to replace advice given to you by your health care provider. Make sure you discuss any questions you have with your health care provider. Document Revised: 11/05/2018 Document Reviewed: 04/01/2017 Elsevier Patient Education  2020 [employer] Inc[employer]  

## 2020-05-01 DIAGNOSIS — Z961 Presence of intraocular lens: Secondary | ICD-10-CM | POA: Diagnosis not present

## 2020-05-01 DIAGNOSIS — H401121 Primary open-angle glaucoma, left eye, mild stage: Secondary | ICD-10-CM | POA: Diagnosis not present

## 2020-05-01 DIAGNOSIS — H401112 Primary open-angle glaucoma, right eye, moderate stage: Secondary | ICD-10-CM | POA: Diagnosis not present

## 2020-06-05 ENCOUNTER — Encounter: Payer: PPO | Admitting: Family Medicine

## 2020-06-13 ENCOUNTER — Ambulatory Visit (INDEPENDENT_AMBULATORY_CARE_PROVIDER_SITE_OTHER): Payer: PPO | Admitting: Family Medicine

## 2020-06-13 ENCOUNTER — Other Ambulatory Visit: Payer: Self-pay

## 2020-06-13 ENCOUNTER — Encounter: Payer: Self-pay | Admitting: Family Medicine

## 2020-06-13 VITALS — BP 119/65 | HR 69 | Ht 72.0 in | Wt 189.0 lb

## 2020-06-13 DIAGNOSIS — Z0000 Encounter for general adult medical examination without abnormal findings: Secondary | ICD-10-CM | POA: Diagnosis not present

## 2020-06-13 LAB — BASIC METABOLIC PANEL
BUN: 25 mg/dL — ABNORMAL HIGH (ref 6–23)
CO2: 31 mEq/L (ref 19–32)
Calcium: 9.9 mg/dL (ref 8.4–10.5)
Chloride: 102 mEq/L (ref 96–112)
Creatinine, Ser: 1.1 mg/dL (ref 0.40–1.50)
GFR: 68.14 mL/min (ref >60.00)
Glucose, Bld: 101 mg/dL — ABNORMAL HIGH (ref 70–99)
Potassium: 4.2 mEq/L (ref 3.5–5.1)
Sodium: 140 mEq/L (ref 135–145)

## 2020-06-13 LAB — LIPID PANEL
Cholesterol: 132 mg/dL (ref 0–200)
HDL: 29.7 mg/dL — ABNORMAL LOW (ref >39.00)
LDL Cholesterol: 89 mg/dL (ref 0–99)
NonHDL: 102.51
Total CHOL/HDL Ratio: 4
Triglycerides: 66 mg/dL (ref 0.0–149.0)
VLDL: 13.2 mg/dL (ref 0.0–40.0)

## 2020-06-13 LAB — CBC WITH DIFFERENTIAL/PLATELET
Basophils Absolute: 0 10*3/uL (ref 0.0–0.1)
Basophils Relative: 0.8 % (ref 0.0–3.0)
Eosinophils Absolute: 0.2 10*3/uL (ref 0.0–0.7)
Eosinophils Relative: 3.5 % (ref 0.0–5.0)
HCT: 46.4 % (ref 39.0–52.0)
Hemoglobin: 16.1 g/dL (ref 13.0–17.0)
Lymphocytes Relative: 19.2 % (ref 12.0–46.0)
Lymphs Abs: 1 10*3/uL (ref 0.7–4.0)
MCHC: 34.8 g/dL (ref 30.0–36.0)
MCV: 86 fl (ref 78.0–100.0)
Monocytes Absolute: 0.5 10*3/uL (ref 0.1–1.0)
Monocytes Relative: 8.6 % (ref 3.0–12.0)
Neutro Abs: 3.5 10*3/uL (ref 1.4–7.7)
Neutrophils Relative %: 67.9 % (ref 43.0–77.0)
Platelets: 136 10*3/uL — ABNORMAL LOW (ref 150.0–400.0)
RBC: 5.39 Mil/uL (ref 4.22–5.81)
RDW: 15.2 % (ref 11.5–15.5)
WBC: 5.2 10*3/uL (ref 4.0–10.5)

## 2020-06-13 LAB — HEPATIC FUNCTION PANEL
ALT: 17 U/L (ref 0–53)
AST: 14 U/L (ref 0–37)
Albumin: 4.5 g/dL (ref 3.5–5.2)
Alkaline Phosphatase: 65 U/L (ref 39–117)
Bilirubin, Direct: 0.2 mg/dL (ref 0.0–0.3)
Total Bilirubin: 0.8 mg/dL (ref 0.2–1.2)
Total Protein: 6.5 g/dL (ref 6.0–8.3)

## 2020-06-13 LAB — TSH: TSH: 1.98 u[IU]/mL (ref 0.35–4.50)

## 2020-06-13 MED ORDER — LISINOPRIL-HYDROCHLOROTHIAZIDE 20-12.5 MG PO TABS
1.0000 | ORAL_TABLET | Freq: Every day | ORAL | 3 refills | Status: DC
Start: 2020-06-13 — End: 2021-05-29

## 2020-06-13 NOTE — Addendum Note (Signed)
Addended by: Marrion Coy on: 06/13/2020 08:16 AM   Modules accepted: Orders

## 2020-06-13 NOTE — Patient Instructions (Signed)

## 2020-06-13 NOTE — Progress Notes (Signed)
Labs all stable.  HDL is chronically low and unchanged.  He also has chronically low platelets but only minimally low and stable.  Liver enzymes are normal

## 2020-06-13 NOTE — Progress Notes (Signed)
Established Patient Office Visit  Subjective:  Patient ID: Jermaine Brady, male    DOB: Feb 15, 1951  Age: 69 y.o. MRN: [id number]  CC:  Chief Complaint  Patient presents with  . Annual Exam    HPI Jermaine Brady presents for physical exam.  He has history of hypertension, history of treated hepatitis C, chronic sensorineural hearing loss, BPH.  He is followed yearly by urology and gets PSAs through them.  He still works full-time with [employer].  He enjoys work and plans to work as long as he can. Generally feels well at this time.  No specific complaints.  Maintenance reviewed  -Flu vaccine already given -Covid vaccine given -Pneumonia vaccines up-to-date -Had prior Zostavax 2014.  Undecided regarding Shingrix -Tetanus due 2026 -Colonoscopy due 2/23  Social history-married.  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  Non-smoker.  No regular alcohol use.  Family history-reviewed with no changes  Past Medical History:  Diagnosis Date  . ALLERGIC RHINITIS 10/23/2008  . Arthritis    "neck" (05/19/2016)  . DIVERTICULOSIS, COLON 10/23/2008  . Heart murmur   . Hepatitis C    "don't know when I contracted this; went thru treatment in ~ 2007-2008" ="cured per pt"  . History of blood transfusion 2008?   "during tx to cure my Hep C"  . History of kidney stones   . HYPERTENSION 10/23/2008    Past Surgical History:  Procedure Laterality Date  . ABDOMINAL HERNIA REPAIR  2005  . ASD REPAIR  1971  . ASD REPAIR  age 28  . CARDIAC CATHETERIZATION  1960s - 1970s X 2   "at [facility]"  . COLON SURGERY  2007   ,resection, diverticular rupture  . COLONOSCOPY  06/12/2015  . HERNIA REPAIR  2005  . POLYPECTOMY    . SHOULDER ARTHROSCOPY W/ ROTATOR CUFF REPAIR Right 2014    Family History  Problem Relation Age of Onset  . Alcohol abuse Other   . Heart disease Other   . Alcohol abuse Father   . Colon cancer Neg Hx   . Colon polyps Neg Hx   . Esophageal cancer Neg Hx   . Rectal cancer Neg Hx     . Stomach cancer Neg Hx     Social History   Socioeconomic History  . Marital status: Married    Spouse name: Not on file  . Number of children: Not on file  . Years of education: Not on file  . Highest education level: Not on file  Occupational History  . Not on file  Tobacco Use  . Smoking status: Never Smoker  . Smokeless tobacco: Never Used  Vaping Use  . Vaping Use: Never used  Substance and Sexual Activity  . Alcohol use: Yes    Alcohol/week: 2.0 standard drinks    Types: 2 Cans of beer per week  . Drug use: No  . Sexual activity: Yes  Other Topics Concern  . Not on file  Social History Narrative  . Not on file   Social Determinants of Health   Financial Resource Strain:   . Difficulty of Paying Living Expenses: Not on file  Food Insecurity:   . Worried About Charity fundraiser in the Last Year: Not on file  . Ran Out of Food in the Last Year: Not on file  Transportation Needs:   . Lack of Transportation (Medical): Not on file  . Lack of Transportation (Non-Medical): Not on file  Physical Activity:   . Days of Exercise  per Week: Not on file  . Minutes of Exercise per Session: Not on file  Stress:   . Feeling of Stress : Not on file  Social Connections:   . Frequency of Communication with Friends and Family: Not on file  . Frequency of Social Gatherings with Friends and Family: Not on file  . Attends Religious Services: Not on file  . Active Member of Clubs or Organizations: Not on file  . Attends Archivist Meetings: Not on file  . Marital Status: Not on file  Intimate Partner Violence:   . Fear of Current or Ex-Partner: Not on file  . Emotionally Abused: Not on file  . Physically Abused: Not on file  . Sexually Abused: Not on file    Outpatient Medications Prior to Visit  Medication Sig Dispense Refill  . aspirin EC 81 MG tablet Take 81 mg by mouth at bedtime.    Marland Kitchen azelastine (ASTELIN) 0.1 % nasal spray Place 2 sprays into both nostrils  2 (two) times daily. Use in each nostril as directed 30 mL 12  . Cholecalciferol (VITAMIN D3) 1000 UNITS CAPS Take 1,000 Units by mouth at bedtime.     . fexofenadine (ALLEGRA) 180 MG tablet Take 180 mg by mouth daily as needed for allergies.     Marland Kitchen latanoprost (XALATAN) 0.005 % ophthalmic solution 1 drop daily.    . naproxen sodium (ALEVE) 220 MG tablet Take 440 mg by mouth 2 (two) times daily as needed (pain/headache).    . sildenafil (REVATIO) 20 MG tablet Take 40 mg by mouth daily as needed (erectile dysfunction).    . Tamsulosin HCl (FLOMAX) 0.4 MG CAPS Take 0.4 mg by mouth at bedtime.     Marland Kitchen lisinopril-hydrochlorothiazide (ZESTORETIC) 20-12.5 MG tablet TAKE 1 TABLET BY MOUTH EVERY DAY 90 tablet 0   No facility-administered medications prior to visit.    No Known Allergies  ROS Review of Systems  Constitutional: Negative for activity change, appetite change, fatigue and fever.  HENT: Negative for congestion, ear pain and trouble swallowing.   Eyes: Negative for pain and visual disturbance.  Respiratory: Negative for cough, shortness of breath and wheezing.   Cardiovascular: Negative for chest pain and palpitations.  Gastrointestinal: Negative for abdominal distention, abdominal pain, blood in stool, constipation, diarrhea, nausea, rectal pain and vomiting.  Genitourinary: Negative for dysuria, hematuria and testicular pain.  Musculoskeletal: Negative for arthralgias and joint swelling.  Skin: Negative for rash.  Neurological: Negative for dizziness, syncope and headaches.  Hematological: Negative for adenopathy.  Psychiatric/Behavioral: Negative for confusion and dysphoric mood.      Objective:    Physical Exam Constitutional:      General: He is not in acute distress.    Appearance: He is well-developed.  HENT:     Head: Normocephalic and atraumatic.     Right Ear: External ear normal.     Left Ear: External ear normal.  Eyes:     Conjunctiva/sclera: Conjunctivae normal.      Pupils: Pupils are equal, round, and reactive to light.  Neck:     Thyroid: No thyromegaly.  Cardiovascular:     Rate and Rhythm: Normal rate and regular rhythm.     Heart sounds: Normal heart sounds. No murmur heard.   Pulmonary:     Effort: No respiratory distress.     Breath sounds: No wheezing or rales.  Abdominal:     General: Bowel sounds are normal. There is no distension.     Palpations: Abdomen  is soft. There is no mass.     Tenderness: There is no abdominal tenderness. There is no guarding or rebound.  Musculoskeletal:     Cervical back: Normal range of motion and neck supple.     Right lower leg: No edema.     Left lower leg: No edema.  Lymphadenopathy:     Cervical: No cervical adenopathy.  Skin:    Findings: No rash.  Neurological:     Mental Status: He is alert and oriented to person, place, and time.     Cranial Nerves: No cranial nerve deficit.     Deep Tendon Reflexes: Reflexes normal.     BP 119/65   Pulse 69   Ht 6' (1.829 m)   Wt 189 lb (85.7 kg)   BMI 25.63 kg/m  Wt Readings from Last 3 Encounters:  06/13/20 189 lb (85.7 kg)  03/13/20 189 lb (85.7 kg)  09/23/19 192 lb 12.8 oz (87.5 kg)     There are no preventive care reminders to display for this patient.  There are no preventive care reminders to display for this patient.  Lab Results  Component Value Date   TSH 2.638 05/20/2016   Lab Results  Component Value Date   WBC 6.7 06/07/2018   HGB 16.2 06/07/2018   HCT 46.6 06/07/2018   MCV 87.7 06/07/2018   PLT 143.0 (L) 06/07/2018   Lab Results  Component Value Date   NA 139 06/07/2018   K 4.2 06/07/2018   CO2 29 06/07/2018   GLUCOSE 118 (H) 06/07/2018   BUN 32 (H) 06/07/2018   CREATININE 1.16 06/07/2018   BILITOT 0.7 06/07/2018   ALKPHOS 70 06/07/2018   AST 13 06/07/2018   ALT 16 06/07/2018   PROT 6.7 06/07/2018   ALBUMIN 4.7 06/07/2018   CALCIUM 9.7 06/07/2018   ANIONGAP 9 08/14/2017   GFR 66.50 06/07/2018   Lab  Results  Component Value Date   CHOL 128 05/13/2017   Lab Results  Component Value Date   HDL 29 (L) 05/13/2017   Lab Results  Component Value Date   LDLCALC 86 05/13/2017   Lab Results  Component Value Date   TRIG 64 05/13/2017   Lab Results  Component Value Date   CHOLHDL 4.4 05/13/2017   Lab Results  Component Value Date   HGBA1C 5.4 05/19/2016      Assessment & Plan:   Problem List Items Addressed This Visit    None    Visit Diagnoses    Physical exam    -  Primary   Relevant Orders   Basic metabolic panel   Lipid panel   CBC with Differential/Platelet   TSH   Hepatic function panel    -We discussed Shingrix vaccine and he will consider. -Continue with annual flu vaccine -Refill lisinopril HCTZ for 1 year -He plans to get follow-up PSA through urology.  Meds ordered this encounter  Medications  . lisinopril-hydrochlorothiazide (ZESTORETIC) 20-12.5 MG tablet    Sig: Take 1 tablet by mouth daily.    Dispense:  90 tablet    Refill:  3    Follow-up: No follow-ups on file.    Carolann Littler, MD

## 2020-07-31 DIAGNOSIS — Z20828 Contact with and (suspected) exposure to other viral communicable diseases: Secondary | ICD-10-CM | POA: Diagnosis not present

## 2020-08-27 DIAGNOSIS — N401 Enlarged prostate with lower urinary tract symptoms: Secondary | ICD-10-CM | POA: Diagnosis not present

## 2020-08-27 DIAGNOSIS — N5201 Erectile dysfunction due to arterial insufficiency: Secondary | ICD-10-CM | POA: Diagnosis not present

## 2020-08-27 DIAGNOSIS — R351 Nocturia: Secondary | ICD-10-CM | POA: Diagnosis not present

## 2020-09-04 DIAGNOSIS — Z961 Presence of intraocular lens: Secondary | ICD-10-CM | POA: Diagnosis not present

## 2020-09-04 DIAGNOSIS — H401121 Primary open-angle glaucoma, left eye, mild stage: Secondary | ICD-10-CM | POA: Diagnosis not present

## 2020-09-04 DIAGNOSIS — H401112 Primary open-angle glaucoma, right eye, moderate stage: Secondary | ICD-10-CM | POA: Diagnosis not present

## 2020-10-04 ENCOUNTER — Telehealth: Payer: Self-pay | Admitting: Family Medicine

## 2020-10-04 NOTE — Telephone Encounter (Signed)
Left message for patient to call back and schedule Medicare Annual Wellness Visit (AWV) either virtually or in office. No detailed message  Last AWV 11/10/2018  please schedule at anytime with [facility] Nurse Health Advisor 1 or 2   This should be a 45 minute visit.

## 2020-10-05 NOTE — Telephone Encounter (Signed)
Pt called back and has been scheduled.

## 2020-10-08 NOTE — Progress Notes (Signed)
Subjective:   Jermaine Brady is a 70 y.o. male who presents for Medicare Annual/Subsequent preventive examination.  I connected with Jermaine Brady today by telephone and verified that I am speaking with the correct person using two identifiers. Location patient: home Location provider: work Persons participating in the virtual visit: patient, provider.   I discussed the limitations, risks, security and privacy concerns of performing an evaluation and management service by telephone and the availability of in person appointments. I also discussed with the patient that there may be a patient responsible charge related to this service. The patient expressed understanding and verbally consented to this telephonic visit.    Interactive audio and video telecommunications were attempted between this provider and patient, however failed, due to patient having technical difficulties OR patient did not have access to video capability.  We continued and completed visit with audio only.      Review of Systems    N/A  Cardiac Risk Factors include: advanced age (>36men, >38 women);male gender;hypertension     Objective:    Today's Vitals   There is no height or weight on file to calculate BMI.  Advanced Directives 10/09/2020 10/10/2019 05/19/2016 05/29/2015  Does Patient Have a Medical Advance Directive? Yes No No Yes  Type of Paramedic of [place];Living will - - [place];Living will  Does patient want to make changes to medical advance directive? No - Patient declined - - No - Patient declined  Copy of [place] in Chart? No - copy requested - - No - copy requested  Would patient like information on creating a medical advance directive? - No - Patient declined No - patient declined information -    Current Medications (verified) Outpatient Encounter Medications as of 10/09/2020  Medication Sig   aspirin EC 81 MG tablet Take  81 mg by mouth at bedtime.   azelastine (ASTELIN) 0.1 % nasal spray Place 2 sprays into both nostrils 2 (two) times daily. Use in each nostril as directed   Cholecalciferol (VITAMIN D3) 1000 UNITS CAPS Take 1,000 Units by mouth at bedtime.    fexofenadine (ALLEGRA) 180 MG tablet Take 180 mg by mouth daily as needed for allergies.   latanoprost (XALATAN) 0.005 % ophthalmic solution 1 drop daily.   lisinopril-hydrochlorothiazide (ZESTORETIC) 20-12.5 MG tablet Take 1 tablet by mouth daily.   naproxen sodium (ALEVE) 220 MG tablet Take 440 mg by mouth 2 (two) times daily as needed (pain/headache).   sildenafil (REVATIO) 20 MG tablet Take 40 mg by mouth daily as needed (erectile dysfunction).   tamsulosin (FLOMAX) 0.4 MG CAPS capsule Take 0.4 mg by mouth at bedtime.   No facility-administered encounter medications on file as of 10/09/2020.    Allergies (verified) Patient has no known allergies.   History: Past Medical History:  Diagnosis Date   ALLERGIC RHINITIS 10/23/2008   Arthritis    "neck" (05/19/2016)   DIVERTICULOSIS, COLON 10/23/2008   Heart murmur    Hepatitis C    "don't know when I contracted this; went thru treatment in ~ 2007-2008" ="cured per pt"   History of blood transfusion 2008?   "during tx to cure my Hep C"   History of kidney stones    HYPERTENSION 10/23/2008   Past Surgical History:  Procedure Laterality Date   ABDOMINAL HERNIA REPAIR  2005   ASD REPAIR  1971   ASD REPAIR  age 31   CARDIAC CATHETERIZATION  1960s - 39s X 2   "  at [facility]"   COLON SURGERY  2007   ,resection, diverticular rupture   COLONOSCOPY  06/12/2015   HERNIA REPAIR  2005   POLYPECTOMY     SHOULDER ARTHROSCOPY W/ ROTATOR CUFF REPAIR Right 2014   Family History  Problem Relation Age of Onset   Alcohol abuse Other    Heart disease Other    Alcohol abuse Father    Colon cancer Neg Hx    Colon polyps Neg Hx    Esophageal cancer Neg Hx    Rectal cancer Neg Hx     Stomach cancer Neg Hx    Social History   Socioeconomic History   Marital status: Married    Spouse name: Not on file   Number of children: Not on file   Years of education: Not on file   Highest education level: Not on file  Occupational History   Not on file  Tobacco Use   Smoking status: Never Smoker   Smokeless tobacco: Never Used  Vaping Use   Vaping Use: Never used  Substance and Sexual Activity   Alcohol use: Yes    Alcohol/week: 2.0 standard drinks    Types: 2 Cans of beer per week   Drug use: No   Sexual activity: Yes  Other Topics Concern   Not on file  Social History Narrative   Not on file   Social Determinants of Health   Financial Resource Strain: Low Risk    Difficulty of Paying Living Expenses: Not hard at all  Food Insecurity: No Food Insecurity   Worried About Charity fundraiser in the Last Year: Never true   [place] in the Last Year: Never true  Transportation Needs: No Transportation Needs   Lack of Transportation (Medical): No   Lack of Transportation (Non-Medical): No  Physical Activity: Sufficiently Active   Days of Exercise per Week: 7 days   Minutes of Exercise per Session: 30 min  Stress: No Stress Concern Present   Feeling of Stress : Not at all  Social Connections: Moderately Integrated   Frequency of Communication with Friends and Family: More than three times a week   Frequency of Social Gatherings with Friends and Family: More than three times a week   Attends Religious Services: 1 to 4 times per year   Active Member of [employer] or Organizations: No   Attends Music therapist: Never   Marital Status: Married    Tobacco Counseling Counseling given: Not Answered   Clinical Intake:  Pre-visit preparation completed: Yes  Pain : No/denies pain     Nutritional Risks: None Diabetes: No  How often do you need to have someone help you when you read instructions, pamphlets, or  other written materials from your doctor or pharmacy?: 1 - Never  Diabetic?No  Interpreter Needed?: No  Information entered by :: [place] of Daily Living In your present state of health, do you have any difficulty performing the following activities: 10/09/2020  Hearing? Y  Comment has bilateral hearing aids  Vision? N  Difficulty concentrating or making decisions? N  Walking or climbing stairs? N  Dressing or bathing? N  Doing errands, shopping? N  Preparing Food and eating ? N  Using the Toilet? N  In the past six months, have you accidently leaked urine? N  Do you have problems with loss of bowel control? N  Managing your Medications? N  Managing your Finances? N  Housekeeping or managing your Housekeeping?  N  Some recent data might be hidden    Patient Care Team: Eulas Post, MD as PCP - General Belva Crome, MD as PCP - Cardiology (Cardiology)  Indicate any recent Medical Services you may have received from other than [facility] providers in the past year (date may be approximate).     Assessment:   This is a routine wellness examination for [place].  Hearing/Vision screen  Hearing Screening   125Hz  250Hz  500Hz  1000Hz  2000Hz  3000Hz  4000Hz  6000Hz  8000Hz   Right ear:           Left ear:           Vision Screening Comments: Gets annual eye exams. Has hx of cataract surgery  Dietary issues and exercise activities discussed: Current Exercise Habits: Home exercise routine, Type of exercise: walking, Time (Minutes): 30, Frequency (Times/Week): 7, Weekly Exercise (Minutes/Week): 210, Exercise limited by: None identified  Goals     Patient Stated     I would to maintaining my current health and keep working!       Depression Screen PHQ 2/9 Scores 10/09/2020 09/02/2019 06/07/2018 06/03/2016  PHQ - 2 Score 0 0 0 0  PHQ- 9 Score - 0 - -    Fall Risk Fall Risk  10/09/2020 06/22/2019 02/01/2018 06/03/2016  Falls in the past year? 0 0 No No  Comment -  Emmi Telephone Survey: data to providers prior to load - -  Number falls in past yr: 0 - - -  Injury with Fall? 0 - - -  Risk for fall due to : No Fall Risks - - -  Follow up Falls evaluation completed;Falls prevention discussed - - -    FALL RISK PREVENTION PERTAINING TO THE HOME:  Any stairs in or around the home? Yes  If so, are there any without handrails? No  Home free of loose throw rugs in walkways, pet beds, electrical cords, etc? Yes  Adequate lighting in your home to reduce risk of falls? Yes   ASSISTIVE DEVICES UTILIZED TO PREVENT FALLS:  Life alert? No  Use of a cane, walker or w/c? No  Grab bars in the bathroom? No  Shower chair or bench in shower? Yes  Elevated toilet seat or a handicapped toilet? Yes   Cognitive Function:   Normal cognitive status assessed by direct observation by this Nurse Health Advisor. No abnormalities found.        Immunizations Immunization History  Administered Date(s) Administered   Fluad Quad(high Dose 65+) 05/09/2019   Influenza, High Dose Seasonal PF 08/21/2017, 06/07/2018, 05/13/2020   Influenza,inj,Quad PF,6+ Mos 06/07/2013, 07/31/2014, 05/20/2016   Influenza,trivalent, recombinat, inj, PF 06/05/2014   PFIZER(Purple Top)SARS-COV-2 Vaccination 09/03/2019, 09/28/2019   Pneumococcal Conjugate-13 08/21/2017   Pneumococcal Polysaccharide-23 05/20/2016   Td 07/28/2004   Tdap 12/22/2014   Zoster 10/25/2012    TDAP status: Up to date  Flu Vaccine status: Up to date  Pneumococcal vaccine status: Up to date  Covid-19 vaccine status: Completed vaccines  Qualifies for Shingles Vaccine? Yes   Zostavax completed Yes   Shingrix Completed?: No.    Education has been provided regarding the importance of this vaccine. Patient has been advised to call insurance company to determine out of pocket expense if they have not yet received this vaccine. Advised may also receive vaccine at local pharmacy or Health Dept. Verbalized  acceptance and understanding.  Screening Tests Health Maintenance  Topic Date Due   COVID-19 Vaccine (3 - Booster for Pfizer series) 03/30/2020  COLONOSCOPY (Pts 45-14yrs Insurance coverage will need to be confirmed)  09/03/2021   TETANUS/TDAP  12/21/2024   INFLUENZA VACCINE  Completed   Hepatitis C Screening  Completed   PNA vac Low Risk Adult  Completed   HPV VACCINES  Aged Out    Health Maintenance  Health Maintenance Due  Topic Date Due   COVID-19 Vaccine (3 - Booster for Pfizer series) 03/30/2020    Colorectal cancer screening: Type of screening: Colonoscopy. Completed 09/03/2018. Repeat every 3 years  Lung Cancer Screening: (Low Dose CT Chest recommended if Age 75-80 years, 30 pack-year currently smoking OR have quit w/in 15years.) does not qualify.   Lung Cancer Screening Referral: N/A   Additional Screening:  Hepatitis C Screening: does qualify;   Vision Screening: Recommended annual ophthalmology exams for early detection of glaucoma and other disorders of the eye. Is the patient up to date with their annual eye exam?  Yes  Who is the provider or what is the name of the office in which the patient attends annual eye exams? Dr. Katy Fitch  If pt is not established with a provider, would they like to be referred to a provider to establish care? No .   Dental Screening: Recommended annual dental exams for proper oral hygiene  Community Resource Referral / Chronic Care Management: CRR required this visit?  No   CCM required this visit?  No      Plan:     I have personally reviewed and noted the following in the patients chart:    Medical and social history  Use of alcohol, tobacco or illicit drugs   Current medications and supplements  Functional ability and status  Nutritional status  Physical activity  Advanced directives  List of other physicians  Hospitalizations, surgeries, and ER visits in previous 12 months  Vitals  Screenings to  include cognitive, depression, and falls  Referrals and appointments  In addition, I have reviewed and discussed with patient certain preventive protocols, quality metrics, and best practice recommendations. A written personalized care plan for preventive services as well as general preventive health recommendations were provided to patient.     Ofilia Neas, LPN   09/08/9415   Nurse Notes: None

## 2020-10-09 ENCOUNTER — Ambulatory Visit (INDEPENDENT_AMBULATORY_CARE_PROVIDER_SITE_OTHER): Payer: PPO

## 2020-10-09 DIAGNOSIS — Z0000 Encounter for general adult medical examination without abnormal findings: Secondary | ICD-10-CM

## 2020-10-09 NOTE — Patient Instructions (Signed)
Mr. Jermaine Brady , Thank you for taking time to come for your Medicare Wellness Visit. I appreciate your ongoing commitment to your health goals. Please review the following plan we discussed and let me know if I can assist you in the future.   Screening recommendations/referrals: Colonoscopy: Up to date, next due 09/03/2021 Recommended yearly ophthalmology/optometry visit for glaucoma screening and checkup Recommended yearly dental visit for hygiene and checkup  Vaccinations: Influenza vaccine: Up to date, next due fall 2022  Pneumococcal vaccine: Completed series  Tdap vaccine: Up to date, next due 12/21/2024 Shingles vaccine: Currently due for Shingrix, if you wish to receive you may do so at your local pharmacy as it is less expensive.    Advanced directives: Please bring in a copy of your advanced medical directives so that we may scan into your chart.   Conditions/risks identified: None   Next appointment: None   Preventive Care 65 Years and Older, Male Preventive care refers to lifestyle choices and visits with your health care provider that can promote health and wellness. What does preventive care include?  A yearly physical exam. This is also called an annual well check.  Dental exams once or twice a year.  Routine eye exams. Ask your health care provider how often you should have your eyes checked.  Personal lifestyle choices, including:  Daily care of your teeth and gums.  Regular physical activity.  Eating a healthy diet.  Avoiding tobacco and drug use.  Limiting alcohol use.  Practicing safe sex.  Taking low doses of aspirin every day.  Taking vitamin and mineral supplements as recommended by your health care provider. What happens during an annual well check? The services and screenings done by your health care provider during your annual well check will depend on your age, overall health, lifestyle risk factors, and family history of disease. Counseling   Your health care provider may ask you questions about your:  Alcohol use.  Tobacco use.  Drug use.  Emotional well-being.  Home and relationship well-being.  Sexual activity.  Eating habits.  History of falls.  Memory and ability to understand (cognition).  Work and work Statistician. Screening  You may have the following tests or measurements:  Height, weight, and BMI.  Blood pressure.  Lipid and cholesterol levels. These may be checked every 5 years, or more frequently if you are over 73 years old.  Skin check.  Lung cancer screening. You may have this screening every year starting at age 55 if you have a 30-pack-year history of smoking and currently smoke or have quit within the past 15 years.  Fecal occult blood test (FOBT) of the stool. You may have this test every year starting at age 24.  Flexible sigmoidoscopy or colonoscopy. You may have a sigmoidoscopy every 5 years or a colonoscopy every 10 years starting at age 55.  Prostate cancer screening. Recommendations will vary depending on your family history and other risks.  Hepatitis C blood test.  Hepatitis B blood test.  Sexually transmitted disease (STD) testing.  Diabetes screening. This is done by checking your blood sugar (glucose) after you have not eaten for a while (fasting). You may have this done every 1-3 years.  Abdominal aortic aneurysm (AAA) screening. You may need this if you are a current or former smoker.  Osteoporosis. You may be screened starting at age 74 if you are at high risk. Talk with your health care provider about your test results, treatment options, and if necessary, the  need for more tests. Vaccines  Your health care provider may recommend certain vaccines, such as:  Influenza vaccine. This is recommended every year.  Tetanus, diphtheria, and acellular pertussis (Tdap, Td) vaccine. You may need a Td booster every 10 years.  Zoster vaccine. You may need this after age  25.  Pneumococcal 13-valent conjugate (PCV13) vaccine. One dose is recommended after age 61.  Pneumococcal polysaccharide (PPSV23) vaccine. One dose is recommended after age 7. Talk to your health care provider about which screenings and vaccines you need and how often you need them. This information is not intended to replace advice given to you by your health care provider. Make sure you discuss any questions you have with your health care provider. Document Released: 08/10/2015 Document Revised: 04/02/2016 Document Reviewed: 05/15/2015 Elsevier Interactive Patient Education  2017 [employer] Prevention in the Home Falls can cause injuries. They can happen to people of all ages. There are many things you can do to make your home safe and to help prevent falls. What can I do on the outside of my home?  Regularly fix the edges of walkways and driveways and fix any cracks.  Remove anything that might make you trip as you walk through a door, such as a raised step or threshold.  Trim any bushes or trees on the path to your home.  Use bright outdoor lighting.  Clear any walking paths of anything that might make someone trip, such as rocks or tools.  Regularly check to see if handrails are loose or broken. Make sure that both sides of any steps have handrails.  Any raised decks and porches should have guardrails on the edges.  Have any leaves, snow, or ice cleared regularly.  Use sand or salt on walking paths during winter.  Clean up any spills in your garage right away. This includes oil or grease spills. What can I do in the bathroom?  Use night lights.  Install grab bars by the toilet and in the tub and shower. Do not use towel bars as grab bars.  Use non-skid mats or decals in the tub or shower.  If you need to sit down in the shower, use a plastic, non-slip stool.  Keep the floor dry. Clean up any water that spills on the floor as soon as it happens.  Remove  soap buildup in the tub or shower regularly.  Attach bath mats securely with double-sided non-slip rug tape.  Do not have throw rugs and other things on the floor that can make you trip. What can I do in the bedroom?  Use night lights.  Make sure that you have a light by your bed that is easy to reach.  Do not use any sheets or blankets that are too big for your bed. They should not hang down onto the floor.  Have a firm chair that has side arms. You can use this for support while you get dressed.  Do not have throw rugs and other things on the floor that can make you trip. What can I do in the kitchen?  Clean up any spills right away.  Avoid walking on wet floors.  Keep items that you use a lot in easy-to-reach places.  If you need to reach something above you, use a strong step stool that has a grab bar.  Keep electrical cords out of the way.  Do not use floor polish or wax that makes floors slippery. If you must use wax,  use non-skid floor wax.  Do not have throw rugs and other things on the floor that can make you trip. What can I do with my stairs?  Do not leave any items on the stairs.  Make sure that there are handrails on both sides of the stairs and use them. Fix handrails that are broken or loose. Make sure that handrails are as long as the stairways.  Check any carpeting to make sure that it is firmly attached to the stairs. Fix any carpet that is loose or worn.  Avoid having throw rugs at the top or bottom of the stairs. If you do have throw rugs, attach them to the floor with carpet tape.  Make sure that you have a light switch at the top of the stairs and the bottom of the stairs. If you do not have them, ask someone to add them for you. What else can I do to help prevent falls?  Wear shoes that:  Do not have high heels.  Have rubber bottoms.  Are comfortable and fit you well.  Are closed at the toe. Do not wear sandals.  If you use a  stepladder:  Make sure that it is fully opened. Do not climb a closed stepladder.  Make sure that both sides of the stepladder are locked into place.  Ask someone to hold it for you, if possible.  Clearly mark and make sure that you can see:  Any grab bars or handrails.  First and last steps.  Where the edge of each step is.  Use tools that help you move around (mobility aids) if they are needed. These include:  Canes.  Walkers.  Scooters.  Crutches.  Turn on the lights when you go into a dark area. Replace any light bulbs as soon as they burn out.  Set up your furniture so you have a clear path. Avoid moving your furniture around.  If any of your floors are uneven, fix them.  If there are any pets around you, be aware of where they are.  Review your medicines with your doctor. Some medicines can make you feel dizzy. This can increase your chance of falling. Ask your doctor what other things that you can do to help prevent falls. This information is not intended to replace advice given to you by your health care provider. Make sure you discuss any questions you have with your health care provider. Document Released: 05/10/2009 Document Revised: 12/20/2015 Document Reviewed: 08/18/2014 Elsevier Interactive Patient Education  2017 [employer].

## 2020-12-13 ENCOUNTER — Emergency Department (HOSPITAL_BASED_OUTPATIENT_CLINIC_OR_DEPARTMENT_OTHER)
Admission: EM | Admit: 2020-12-13 | Discharge: 2020-12-13 | Disposition: A | Discharge status: Home / Self Care | Payer: PPO | Attending: Emergency Medicine | Admitting: Emergency Medicine

## 2020-12-13 ENCOUNTER — Other Ambulatory Visit: Payer: Self-pay

## 2020-12-13 ENCOUNTER — Emergency Department (HOSPITAL_BASED_OUTPATIENT_CLINIC_OR_DEPARTMENT_OTHER): Payer: PPO

## 2020-12-13 ENCOUNTER — Encounter (HOSPITAL_BASED_OUTPATIENT_CLINIC_OR_DEPARTMENT_OTHER): Payer: Self-pay | Admitting: Emergency Medicine

## 2020-12-13 DIAGNOSIS — K8020 Calculus of gallbladder without cholecystitis without obstruction: Secondary | ICD-10-CM | POA: Diagnosis not present

## 2020-12-13 DIAGNOSIS — Z79899 Other long term (current) drug therapy: Secondary | ICD-10-CM | POA: Insufficient documentation

## 2020-12-13 DIAGNOSIS — R1032 Left lower quadrant pain: Secondary | ICD-10-CM | POA: Diagnosis not present

## 2020-12-13 DIAGNOSIS — Z7982 Long term (current) use of aspirin: Secondary | ICD-10-CM | POA: Diagnosis not present

## 2020-12-13 DIAGNOSIS — R197 Diarrhea, unspecified: Secondary | ICD-10-CM | POA: Diagnosis not present

## 2020-12-13 DIAGNOSIS — I739 Peripheral vascular disease, unspecified: Secondary | ICD-10-CM | POA: Diagnosis not present

## 2020-12-13 DIAGNOSIS — I708 Atherosclerosis of other arteries: Secondary | ICD-10-CM | POA: Diagnosis not present

## 2020-12-13 DIAGNOSIS — K449 Diaphragmatic hernia without obstruction or gangrene: Secondary | ICD-10-CM | POA: Diagnosis not present

## 2020-12-13 DIAGNOSIS — I10 Essential (primary) hypertension: Secondary | ICD-10-CM | POA: Insufficient documentation

## 2020-12-13 LAB — URINALYSIS, ROUTINE W REFLEX MICROSCOPIC
Bilirubin Urine: NEGATIVE
Glucose, UA: NEGATIVE mg/dL
Hgb urine dipstick: NEGATIVE
Ketones, ur: NEGATIVE mg/dL
Leukocytes,Ua: NEGATIVE
Nitrite: NEGATIVE
Protein, ur: NEGATIVE mg/dL
Specific Gravity, Urine: 1.02 (ref 1.005–1.030)
pH: 6 (ref 5.0–8.0)

## 2020-12-13 LAB — COMPREHENSIVE METABOLIC PANEL
ALT: 19 U/L (ref 0–44)
AST: 17 U/L (ref 15–41)
Albumin: 4.4 g/dL (ref 3.5–5.0)
Alkaline Phosphatase: 63 U/L (ref 38–126)
Anion gap: 7 (ref 5–15)
BUN: 25 mg/dL — ABNORMAL HIGH (ref 8–23)
CO2: 26 mmol/L (ref 22–32)
Calcium: 9.6 mg/dL (ref 8.9–10.3)
Chloride: 105 mmol/L (ref 98–111)
Creatinine, Ser: 1.06 mg/dL (ref 0.61–1.24)
GFR, Estimated: >60 mL/min (ref >60)
Glucose, Bld: 114 mg/dL — ABNORMAL HIGH (ref 70–99)
Potassium: 3.8 mmol/L (ref 3.5–5.1)
Sodium: 138 mmol/L (ref 135–145)
Total Bilirubin: 0.8 mg/dL (ref 0.3–1.2)
Total Protein: 6.9 g/dL (ref 6.5–8.1)

## 2020-12-13 LAB — CBC WITH DIFFERENTIAL/PLATELET
Abs Immature Granulocytes: 0.01 10*3/uL (ref 0.00–0.07)
Basophils Absolute: 0 10*3/uL (ref 0.0–0.1)
Basophils Relative: 0 %
Eosinophils Absolute: 0.1 10*3/uL (ref 0.0–0.5)
Eosinophils Relative: 2 %
HCT: 48 % (ref 39.0–52.0)
Hemoglobin: 16.6 g/dL (ref 13.0–17.0)
Immature Granulocytes: 0 %
Lymphocytes Relative: 19 %
Lymphs Abs: 0.9 10*3/uL (ref 0.7–4.0)
MCH: 30.1 pg (ref 26.0–34.0)
MCHC: 34.6 g/dL (ref 30.0–36.0)
MCV: 87 fL (ref 80.0–100.0)
Monocytes Absolute: 0.4 10*3/uL (ref 0.1–1.0)
Monocytes Relative: 8 %
Neutro Abs: 3.2 10*3/uL (ref 1.7–7.7)
Neutrophils Relative %: 71 %
Platelets: 130 10*3/uL — ABNORMAL LOW (ref 150–400)
RBC: 5.52 MIL/uL (ref 4.22–5.81)
RDW: 14.4 % (ref 11.5–15.5)
WBC: 4.6 10*3/uL (ref 4.0–10.5)
nRBC: 0 % (ref 0.0–0.2)

## 2020-12-13 NOTE — ED Provider Notes (Signed)
[facility] EMERGENCY DEPARTMENT Provider Note   CSN: [id number] Arrival date & time: 12/13/20  1607     History Chief Complaint  Patient presents with  . Abdominal Pain    Jermaine Brady is a 70 y.o. male.  The history is provided by the patient and medical records.  Abdominal Pain  Jermaine Brady is a 70 y.o. male who presents to the Emergency Department complaining of abdominal pain. He presents the emergency department complaining of left lower quadrant abdominal pain that started two days ago. Pain is sharp in nature and radiates around to his back bilaterally. Today he developed associated diarrhea, no melena or hematochezia. No associated fevers, nausea, vomiting, dysuria. He does have a history of diverticulosis with perforation about 20 years ago. Also has a history of kidney stones.    Past Medical History:  Diagnosis Date  . ALLERGIC RHINITIS 10/23/2008  . Arthritis    "neck" (05/19/2016)  . DIVERTICULOSIS, COLON 10/23/2008  . Heart murmur   . Hepatitis C    "don't know when I contracted this; went thru treatment in ~ 2007-2008" ="cured per pt"  . History of blood transfusion 2008?   "during tx to cure my Hep C"  . History of kidney stones   . HYPERTENSION 10/23/2008    Patient Active Problem List   Diagnosis Date Noted  . BPH (benign prostatic hyperplasia) 11/10/2018  . Mixed conductive and sensorineural hearing loss of left ear with restricted hearing of right ear 01/03/2018  . Non-recurrent acute serous otitis media of left ear 12/29/2017  . Sensorineural hearing loss (SNHL) of both ears 12/29/2017  . Viral upper respiratory tract infection 12/29/2017  . Enlarged prostate 12/19/2017  . Seasonal allergies 12/19/2017  . Chest pain 05/19/2016  . Hand injury 04/09/2013  . Hepatitis C 11/06/2010  . CELLULITIS, HAND, LEFT 01/07/2010  . Essential hypertension 10/23/2008  . ALLERGIC RHINITIS 10/23/2008  . DIVERTICULOSIS, COLON 10/23/2008  .  COLONIC POLYPS, HX OF 10/23/2008    Past Surgical History:  Procedure Laterality Date  . ABDOMINAL HERNIA REPAIR  2005  . ASD REPAIR  1971  . ASD REPAIR  age 50  . CARDIAC CATHETERIZATION  1960s - 1970s X 2   "at [facility]"  . COLON SURGERY  2007   ,resection, diverticular rupture  . COLONOSCOPY  06/12/2015  . HERNIA REPAIR  2005  . POLYPECTOMY    . SHOULDER ARTHROSCOPY W/ ROTATOR CUFF REPAIR Right 2014       Family History  Problem Relation Age of Onset  . Alcohol abuse Other   . Heart disease Other   . Alcohol abuse Father   . Colon cancer Neg Hx   . Colon polyps Neg Hx   . Esophageal cancer Neg Hx   . Rectal cancer Neg Hx   . Stomach cancer Neg Hx     Social History   Tobacco Use  . Smoking status: Never Smoker  . Smokeless tobacco: Never Used  Vaping Use  . Vaping Use: Never used  Substance Use Topics  . Alcohol use: Yes    Alcohol/week: 2.0 standard drinks    Types: 2 Cans of beer per week  . Drug use: No    Home Medications Prior to Admission medications   Medication Sig Start Date End Date Taking? Authorizing Provider  aspirin EC 81 MG tablet Take 81 mg by mouth at bedtime.    [provider]  azelastine (ASTELIN) 0.1 % nasal spray Place 2 sprays  into both nostrils 2 (two) times daily. Use in each nostril as directed 07/02/16   Burchette, Alinda Sierras, MD  Cholecalciferol (VITAMIN D3) 1000 UNITS CAPS Take 1,000 Units by mouth at bedtime.     [provider]  fexofenadine (ALLEGRA) 180 MG tablet Take 180 mg by mouth daily as needed for allergies.    [provider]  latanoprost (XALATAN) 0.005 % ophthalmic solution 1 drop daily. 02/11/20   [provider]  lisinopril-hydrochlorothiazide (ZESTORETIC) 20-12.5 MG tablet Take 1 tablet by mouth daily. 06/13/20   Burchette, Alinda Sierras, MD  naproxen sodium (ALEVE) 220 MG tablet Take 440 mg by mouth 2 (two) times daily as needed (pain/headache).    [provider]  sildenafil  (REVATIO) 20 MG tablet Take 40 mg by mouth daily as needed (erectile dysfunction).    [provider]  tamsulosin (FLOMAX) 0.4 MG CAPS capsule Take 0.4 mg by mouth at bedtime.    [provider]    Allergies    Patient has no known allergies.  Review of Systems   Review of Systems  Gastrointestinal: Positive for abdominal pain.  All other systems reviewed and are negative.   Physical Exam Updated Vital Signs BP 132/64 (BP Location: Right Arm)   Pulse (!) 58   Temp 97.7 F (36.5 C) (Oral)   Resp 18   Ht 6\' 1"  (1.854 m)   Wt 86.2 kg   SpO2 98%   BMI 25.07 kg/m   Physical Exam Vitals and nursing note reviewed.  Constitutional:      Appearance: He is well-developed.  HENT:     Head: Normocephalic and atraumatic.  Cardiovascular:     Rate and Rhythm: Normal rate and regular rhythm.     Heart sounds: No murmur heard.   Pulmonary:     Effort: Pulmonary effort is normal. No respiratory distress.     Breath sounds: Normal breath sounds.  Abdominal:     Palpations: Abdomen is soft.     Tenderness: There is no guarding or rebound.     Comments: Mild left lower quadrant tenderness  Musculoskeletal:        General: No swelling or tenderness.  Skin:    General: Skin is warm and dry.  Neurological:     Mental Status: He is alert and oriented to person, place, and time.  Psychiatric:        Behavior: Behavior normal.     ED Results / Procedures / Treatments   Labs (all labs ordered are listed, but only abnormal results are displayed) Labs Reviewed  COMPREHENSIVE METABOLIC PANEL - Abnormal; Notable for the following components:      Result Value   Glucose, Bld 114 (*)    BUN 25 (*)    All other components within normal limits  CBC WITH DIFFERENTIAL/PLATELET - Abnormal; Notable for the following components:   Platelets 130 (*)    All other components within normal limits  URINALYSIS, ROUTINE W REFLEX MICROSCOPIC    EKG None  Radiology CT  Abdomen Pelvis Wo Contrast  Result Date: 12/13/2020 CLINICAL DATA:  Lower abdominal pain EXAM: CT ABDOMEN AND PELVIS WITHOUT CONTRAST TECHNIQUE: Multidetector CT imaging of the abdomen and pelvis was performed following the standard protocol without oral or IV contrast. COMPARISON:  October 31, 2015 FINDINGS: Lower chest: Lung bases are clear.  There is a small hiatal hernia. Hepatobiliary: No focal liver lesions evident on this noncontrast enhanced study. There is a nitrogen containing gallstone within the gallbladder  measuring 8 mm. There is no appreciable gallbladder wall thickening. There is no appreciable biliary duct dilatation. Pancreas: There is no appreciable pancreatic mass or inflammatory focus. Spleen: No splenic lesions are evident. Spleen measures 13.9 x 8.0 x 8.6 cm with a measured splenic volume of 476 cubic cm. Adrenals/Urinary Tract: Adrenals bilaterally appear normal. There is again noted a right renal parapelvic cyst measuring 4.0 x 2.5 cm. There is a parapelvic cyst anteriorly on the left measuring 1.8 x 1.4 cm. A parapelvic cyst in the mid left kidney measures 2.3 x 1.5 cm. No appreciable hydronephrosis on either side. There are stable peripheral renal arterial vascular calcifications bilaterally. No evident renal calculus on either side. No ureteral calculus on either side. Urinary bladder is midline with wall thickness within normal limits. Stomach/Bowel: There are occasional descending colonic and proximal sigmoid diverticula without demonstrable diverticulitis. There is no appreciable bowel wall or mesenteric thickening. No evident bowel obstruction. Terminal ileum appears unremarkable. No periappendiceal region inflammation. No evident free air or portal venous air. Vascular/Lymphatic: There is no abdominal aortic aneurysm. There is aortic atherosclerosis. No evident adenopathy in the abdomen or pelvis. Reproductive: There are prostatic calculi. Prostate and seminal vesicles appear overall  normal in size and contour. Clips noted slightly superior to each scrotum. Other: No evident ascites or abscess in the abdomen or pelvis. Evidence of previous ventral hernia repair with mesh in place. No recurrent hernia evident. Musculoskeletal: There is degenerative change in the lower thoracic and lumbar spine regions. No blastic or lytic bone lesions. No intramuscular lesions evident. IMPRESSION: 1. Occasional colonic diverticula without diverticulitis. No bowel wall thickening or bowel obstruction. No abscess in the abdomen or pelvis. 2.  Cholelithiasis. 3. Prominent spleen. No focal splenic lesions evident on noncontrast enhanced study. 4.  Small hiatal hernia. 5.  Aortic Atherosclerosis (ICD10-I70.0). Electronically Signed   By: Lowella Grip III M.D.   On: 12/13/2020 10:49    Procedures Procedures   Medications Ordered in ED Medications - No data to display  ED Course  I have reviewed the triage vital signs and the nursing notes.  Pertinent labs & imaging results that were available during my care of the patient were reviewed by me and considered in my medical decision making (see chart for details).    MDM Rules/Calculators/A&P                         patient here for evaluation of left lower quadrant abdominal pain, diarrhea. He has mild tenderness on examination without peritoneal findings. Labs and imaging are reassuring. UA not consistent with UTI. CT scan without evidence of obstructing stone or diverticulitis. Discussed with patient findings of studies and recommended home care with rest, Tylenol as needed with outpatient follow-up and return precautions.  Patient was evaluated in the middle of a nationwide CT IV contrast shortage and imaging was obtained without IV contrast in the setting of this shortage.   Final Clinical Impression(s) / ED Diagnoses Final diagnoses:  Left lower quadrant abdominal pain    Rx / DC Orders ED Discharge Orders    None       Quintella Reichert, MD 12/13/20 1257

## 2020-12-13 NOTE — ED Triage Notes (Signed)
Pt states abdominal pain since Tuesday.  Took ibuprofen yesterday with some relief.  Pain in LLQ wrapping to back

## 2020-12-14 ENCOUNTER — Ambulatory Visit: Payer: PPO | Admitting: Family Medicine

## 2020-12-18 ENCOUNTER — Ambulatory Visit (INDEPENDENT_AMBULATORY_CARE_PROVIDER_SITE_OTHER): Payer: PPO | Admitting: Family Medicine

## 2020-12-18 ENCOUNTER — Other Ambulatory Visit: Payer: Self-pay

## 2020-12-18 ENCOUNTER — Encounter: Payer: Self-pay | Admitting: Family Medicine

## 2020-12-18 VITALS — BP 130/70 | HR 72 | Temp 97.8°F | Wt 193.5 lb

## 2020-12-18 DIAGNOSIS — R1032 Left lower quadrant pain: Secondary | ICD-10-CM | POA: Diagnosis not present

## 2020-12-18 DIAGNOSIS — R739 Hyperglycemia, unspecified: Secondary | ICD-10-CM

## 2020-12-18 LAB — POCT GLYCOSYLATED HEMOGLOBIN (HGB A1C): Hemoglobin A1C: 5.1 % (ref 4.0–5.6)

## 2020-12-18 NOTE — Progress Notes (Signed)
Established Patient Office Visit  Subjective:  Patient ID: Jermaine Brady, male    DOB: February 06, 1951  Age: 70 y.o. MRN: [id number]  CC:  Chief Complaint  Patient presents with  . Abdominal Pain    Lower abdominal and back pain, x 1 week, not painful just uncomfortable     HPI Jermaine Brady presents for some continued mild discomfort left lower quadrant abdomen.  He does have remote history of partial colon resection secondary to diverticulum rupture back in 2007.  His last colonoscopy was 2/20.  He presented to the ER last Thursday with left lower quadrant abdominal pain.  Urinalysis unremarkable.  CBC revealed chronic low platelets but normal white count.  Normal hemoglobin.  Chemistries were unremarkable except for mildly elevated glucose in the prediabetic range.  Has had some other prediabetic range blood sugars in the past.  His current pain is somewhat dull and 2-3 out of 10 in severity.  Improved with ibuprofen.  Urinalysis did not show any blood.  CT scan showed no evidence for any stones or diverticulitis.  No obvious hernia.  Current pain radiates slightly toward the back and somewhat bilateral but mostly left lower quadrant.  No recent stool changes.  No constipation.  No nausea or vomiting.  No diarrhea.  Past Medical History:  Diagnosis Date  . ALLERGIC RHINITIS 10/23/2008  . Arthritis    "neck" (05/19/2016)  . DIVERTICULOSIS, COLON 10/23/2008  . Heart murmur   . Hepatitis C    "don't know when I contracted this; went thru treatment in ~ 2007-2008" ="cured per pt"  . History of blood transfusion 2008?   "during tx to cure my Hep C"  . History of kidney stones   . HYPERTENSION 10/23/2008    Past Surgical History:  Procedure Laterality Date  . ABDOMINAL HERNIA REPAIR  2005  . ASD REPAIR  1971  . ASD REPAIR  age 73  . CARDIAC CATHETERIZATION  1960s - 1970s X 2   "at [facility]"  . COLON SURGERY  2007   ,resection, diverticular rupture  . COLONOSCOPY  06/12/2015  . HERNIA  REPAIR  2005  . POLYPECTOMY    . SHOULDER ARTHROSCOPY W/ ROTATOR CUFF REPAIR Right 2014    Family History  Problem Relation Age of Onset  . Alcohol abuse Other   . Heart disease Other   . Alcohol abuse Father   . Colon cancer Neg Hx   . Colon polyps Neg Hx   . Esophageal cancer Neg Hx   . Rectal cancer Neg Hx   . Stomach cancer Neg Hx     Social History   Socioeconomic History  . Marital status: Married    Spouse name: Not on file  . Number of children: Not on file  . Years of education: Not on file  . Highest education level: Not on file  Occupational History  . Not on file  Tobacco Use  . Smoking status: Never Smoker  . Smokeless tobacco: Never Used  Vaping Use  . Vaping Use: Never used  Substance and Sexual Activity  . Alcohol use: Yes    Alcohol/week: 2.0 standard drinks    Types: 2 Cans of beer per week  . Drug use: No  . Sexual activity: Yes  Other Topics Concern  . Not on file  Social History Narrative  . Not on file   Social Determinants of Health   Financial Resource Strain: Low Risk   . Difficulty of Paying Living Expenses:  Not hard at all  Food Insecurity: No Food Insecurity  . Worried About Charity fundraiser in the Last Year: Never true  . Ran Out of Food in the Last Year: Never true  Transportation Needs: No Transportation Needs  . Lack of Transportation (Medical): No  . Lack of Transportation (Non-Medical): No  Physical Activity: Sufficiently Active  . Days of Exercise per Week: 7 days  . Minutes of Exercise per Session: 30 min  Stress: No Stress Concern Present  . Feeling of Stress : Not at all  Social Connections: Moderately Integrated  . Frequency of Communication with Friends and Family: More than three times a week  . Frequency of Social Gatherings with Friends and Family: More than three times a week  . Attends Religious Services: 1 to 4 times per year  . Active Member of Clubs or Organizations: No  . Attends Archivist  Meetings: Never  . Marital Status: Married  Human resources officer Violence: Not At Risk  . Fear of Current or Ex-Partner: No  . Emotionally Abused: No  . Physically Abused: No  . Sexually Abused: No    Outpatient Medications Prior to Visit  Medication Sig Dispense Refill  . aspirin EC 81 MG tablet Take 81 mg by mouth at bedtime.    . Cholecalciferol (VITAMIN D3) 1000 UNITS CAPS Take 1,000 Units by mouth at bedtime.     . fexofenadine (ALLEGRA) 180 MG tablet Take 180 mg by mouth daily as needed for allergies.    Marland Kitchen latanoprost (XALATAN) 0.005 % ophthalmic solution 1 drop daily.    Marland Kitchen lisinopril-hydrochlorothiazide (ZESTORETIC) 20-12.5 MG tablet Take 1 tablet by mouth daily. 90 tablet 3  . naproxen sodium (ALEVE) 220 MG tablet Take 440 mg by mouth 2 (two) times daily as needed (pain/headache).    . sildenafil (REVATIO) 20 MG tablet Take 40 mg by mouth daily as needed (erectile dysfunction).    . tamsulosin (FLOMAX) 0.4 MG CAPS capsule Take 0.4 mg by mouth at bedtime.    Marland Kitchen azelastine (ASTELIN) 0.1 % nasal spray Place 2 sprays into both nostrils 2 (two) times daily. Use in each nostril as directed 30 mL 12   No facility-administered medications prior to visit.    No Known Allergies  ROS Review of Systems  Constitutional: Negative for chills and fever.  Respiratory: Negative for cough and shortness of breath.   Cardiovascular: Negative for chest pain.  Gastrointestinal: Positive for abdominal pain. Negative for abdominal distention, blood in stool, constipation, diarrhea, nausea and vomiting.  Genitourinary: Negative for dysuria.      Objective:    Physical Exam Vitals reviewed.  Constitutional:      Appearance: He is well-developed.  Abdominal:     General: Bowel sounds are normal.     Palpations: Abdomen is soft. There is no mass.     Tenderness: There is abdominal tenderness.     Hernia: No hernia is present.     Comments: Mildly tender to palpation left lower quadrant.  No  guarding or rebound.  Genitourinary:    Testes:        Right: Swelling not present.        Left: Swelling not present.  Neurological:     Mental Status: He is alert.     BP 130/70 (BP Location: Left Arm, Patient Position: Sitting, Cuff Size: Normal)   Pulse 72   Temp 97.8 F (36.6 C) (Oral)   Wt 193 lb 8 oz (87.8 kg)  SpO2 97%   BMI 25.53 kg/m  Wt Readings from Last 3 Encounters:  12/18/20 193 lb 8 oz (87.8 kg)  12/13/20 190 lb (86.2 kg)  06/13/20 189 lb (85.7 kg)     Health Maintenance Due  Topic Date Due  . COVID-19 Vaccine (3 - Booster for Pfizer series) 02/28/2020    There are no preventive care reminders to display for this patient.  Lab Results  Component Value Date   TSH 1.98 06/13/2020   Lab Results  Component Value Date   WBC 4.6 12/13/2020   HGB 16.6 12/13/2020   HCT 48.0 12/13/2020   MCV 87.0 12/13/2020   PLT 130 (L) 12/13/2020   Lab Results  Component Value Date   NA 138 12/13/2020   K 3.8 12/13/2020   CO2 26 12/13/2020   GLUCOSE 114 (H) 12/13/2020   BUN 25 (H) 12/13/2020   CREATININE 1.06 12/13/2020   BILITOT 0.8 12/13/2020   ALKPHOS 63 12/13/2020   AST 17 12/13/2020   ALT 19 12/13/2020   PROT 6.9 12/13/2020   ALBUMIN 4.4 12/13/2020   CALCIUM 9.6 12/13/2020   ANIONGAP 7 12/13/2020   GFR 68.14 06/13/2020   Lab Results  Component Value Date   CHOL 132 06/13/2020   Lab Results  Component Value Date   HDL 29.70 (L) 06/13/2020   Lab Results  Component Value Date   LDLCALC 89 06/13/2020   Lab Results  Component Value Date   TRIG 66.0 06/13/2020   Lab Results  Component Value Date   CHOLHDL 4 06/13/2020   Lab Results  Component Value Date   HGBA1C 5.4 05/19/2016      Assessment & Plan:   #1 left lower quadrant abdominal pain.  Recent ER evaluation reviewed with no evidence of diverticulitis or kidney stone.  Nonacute abdomen.  Current pain does respond to ibuprofen.  Questionable musculoskeletal origin based on  that.  -Recommend continued observation and ibuprofen as necessary -Follow-up immediately for any fever, worsening pain, or any other new symptoms  #2 hyperglycemia by recent labs with prediabetic range blood sugars -Check A1c today-5.1% which is excellent. -Recommend low glycemic diet -Try to lose some visceral fat and recommend regular exercise and needs to be screened at least yearly for diabetes with A1c -His wife is a dietitian which should be very helpful in helping provide additional guidance for him from dietary standpoint  Spent over 30 minutes reviewing recent ER notes and imaging and lab results in addition to assessing issues today and discussing dietary issues and prevention of type 2 diabetes.  No orders of the defined types were placed in this encounter.   Follow-up: No follow-ups on file.    Carolann Littler, MD

## 2020-12-18 NOTE — Patient Instructions (Signed)
Hyperglycemia Hyperglycemia occurs when the level of sugar (glucose) in the blood is too high. Glucose is a type of sugar that provides the body's main source of energy. Certain hormones (insulin and glucagon) control the level of glucose in the blood. Insulin lowers blood glucose, and glucagon increases blood glucose. Hyperglycemia can result from not having enough insulin in the bloodstream, or from the body not responding normally to insulin. Hyperglycemia occurs most often in people who have diabetes (diabetes mellitus), but it can happen in people who do not have diabetes. It can develop quickly, and it can be life-threatening if it causes you to become severely dehydrated (diabetic ketoacidosis or hyperglycemic hyperosmolar state). Severe hyperglycemia is a medical emergency. For most people with diabetes, a blood glucose level above 240 mg/dL is considered hyperglycemia. What are the causes? If you have diabetes, hyperglycemia may be caused by:  Medicines that increase blood glucose or affect your diabetes control.  Getting less physical activity.  Eating more than planned.  Being sick or injured, having an infection, or having surgery.  Stress.  Not giving yourself enough insulin (if you are taking insulin). If you have undiagnosed diabetes, this may be the reason you have hyperglycemia. If you do not have diabetes, hyperglycemia may be caused by:  Certain medicines, including: ? Steroid medicines. ? Beta-blockers. ? Epinephrine. ? Thiazide diuretics.  Stress.  Having a serious illness, an infection, or surgery.  Diseases of the pancreas. What increases the risk? Hyperglycemia is more likely to develop in people who have risk factors for diabetes, such as:  Having a family member with diabetes.  Certain conditions in which the body's disease-fighting system (immune system) attacks itself (autoimmune disorders).  Being overweight or obese.  Having an inactive  (sedentary) lifestyle.  Having been diagnosed with insulin resistance.  Having a history of prediabetes, gestational diabetes, or polycystic ovarian syndrome (PCOS). What are the signs or symptoms? Hyperglycemia may not cause any symptoms. If you do have symptoms, they may include:  Increased thirst.  Needing to urinate more often than usual.  Hunger.  Feeling very tired.  Blurry vision. Other symptoms may develop if hyperglycemia gets worse, such as:  Dry mouth.  Abdominal pain.  Loss of appetite.  Fruity-smelling breath.  Weakness.  Unexpected weight loss.  Tingling or numbness in the hands or feet.  Headache.  Cuts or bruises that are slow to heal. How is this diagnosed? Hyperglycemia is diagnosed with a blood test to measure your blood glucose level. This blood test is usually done while you are having symptoms. Your health care provider may also do a physical exam and review your medical history. You may have more tests to determine the cause of your hyperglycemia, such as:  A fasting blood glucose (FBG) test. You will not be allowed to eat (you will fast) for at least 8 hours before a blood sample is taken.  An A1C blood test. This provides information about blood glucose control over the previous 2-3 months.  An oral glucose tolerance test (OGTT). This measures your blood glucose at two times: ? After fasting. This is your baseline blood glucose level. ? 2 hours after drinking a beverage that contains glucose. How is this treated? Treatment depends on the cause of your hyperglycemia. Treatment may include:  Taking medicine to regulate your blood glucose levels. If you take insulin or other diabetes medicines, your medicine or dosage may be adjusted.  Lifestyle changes, such as exercising more, eating healthier foods, or losing  weight.  Treating an illness or infection.  Checking your blood glucose more often.  Stopping or reducing steroid  medicines. If your hyperglycemia becomes severe and it results in diabetic ketoacidosis or hyperglycemic hyperosmolar state, you must be hospitalized and given IV fluids and IV insulin. Follow these instructions at home: General instructions  Take over-the-counter and prescription medicines only as told by your health care provider.  Do not use any products that contain nicotine or tobacco. These products include cigarettes, chewing tobacco, and vaping devices, such as e-cigarettes. If you need help quitting, ask your health care provider.  If you drink alcohol: ? Limit how much you have to:  0-1 drink a day for women who are not pregnant.  0-2 drinks a day for men. ? Know how much alcohol is in a drink. In the [place] one drink equals one 12 oz bottle of beer (355 mL), one 5 oz glass of wine (148 mL), or one 1 oz glass of hard liquor (44 mL).  Learn to manage stress. If you need help with this, ask your health care provider.  Do exercises as told by your health care provider.  Keep all follow-up visits. This is important. Eating and drinking  Maintain a healthy weight.  Stay hydrated, especially when you exercise, get sick, or spend time in hot temperatures.  Drink enough fluid to keep your urine pale yellow.   If you have diabetes:  Know the symptoms of hyperglycemia.  Follow your diabetes management plan as told by your health care provider. Make sure you: ? Take your insulin and medicines as told. ? Follow your exercise plan. ? Follow your meal plan. Eat on time, and do not skip meals. ? Check your blood glucose as often as told. Make sure to check your blood glucose before and after exercise. If you exercise longer or in a different way, check your blood glucose more often. ? Follow your sick day plan whenever you cannot eat or drink normally. Make this plan in advance with your health care provider.  Share your diabetes management plan with people in your workplace,  school, and household.  Check your urine for ketones when you are ill and as told by your health care provider.  Carry a medical alert card or wear medical alert jewelry.   Where to find more information American Diabetes Association: www.diabetes.org Contact a health care provider if:  Your blood glucose is at or above 240 mg/dL (13.3 mmol/L) for 2 days in a row.  You have problems keeping your blood glucose in your target range.  You have frequent episodes of hyperglycemia.  You have signs of illness, such as nausea, vomiting, or fever. Get help right away if:  Your blood glucose monitor reads "high" even when you are taking insulin.  You have trouble breathing.  You have a change in how you think, feel, or act (mental status).  You have nausea or vomiting that does not go away. These symptoms may represent a serious problem that is an emergency. Do not wait to see if the symptoms will go away. Get medical help right away. Call your local emergency services (911 in the [place] Do not drive yourself to the hospital. Summary  Hyperglycemia occurs when the level of sugar (glucose) in the blood is too high.  Hyperglycemia can happen with or without diabetes, and severe hyperglycemia can be life-threatening.  Hyperglycemia is diagnosed with a blood test to measure your blood glucose level. This blood test  is usually done while you are having symptoms. Your health care provider may also do a physical exam and review your medical history.  If you have diabetes, follow your diabetes management plan as told by your health care provider.  Contact your health care provider if you have problems keeping your blood glucose in your target range. This information is not intended to replace advice given to you by your health care provider. Make sure you discuss any questions you have with your health care provider. Document Revised: 04/27/2020 Document Reviewed: 04/27/2020 Elsevier Patient  Education  2021 [employer].

## 2020-12-31 DIAGNOSIS — H401112 Primary open-angle glaucoma, right eye, moderate stage: Secondary | ICD-10-CM | POA: Diagnosis not present

## 2020-12-31 DIAGNOSIS — Z961 Presence of intraocular lens: Secondary | ICD-10-CM | POA: Diagnosis not present

## 2020-12-31 DIAGNOSIS — H401121 Primary open-angle glaucoma, left eye, mild stage: Secondary | ICD-10-CM | POA: Diagnosis not present

## 2021-01-01 ENCOUNTER — Encounter: Payer: Self-pay | Admitting: Family Medicine

## 2021-01-01 NOTE — Telephone Encounter (Signed)
Please advise. Last Tdap was in 2016

## 2021-01-17 DIAGNOSIS — N403 Nodular prostate with lower urinary tract symptoms: Secondary | ICD-10-CM | POA: Diagnosis not present

## 2021-01-17 DIAGNOSIS — R350 Frequency of micturition: Secondary | ICD-10-CM | POA: Diagnosis not present

## 2021-01-17 DIAGNOSIS — R3915 Urgency of urination: Secondary | ICD-10-CM | POA: Diagnosis not present

## 2021-01-17 DIAGNOSIS — R351 Nocturia: Secondary | ICD-10-CM | POA: Diagnosis not present

## 2021-02-01 DIAGNOSIS — R350 Frequency of micturition: Secondary | ICD-10-CM | POA: Diagnosis not present

## 2021-02-01 DIAGNOSIS — R972 Elevated prostate specific antigen [PSA]: Secondary | ICD-10-CM | POA: Diagnosis not present

## 2021-02-01 DIAGNOSIS — R3915 Urgency of urination: Secondary | ICD-10-CM | POA: Diagnosis not present

## 2021-02-01 DIAGNOSIS — N403 Nodular prostate with lower urinary tract symptoms: Secondary | ICD-10-CM | POA: Diagnosis not present

## 2021-02-01 DIAGNOSIS — R351 Nocturia: Secondary | ICD-10-CM | POA: Diagnosis not present

## 2021-03-07 DIAGNOSIS — R972 Elevated prostate specific antigen [PSA]: Secondary | ICD-10-CM | POA: Diagnosis not present

## 2021-03-14 DIAGNOSIS — R3915 Urgency of urination: Secondary | ICD-10-CM | POA: Diagnosis not present

## 2021-03-14 DIAGNOSIS — N401 Enlarged prostate with lower urinary tract symptoms: Secondary | ICD-10-CM | POA: Diagnosis not present

## 2021-04-30 DIAGNOSIS — H401121 Primary open-angle glaucoma, left eye, mild stage: Secondary | ICD-10-CM | POA: Diagnosis not present

## 2021-04-30 DIAGNOSIS — H401112 Primary open-angle glaucoma, right eye, moderate stage: Secondary | ICD-10-CM | POA: Diagnosis not present

## 2021-04-30 DIAGNOSIS — Z961 Presence of intraocular lens: Secondary | ICD-10-CM | POA: Diagnosis not present

## 2021-05-29 ENCOUNTER — Other Ambulatory Visit: Payer: Self-pay | Admitting: Family Medicine

## 2021-09-16 DIAGNOSIS — N401 Enlarged prostate with lower urinary tract symptoms: Secondary | ICD-10-CM | POA: Diagnosis not present

## 2021-09-19 ENCOUNTER — Ambulatory Visit (INDEPENDENT_AMBULATORY_CARE_PROVIDER_SITE_OTHER): Payer: PPO

## 2021-09-19 VITALS — BP 132/60 | HR 60 | Temp 98.1°F | Ht 72.0 in | Wt 186.6 lb

## 2021-09-19 DIAGNOSIS — Z0000 Encounter for general adult medical examination without abnormal findings: Secondary | ICD-10-CM | POA: Diagnosis not present

## 2021-09-19 NOTE — Patient Instructions (Addendum)
Jermaine Brady , Thank you for taking time to come for your Medicare Wellness Visit. I appreciate your ongoing commitment to your health goals. Please review the following plan we discussed and let me know if I can assist you in the future.   These are the goals we discussed:  Goals       Patient Stated (pt-stated)      I would like to continue maintaining my current health and keep working.        This is a list of the screening recommended for you and due dates:  Health Maintenance  Topic Date Due   COVID-19 Vaccine (4 - Booster for Pfizer series) 07/28/2021   Zoster (Shingles) Vaccine (1 of 2) 12/17/2021*   Colon Cancer Screening  09/19/2022*   Tetanus Vaccine  12/21/2024   Pneumonia Vaccine  Completed   Flu Shot  Completed   Hepatitis C Screening: USPSTF Recommendation to screen - Ages 18-79 yo.  Completed   HPV Vaccine  Aged Out  *Topic was postponed. The date shown is not the original due date.    Advanced directives: Yes Patient will bring Copy  Conditions/risks identified: None  Next appointment: Follow up in one year for your annual wellness visit.   Preventive Care 11 Years and Older, Male Preventive care refers to lifestyle choices and visits with your health care provider that can promote health and wellness. What does preventive care include? A yearly physical exam. This is also called an annual well check. Dental exams once or twice a year. Routine eye exams. Ask your health care provider how often you should have your eyes checked. Personal lifestyle choices, including: Daily care of your teeth and gums. Regular physical activity. Eating a healthy diet. Avoiding tobacco and drug use. Limiting alcohol use. Practicing safe sex. Taking low doses of aspirin every day. Taking vitamin and mineral supplements as recommended by your health care provider. What happens during an annual well check? The services and screenings done by your health care provider during  your annual well check will depend on your age, overall health, lifestyle risk factors, and family history of disease. Counseling  Your health care provider may ask you questions about your: Alcohol use. Tobacco use. Drug use. Emotional well-being. Home and relationship well-being. Sexual activity. Eating habits. History of falls. Memory and ability to understand (cognition). Work and work Statistician. Screening  You may have the following tests or measurements: Height, weight, and BMI. Blood pressure. Lipid and cholesterol levels. These may be checked every 5 years, or more frequently if you are over 42 years old. Skin check. Lung cancer screening. You may have this screening every year starting at age 81 if you have a 30-pack-year history of smoking and currently smoke or have quit within the past 15 years. Fecal occult blood test (FOBT) of the stool. You may have this test every year starting at age 78. Flexible sigmoidoscopy or colonoscopy. You may have a sigmoidoscopy every 5 years or a colonoscopy every 10 years starting at age 64. Prostate cancer screening. Recommendations will vary depending on your family history and other risks. Hepatitis C blood test. Hepatitis B blood test. Sexually transmitted disease (STD) testing. Diabetes screening. This is done by checking your blood sugar (glucose) after you have not eaten for a while (fasting). You may have this done every 1-3 years. Abdominal aortic aneurysm (AAA) screening. You may need this if you are a current or former smoker. Osteoporosis. You may be screened starting at  age 54 if you are at high risk. Talk with your health care provider about your test results, treatment options, and if necessary, the need for more tests. Vaccines  Your health care provider may recommend certain vaccines, such as: Influenza vaccine. This is recommended every year. Tetanus, diphtheria, and acellular pertussis (Tdap, Td) vaccine. You may need  a Td booster every 10 years. Zoster vaccine. You may need this after age 77. Pneumococcal 13-valent conjugate (PCV13) vaccine. One dose is recommended after age 37. Pneumococcal polysaccharide (PPSV23) vaccine. One dose is recommended after age 36. Talk to your health care provider about which screenings and vaccines you need and how often you need them. This information is not intended to replace advice given to you by your health care provider. Make sure you discuss any questions you have with your health care provider. Document Released: 08/10/2015 Document Revised: 04/02/2016 Document Reviewed: 05/15/2015 Elsevier Interactive Patient Education  2017 [facility] Prevention in the Home Falls can cause injuries. They can happen to people of all ages. There are many things you can do to make your home safe and to help prevent falls. What can I do on the outside of my home? Regularly fix the edges of walkways and driveways and fix any cracks. Remove anything that might make you trip as you walk through a door, such as a raised step or threshold. Trim any bushes or trees on the path to your home. Use bright outdoor lighting. Clear any walking paths of anything that might make someone trip, such as rocks or tools. Regularly check to see if handrails are loose or broken. Make sure that both sides of any steps have handrails. Any raised decks and porches should have guardrails on the edges. Have any leaves, snow, or ice cleared regularly. Use sand or salt on walking paths during winter. Clean up any spills in your garage right away. This includes oil or grease spills. What can I do in the bathroom? Use night lights. Install grab bars by the toilet and in the tub and shower. Do not use towel bars as grab bars. Use non-skid mats or decals in the tub or shower. If you need to sit down in the shower, use a plastic, non-slip stool. Keep the floor dry. Clean up any water that spills on the  floor as soon as it happens. Remove soap buildup in the tub or shower regularly. Attach bath mats securely with double-sided non-slip rug tape. Do not have throw rugs and other things on the floor that can make you trip. What can I do in the bedroom? Use night lights. Make sure that you have a light by your bed that is easy to reach. Do not use any sheets or blankets that are too big for your bed. They should not hang down onto the floor. Have a firm chair that has side arms. You can use this for support while you get dressed. Do not have throw rugs and other things on the floor that can make you trip. What can I do in the kitchen? Clean up any spills right away. Avoid walking on wet floors. Keep items that you use a lot in easy-to-reach places. If you need to reach something above you, use a strong step stool that has a grab bar. Keep electrical cords out of the way. Do not use floor polish or wax that makes floors slippery. If you must use wax, use non-skid floor wax. Do not have throw rugs and  other things on the floor that can make you trip. What can I do with my stairs? Do not leave any items on the stairs. Make sure that there are handrails on both sides of the stairs and use them. Fix handrails that are broken or loose. Make sure that handrails are as long as the stairways. Check any carpeting to make sure that it is firmly attached to the stairs. Fix any carpet that is loose or worn. Avoid having throw rugs at the top or bottom of the stairs. If you do have throw rugs, attach them to the floor with carpet tape. Make sure that you have a light switch at the top of the stairs and the bottom of the stairs. If you do not have them, ask someone to add them for you. What else can I do to help prevent falls? Wear shoes that: Do not have high heels. Have rubber bottoms. Are comfortable and fit you well. Are closed at the toe. Do not wear sandals. If you use a stepladder: Make sure that  it is fully opened. Do not climb a closed stepladder. Make sure that both sides of the stepladder are locked into place. Ask someone to hold it for you, if possible. Clearly mark and make sure that you can see: Any grab bars or handrails. First and last steps. Where the edge of each step is. Use tools that help you move around (mobility aids) if they are needed. These include: Canes. Walkers. Scooters. Crutches. Turn on the lights when you go into a dark area. Replace any light bulbs as soon as they burn out. Set up your furniture so you have a clear path. Avoid moving your furniture around. If any of your floors are uneven, fix them. If there are any pets around you, be aware of where they are. Review your medicines with your doctor. Some medicines can make you feel dizzy. This can increase your chance of falling. Ask your doctor what other things that you can do to help prevent falls. This information is not intended to replace advice given to you by your health care provider. Make sure you discuss any questions you have with your health care provider. Document Released: 05/10/2009 Document Revised: 12/20/2015 Document Reviewed: 08/18/2014 Elsevier Interactive Patient Education  2017 [employer].

## 2021-09-19 NOTE — Progress Notes (Signed)
Subjective:   Jermaine Brady is a 71 y.o. male who presents for Medicare Annual/Subsequent preventive examination.  Review of Systems        Objective:    Today's Vitals   09/19/21 0858  BP: 132/60  Pulse: 60  Temp: 98.1 F (36.7 C)  TempSrc: Oral  SpO2: 98%  Weight: 186 lb 9.6 oz (84.6 kg)  Height: 6' (1.829 m)   Body mass index is 25.31 kg/m.  Advanced Directives 09/19/2021 10/09/2020 10/10/2019 05/19/2016 05/29/2015  Does Patient Have a Medical Advance Directive? Yes Yes No No Yes  Type of Paramedic of [place];Living will [place];Living will - - [place];Living will  Does patient want to make changes to medical advance directive? No - Patient declined No - Patient declined - - No - Patient declined  Copy of [place] in Chart? No - copy requested No - copy requested - - No - copy requested  Would patient like information on creating a medical advance directive? - - No - Patient declined No - patient declined information -    Current Medications (verified) Outpatient Encounter Medications as of 09/19/2021  Medication Sig   aspirin EC 81 MG tablet Take 81 mg by mouth at bedtime.   Cholecalciferol (VITAMIN D3) 1000 UNITS CAPS Take 1,000 Units by mouth at bedtime.    fexofenadine (ALLEGRA) 180 MG tablet Take 180 mg by mouth daily as needed for allergies.   latanoprost (XALATAN) 0.005 % ophthalmic solution 1 drop daily.   lisinopril-hydrochlorothiazide (ZESTORETIC) 20-12.5 MG tablet TAKE 1 TABLET BY MOUTH EVERY DAY   naproxen sodium (ALEVE) 220 MG tablet Take 440 mg by mouth 2 (two) times daily as needed (pain/headache).   sildenafil (REVATIO) 20 MG tablet Take 40 mg by mouth daily as needed (erectile dysfunction).   tamsulosin (FLOMAX) 0.4 MG CAPS capsule Take 0.4 mg by mouth at bedtime.   No facility-administered encounter medications on file as of 09/19/2021.    Allergies  (verified) Patient has no known allergies.   History: Past Medical History:  Diagnosis Date   ALLERGIC RHINITIS 10/23/2008   Arthritis    "neck" (05/19/2016)   DIVERTICULOSIS, COLON 10/23/2008   Heart murmur    Hepatitis C    "don't know when I contracted this; went thru treatment in ~ 2007-2008" ="cured per pt"   History of blood transfusion 2008?   "during tx to cure my Hep C"   History of kidney stones    HYPERTENSION 10/23/2008   Past Surgical History:  Procedure Laterality Date   ABDOMINAL HERNIA REPAIR  2005   ASD [place]   ASD REPAIR  age 24   CARDIAC CATHETERIZATION  1960s - 56s X 2   "at [place]"   [place]  2007   ,resection, diverticular rupture   COLONOSCOPY  06/12/2015   HERNIA REPAIR  2005   POLYPECTOMY     SHOULDER ARTHROSCOPY W/ ROTATOR CUFF REPAIR Right 2014   Family History  Problem Relation Age of Onset   Alcohol abuse Other    Heart disease Other    Alcohol abuse Father    Colon cancer Neg Hx    Colon polyps Neg Hx    Esophageal cancer Neg Hx    Rectal cancer Neg Hx    Stomach cancer Neg Hx    Social History   Socioeconomic History   Marital status: Married    Spouse name: Not on file  Number of children: Not on file   Years of education: Not on file   Highest education level: Not on file  Occupational History   Not on file  Tobacco Use   Smoking status: Never   Smokeless tobacco: Never  Vaping Use   Vaping Use: Never used  Substance and Sexual Activity   Alcohol use: Yes    Alcohol/week: 2.0 standard drinks    Types: 2 Cans of beer per week   Drug use: No   Sexual activity: Yes  Other Topics Concern   Not on file  Social History Narrative   Not on file   Social Determinants of Health   Financial Resource Strain: Low Risk    Difficulty of Paying Living Expenses: Not hard at all  Food Insecurity: No Food Insecurity   Worried About Charity fundraiser in the Last Year: Never true   [place] in the Last Year:  Never true  Transportation Needs: No Transportation Needs   Lack of Transportation (Medical): No   Lack of Transportation (Non-Medical): No  Physical Activity: Insufficiently Active   Days of Exercise per Week: 7 days   Minutes of Exercise per Session: 20 min  Stress: No Stress Concern Present   Feeling of Stress : Not at all  Social Connections: Moderately Integrated   Frequency of Communication with Friends and Family: More than three times a week   Frequency of Social Gatherings with Friends and Family: Once a week   Attends Religious Services: Never   Marine scientist or Organizations: Yes   Attends Music therapist: 1 to 4 times per year   Marital Status: Married   Clinical Intake:  Pre-visit preparation completed: YesHow often do you need to have someone help you when you read instructions, pamphlets, or other written materials from your doctor or pharmacy?: (P) 1 - Never  Diabetic?  No   Activities of Daily Living In your present state of health, do you have any difficulty performing the following activities: 09/19/2021 09/16/2021  Hearing? N N  Comment - -  Vision? N N  Difficulty concentrating or making decisions? N N  Walking or climbing stairs? N N  Dressing or bathing? N N  Doing errands, shopping? N N  Preparing Food and eating ? N N  Using the Toilet? N N  In the past six months, have you accidently leaked urine? N N  Do you have problems with loss of bowel control? N N  Managing your Medications? N N  Managing your Finances? N N  Housekeeping or managing your Housekeeping? N N  Some recent data might be hidden    Patient Care Team: Eulas Post, MD as PCP - General Belva Crome, MD as PCP - Cardiology (Cardiology)  Indicate any recent Medical Services you may have received from other than [facility] providers in the past year (date may be approximate).     Assessment:   This is a routine wellness examination for  Jermaine Brady.  Hearing/Vision screen Hearing Screening - Comments:: Wears hearing aids Vision Screening - Comments:: Wears glasses. Followed by Dr Katy Fitch  Dietary issues and exercise activities discussed: Exercise limited by: None identified   Goals Addressed               This Visit's Progress     Patient Stated (pt-stated)        I would like to continue maintaining my current health and keep working.  Depression Screen PHQ 2/9 Scores 09/19/2021 10/09/2020 09/02/2019 06/07/2018 06/03/2016  PHQ - 2 Score 0 0 0 0 0  PHQ- 9 Score - - 0 - -    Fall Risk Fall Risk  09/19/2021 09/16/2021 10/09/2020 06/22/2019 02/01/2018  Falls in the past year? 0 0 0 0 No  Comment - - - Emmi Telephone Survey: data to providers prior to load -  Number falls in past yr: 0 0 0 - -  Injury with Fall? 0 0 0 - -  Risk for fall due to : No Fall Risks - No Fall Risks - -  Follow up - - Falls evaluation completed;Falls prevention discussed - -    FALL RISK PREVENTION PERTAINING TO THE HOME:  Any stairs in or around the home? Yes  If so, are there any without handrails? No  Home free of loose throw rugs in walkways, pet beds, electrical cords, etc? Yes  Adequate lighting in your home to reduce risk of falls? Yes   ASSISTIVE DEVICES UTILIZED TO PREVENT FALLS:  Life alert? No  Use of a cane, walker or w/c? No  Grab bars in the bathroom? No  Shower chair or bench in shower? Yes  Elevated toilet seat or a handicapped toilet? Yes   TIMED UP AND GO:  Was the test performed? Yes .  Length of time to ambulate 10 feet: 5 sec.   Gait steady and fast without use of assistive device  Cognitive Function:     6CIT Screen 09/19/2021  What Year? 0 points  What month? 0 points  What time? 0 points  Count back from 20 0 points  Months in reverse 0 points  Repeat phrase 0 points  Total Score 0    Immunizations Immunization History  Administered Date(s) Administered   Fluad Quad(high Dose 65+)  05/09/2019, 05/26/2021   Influenza, High Dose Seasonal PF 08/21/2017, 06/07/2018, 05/13/2020   Influenza,inj,Quad PF,6+ Mos 06/07/2013, 07/31/2014, 05/20/2016   Influenza,trivalent, recombinat, inj, PF 06/05/2014   PFIZER(Purple Top)SARS-COV-2 Vaccination 09/03/2019, 09/28/2019, 06/02/2021   Pneumococcal Conjugate-13 08/21/2017   Pneumococcal Polysaccharide-23 05/20/2016   Td 07/28/2004   Tdap 12/22/2014   Zoster, Live 10/25/2012    TDAP status: Up to date  Flu Vaccine status: Up to date  Pneumococcal vaccine status: Up to date  Covid-19 vaccine status: Completed vaccines  Qualifies for Shingles Vaccine? Yes   Zostavax completed No   Shingrix Completed?: No.    Education has been provided regarding the importance of this vaccine. Patient has been advised to call insurance company to determine out of pocket expense if they have not yet received this vaccine. Advised may also receive vaccine at local pharmacy or Health Dept. Verbalized acceptance and understanding.  Screening Tests Health Maintenance  Topic Date Due   COVID-19 Vaccine (4 - Booster for Pfizer series) 07/28/2021   Zoster Vaccines- Shingrix (1 of 2) 12/17/2021 (Originally 03/18/2000)   COLONOSCOPY (Pts 45-32yrs Insurance coverage will need to be confirmed)  09/19/2022 (Originally 09/03/2021)   TETANUS/TDAP  12/21/2024   Pneumonia Vaccine 31+ Years old  Completed   INFLUENZA VACCINE  Completed   Hepatitis C Screening  Completed   HPV VACCINES  Aged Out    Health Maintenance  Health Maintenance Due  Topic Date Due   COVID-19 Vaccine (4 - Booster for [place] series) 07/28/2021    Colorectal cancer screening: Referral to GI placed 09/19/21. Pt aware the office will call re: appt.  Lung Cancer Screening: (Low Dose CT Chest recommended if  Age 77-80 years, 30 pack-year currently smoking OR have quit w/in 15years.) does not qualify.     Additional Screening:  Hepatitis C Screening: does qualify; Completed  10/27/10  Vision Screening: Recommended annual ophthalmology exams for early detection of glaucoma and other disorders of the eye. Is the patient up to date with their annual eye exam?  Yes  Who is the provider or what is the name of the office in which the patient attends annual eye exams? Dr Katy Fitch If pt is not established with a provider, would they like to be referred to a provider to establish care? No .   Dental Screening: Recommended annual dental exams for proper oral hygiene  Community Resource Referral / Chronic Care Management:  CRR required this visit?  No   CCM required this visit?  No      Plan:     I have personally reviewed and noted the following in the patients chart:   Medical and social history Use of alcohol, tobacco or illicit drugs  Current medications and supplements including opioid prescriptions. Patient is not currently taking opioid prescriptions. Functional ability and status Nutritional status Physical activity Advanced directives List of other physicians Hospitalizations, surgeries, and ER visits in previous 12 months Vitals Screenings to include cognitive, depression, and falls Referrals and appointments  In addition, I have reviewed and discussed with patient certain preventive protocols, quality metrics, and best practice recommendations. A written personalized care plan for preventive services as well as general preventive health recommendations were provided to patient.     Criselda Peaches, LPN   [id number]   Nurse Notes: None

## 2021-09-26 ENCOUNTER — Telehealth: Payer: PPO | Admitting: Physician Assistant

## 2021-09-26 DIAGNOSIS — K529 Noninfective gastroenteritis and colitis, unspecified: Secondary | ICD-10-CM | POA: Diagnosis not present

## 2021-09-26 NOTE — Progress Notes (Signed)
I have spent 5 minutes in review of e-visit questionnaire, review and updating patient chart, medical decision making and response to patient.   Yovanny Coats Cody Mayia Megill, PA-C    

## 2021-09-26 NOTE — Progress Notes (Signed)
Message sent to patient requesting further input regarding current symptoms. Awaiting patient response.  

## 2021-09-26 NOTE — Progress Notes (Signed)
We are sorry that you are not feeling well.  Here is how we plan to help!  Based on what you have shared with me it looks like you have Acute Infectious Diarrhea.  Most cases of acute diarrhea are due to infections with virus and bacteria and are self-limited conditions lasting less than 14 days.  For your symptoms you may take Imodium 2 mg tablets that are over the counter at your local pharmacy. Take two tablet now and then one after each loose stool up to 6 a day.  Antibiotics are not needed for most people with diarrhea.   HOME CARE We recommend changing your diet to help with your symptoms for the next few days. Drink plenty of fluids that contain water salt and sugar. Sports drinks such as Gatorade may help.  You may try broths, soups, bananas, applesauce, soft breads, mashed potatoes or crackers.  You are considered infectious for as long as the diarrhea continues. Hand washing or use of alcohol based hand sanitizers is recommend. It is best to stay out of work or school until your symptoms stop.   GET HELP RIGHT AWAY If you have dark yellow colored urine or do not pass urine frequently you should drink more fluids.   If your symptoms worsen  If you feel like you are going to pass out (faint) You have a new problem  MAKE SURE YOU  Understand these instructions. Will watch your condition. Will get help right away if you are not doing well or get worse.  Thank you for choosing an e-visit.  Your e-visit answers were reviewed by a board certified advanced clinical practitioner to complete your personal care plan. Depending upon the condition, your plan could have included both over the counter or prescription medications.  Please review your pharmacy choice. Make sure the pharmacy is open so you can pick up prescription now. If there is a problem, you may contact your provider through MyChart messaging and have the prescription routed to another pharmacy.  Your safety is important  to us. If you have drug allergies check your prescription carefully.   For the next 24 hours you can use MyChart to ask questions about today's visit, request a non-urgent call back, or ask for a work or school excuse. You will get an email in the next two days asking about your experience. I hope that your e-visit has been valuable and will speed your recovery.  

## 2021-09-30 ENCOUNTER — Ambulatory Visit: Payer: PPO | Admitting: Family Medicine

## 2021-10-09 IMAGING — CT CT ABD-PELV W/O CM
2 of 4 series · 15 of 46 positions shown, 17 images · non-contrast
Comparison: October 31, 2015

CLINICAL DATA: Lower abdominal pain

EXAM:
CT ABDOMEN AND PELVIS WITHOUT CONTRAST
TECHNIQUE: Multidetector CT imaging of the abdomen and pelvis was performed
following the standard protocol without oral or IV contrast.

[Series 2: axial st · axial · 0.92mm/px · z∈[-604,-144]mm · 12 of 109 slices shown, 14 images]
[im 9/109  soft-tissue]
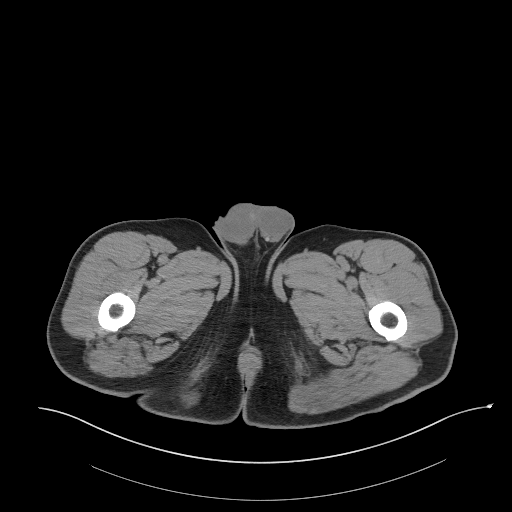
[im 9/109  bone]
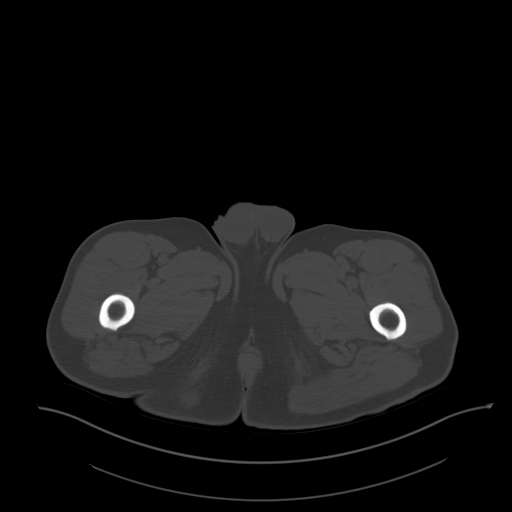
[im 17/109  soft-tissue]
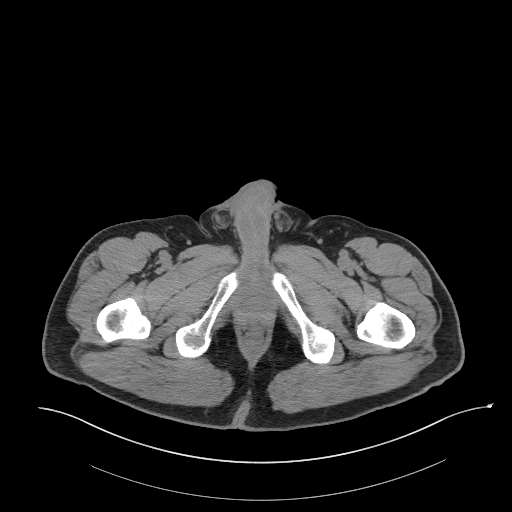
[im 25/109  soft-tissue]
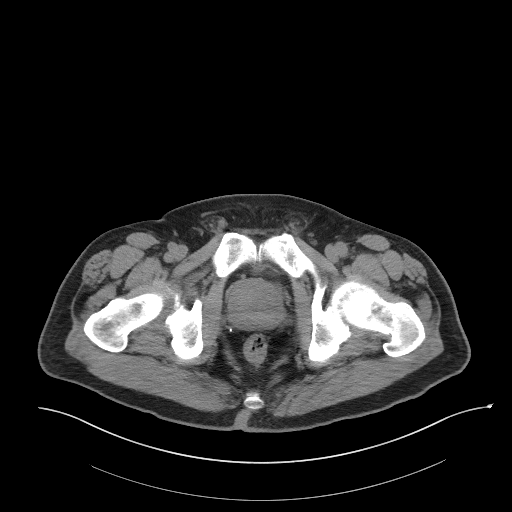
[im 33/109  soft-tissue]
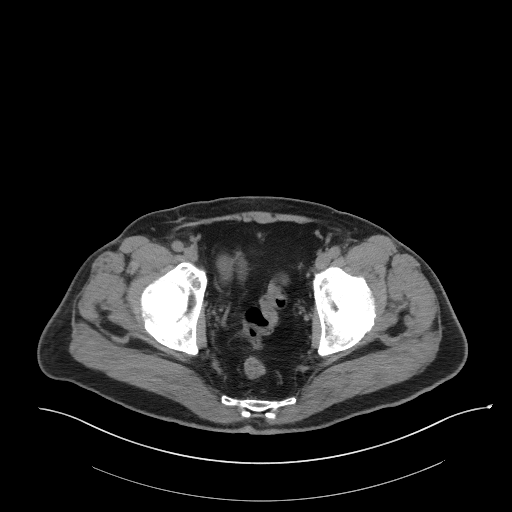
[im 41/109  soft-tissue]
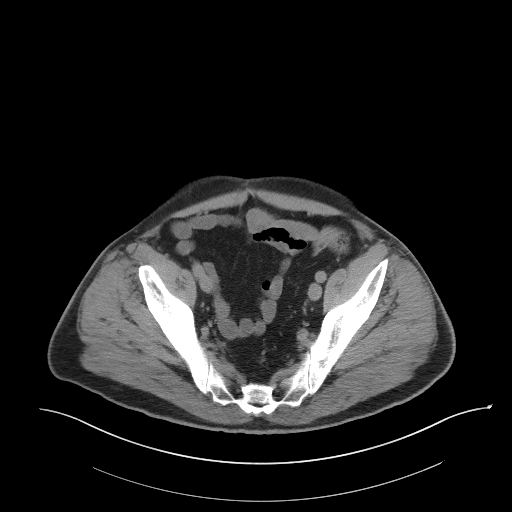
[im 49/109  soft-tissue]
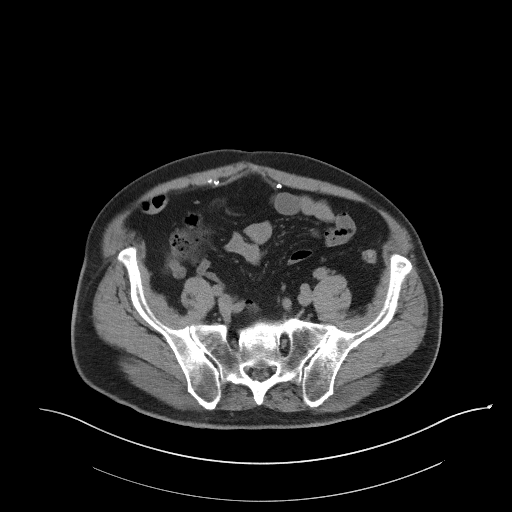
[im 61/109  soft-tissue]
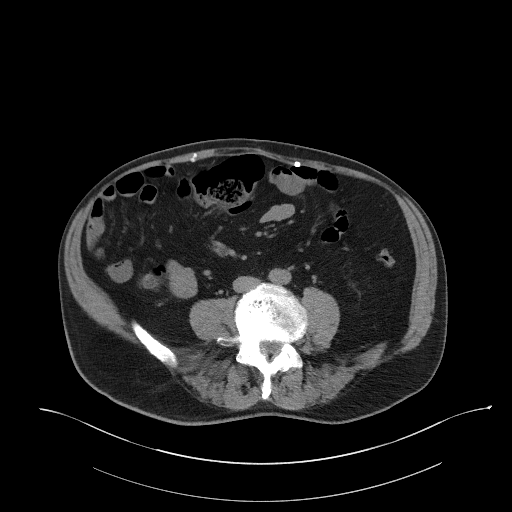
[im 69/109  soft-tissue]
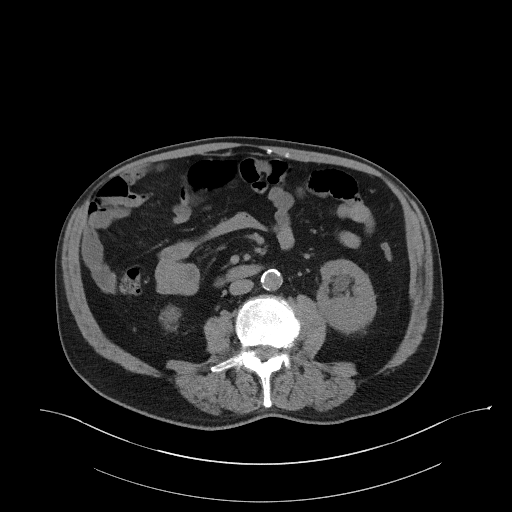
[im 77/109  soft-tissue]
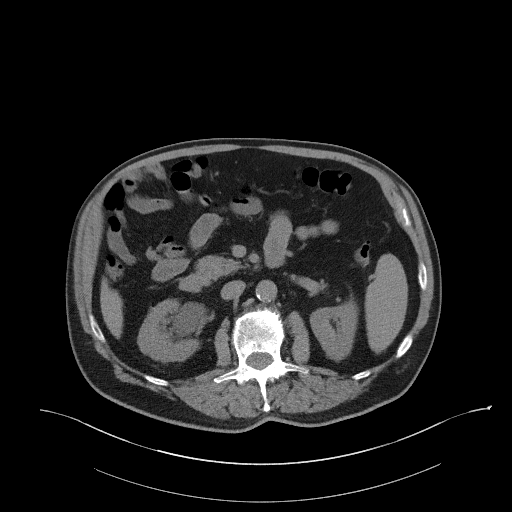
[im 77/109  bone]
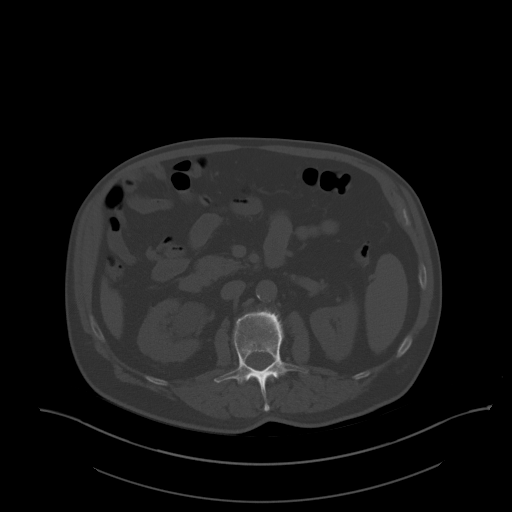
[im 85/109  soft-tissue]
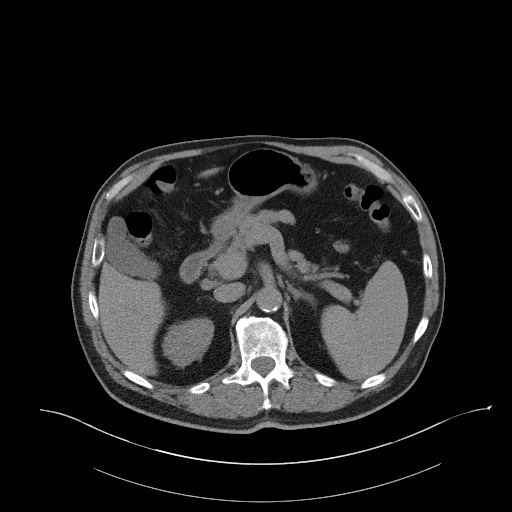
[im 93/109  soft-tissue]
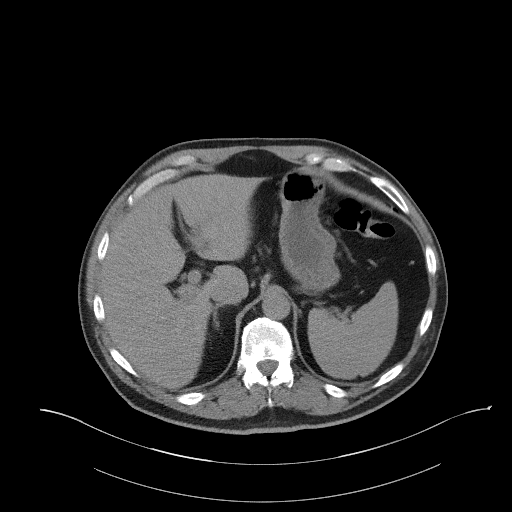
[im 101/109  soft-tissue]
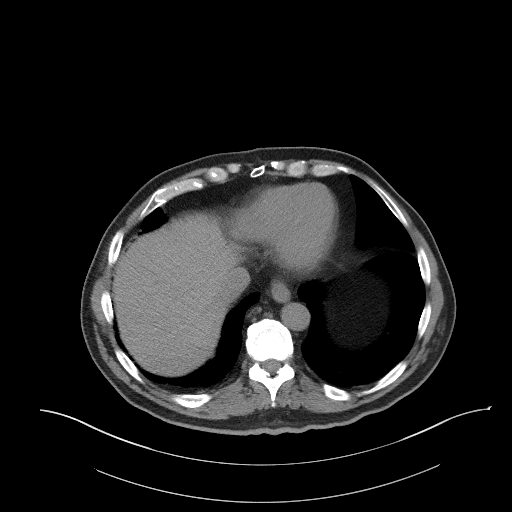

[Series 5: coronal st · coronal · 0.82mm/px · 3 of 93 slices shown]
[im 31/93  soft-tissue]
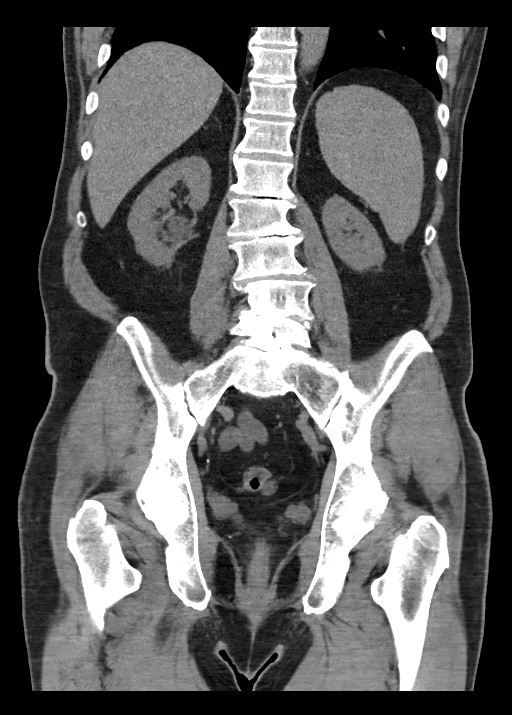
[im 41/93  soft-tissue]
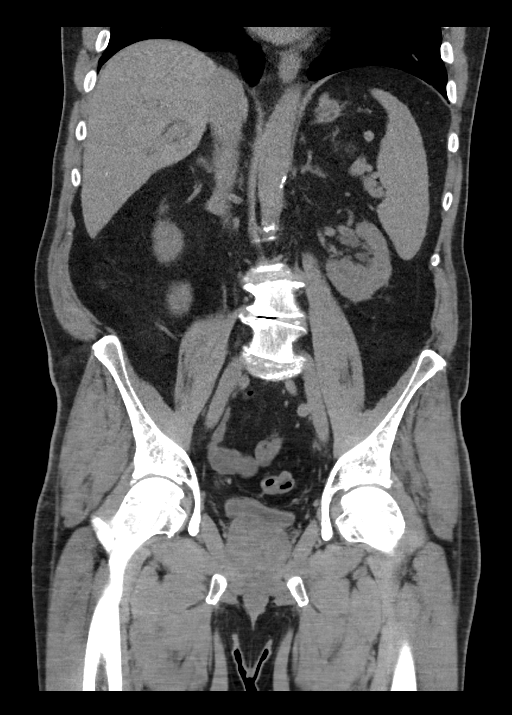
[im 52/93  soft-tissue]
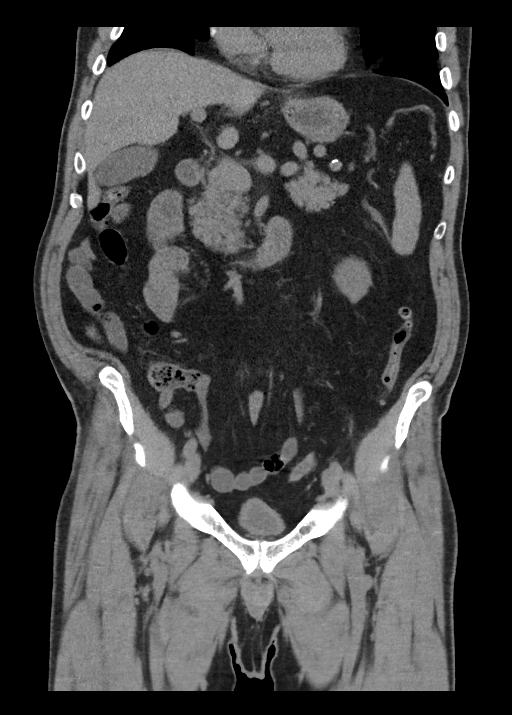

[15 of 46 positions shown; findings below may reference images not displayed]

FINDINGS: Lower chest: Lung bases are clear.  There is a small hiatal hernia.

Hepatobiliary: No focal liver lesions evident on this noncontrast
enhanced study. There is a nitrogen containing gallstone within the
gallbladder measuring 8 mm. There is no appreciable gallbladder wall
thickening. There is no appreciable biliary duct dilatation.

Pancreas: There is no appreciable pancreatic mass or inflammatory
focus.

Spleen: No splenic lesions are evident. Spleen measures 13.9 x 8.0 x
8.6 cm with a measured splenic volume of 476 cubic cm.

Adrenals/Urinary Tract: Adrenals bilaterally appear normal. There is
again noted a right renal parapelvic cyst measuring 4.0 x 2.5 cm.
There is a parapelvic cyst anteriorly on the left measuring 1.8 x
1.4 cm. A parapelvic cyst in the mid left kidney measures 2.3 x
cm. No appreciable hydronephrosis on either side. There are stable
peripheral renal arterial vascular calcifications bilaterally. No
evident renal calculus on either side. No ureteral calculus on
either side. Urinary bladder is midline with wall thickness within
normal limits.

Stomach/Bowel: There are occasional descending colonic and proximal
sigmoid diverticula without demonstrable diverticulitis. There is no
appreciable bowel wall or mesenteric thickening. No evident bowel
obstruction. Terminal ileum appears unremarkable. No periappendiceal
region inflammation. No evident free air or portal venous air.

Vascular/Lymphatic: There is no abdominal aortic aneurysm. There is
aortic atherosclerosis. No evident adenopathy in the abdomen or
pelvis.

Reproductive: There are prostatic calculi. Prostate and seminal
vesicles appear overall normal in size and contour. Clips noted
slightly superior to each scrotum.

Other: No evident ascites or abscess in the abdomen or pelvis.
Evidence of previous ventral hernia repair with mesh in place. No
recurrent hernia evident.

Musculoskeletal: There is degenerative change in the lower thoracic
and lumbar spine regions. No blastic or lytic bone lesions. No
intramuscular lesions evident.
IMPRESSION: 1. Occasional colonic diverticula without diverticulitis. No bowel
wall thickening or bowel obstruction. No abscess in the abdomen or
pelvis.

2.  Cholelithiasis.

3. Prominent spleen. No focal splenic lesions evident on noncontrast
enhanced study.

4.  Small hiatal hernia.

5.  Aortic Atherosclerosis ([id number]-H86.6).

## 2021-10-12 ENCOUNTER — Telehealth: Payer: Self-pay | Admitting: Family Medicine

## 2021-10-12 DIAGNOSIS — U071 COVID-19: Secondary | ICD-10-CM

## 2021-10-12 MED ORDER — MOLNUPIRAVIR EUA 200MG CAPSULE: 4.0000 | ORAL_CAPSULE | Freq: Two times a day (BID) | ORAL | 0 refills | Status: AC

## 2021-10-12 NOTE — Telephone Encounter (Signed)
Access nurse service called reporting that Ms Patras (wife) called reporting that Mr Ericsson tested positive for COVID 19 today. ? ?I called and spoke with Mr Luana Shu. He states that yesterday he started with fatigue chills,frontal pressure headache ("head cold"),nasal congestion,and "little" non productive cough. ?He has taken Tylenol. ?Denies CP,palpitations,SOB,wheezing,abdominal pain,N/V,changes in bowel habits,or skin rash. ? ?He has hx of HTN. ?Recommend continuing Tylenol 500 mg 3-4 times per day as needed, rest.,and hydration. ?Molnupiravid recommended. ?7 days quarantine. ?Clearly instructed about warning signs. ?F/U Monday or Tuesday with Dr Shane Crutch, before if needed. ?He voices understanding and agrees with plan. ?Leiland Mihelich Martin[place], MD ? ?

## 2021-10-15 ENCOUNTER — Encounter: Payer: Self-pay | Admitting: Family Medicine

## 2021-10-15 ENCOUNTER — Other Ambulatory Visit: Payer: Self-pay

## 2021-10-15 ENCOUNTER — Telehealth (INDEPENDENT_AMBULATORY_CARE_PROVIDER_SITE_OTHER): Payer: PPO | Admitting: Family Medicine

## 2021-10-15 VITALS — Ht 72.0 in | Wt 186.0 lb

## 2021-10-15 DIAGNOSIS — U071 COVID-19: Secondary | ICD-10-CM

## 2021-10-15 NOTE — Progress Notes (Signed)
Patient ID: Jermaine Brady, male   DOB: 18-May-1951, 71 y.o.   MRN: [id number] ? ?This visit type was conducted due to national recommendations for restrictions regarding the COVID-19 pandemic in an effort to limit this patient's exposure and mitigate transmission in our community.  ? ?Virtual Visit via Telephone Note ? ?I connected with [name] on 10/15/21 at  3:45 PM EDT by telephone and verified that I am speaking with the correct person using two identifiers. ?  ?I discussed the limitations, risks, security and privacy concerns of performing an evaluation and management service by telephone and the availability of in person appointments. I also discussed with the patient that there may be a patient responsible charge related to this service. The patient expressed understanding and agreed to proceed. ? ?Location patient: home ?Location provider: work or home office ?Participants present for the call: patient, provider ?Patient did not have a visit in the prior 7 days to address this/these issue(s). ? ? ?History of Present Illness: ? ?Kyheem had recent COVID infection.  He had been at [facility] last week at a large meeting with about 100,000 people.  Frequent bus rides and of course plane flight back.  Friday he thought he was developing a mild cold.  By Saturday he felt worse with extreme body aches and some headache.  He did home COVID test which came back positive.  He called on call Dr. Saturday in prescription of molnupiravir is called in.  He felt dramatically better the very next day.  At this point, and has only mild residual cough.  No fever.  No dyspnea.  No significant body aches.  Already back at work but he is very isolated from others. ? ?Past Medical History:  ?Diagnosis Date  ? ALLERGIC RHINITIS 10/23/2008  ? Arthritis   ? "neck" (05/19/2016)  ? DIVERTICULOSIS, COLON 10/23/2008  ? Heart murmur   ? Hepatitis C   ? "don't know when I contracted this; went thru treatment in ~ 2007-2008" ="cured  per pt"  ? History of blood transfusion 2008?  ? "during tx to cure my Hep C"  ? History of kidney stones   ? HYPERTENSION 10/23/2008  ? ?Past Surgical History:  ?Procedure Laterality Date  ? ABDOMINAL HERNIA REPAIR  2005  ? ASD REPAIR  1971  ? ASD REPAIR  age 83  ? CARDIAC CATHETERIZATION  1960s - 1970s X 2  ? "at [facility]"  ? COLON SURGERY  2007  ? ,resection, diverticular rupture  ? COLONOSCOPY  06/12/2015  ? HERNIA REPAIR  2005  ? POLYPECTOMY    ? SHOULDER ARTHROSCOPY W/ ROTATOR CUFF REPAIR Right 2014  ? ? reports that he has never smoked. He has never used smokeless tobacco. He reports current alcohol use of about 2.0 standard drinks per week. He reports that he does not use drugs. ?family history includes Alcohol abuse in his father and another family member; Heart disease in an other family member. ?No Known Allergies ? ?  ?Observations/Objective: ?Patient sounds cheerful and well on the phone. ?I do not appreciate any SOB. ?Speech and thought processing are grossly intact. ?Patient reported vitals: ? ?Assessment and Plan: ? ?COVID infection.  Patient dramatically better after starting molnupiravir.  Minimal residual symptoms at this time. ? ?-Continue plenty fluids and rest ?-Follow-up for any recurrent symptoms ? ?Follow Up Instructions: ? ?99441 5-10 ?99442 11-20 ?99443 21-30 ?I did not refer this patient for an OV in the next 24 hours for this/these issue(s). ? ?  I discussed the assessment and treatment plan with the patient. The patient was provided an opportunity to ask questions and all were answered. The patient agreed with the plan and demonstrated an understanding of the instructions. ?  ?The patient was advised to call back or seek an in-person evaluation if the symptoms worsen or if the condition fails to improve as anticipated. ? ?I provided 11 minutes of non-face-to-face time during this encounter. ? ? ?Carolann Littler, MD  ? ?

## 2021-10-16 ENCOUNTER — Encounter: Payer: Self-pay | Admitting: Internal Medicine

## 2021-10-28 DIAGNOSIS — H401112 Primary open-angle glaucoma, right eye, moderate stage: Secondary | ICD-10-CM | POA: Diagnosis not present

## 2021-10-28 DIAGNOSIS — Z961 Presence of intraocular lens: Secondary | ICD-10-CM | POA: Diagnosis not present

## 2021-10-28 DIAGNOSIS — H401121 Primary open-angle glaucoma, left eye, mild stage: Secondary | ICD-10-CM | POA: Diagnosis not present

## 2021-10-30 ENCOUNTER — Encounter: Payer: Self-pay | Admitting: Internal Medicine

## 2021-11-11 ENCOUNTER — Other Ambulatory Visit: Payer: Self-pay

## 2021-11-11 ENCOUNTER — Ambulatory Visit (AMBULATORY_SURGERY_CENTER): Payer: PPO | Admitting: *Deleted

## 2021-11-11 VITALS — Ht 73.0 in | Wt 190.0 lb

## 2021-11-11 DIAGNOSIS — Z86010 Personal history of colonic polyps: Secondary | ICD-10-CM

## 2021-11-11 MED ORDER — PEG 3350-KCL-NA BICARB-NACL 420 G PO SOLR: 4000.0000 mL | Freq: Once | ORAL | 0 refills | Status: AC

## 2021-11-11 NOTE — Progress Notes (Signed)
No egg or soy allergy known to patient  ?No issues known to pt with past sedation with any surgeries or procedures ?Patient denies ever being told they had issues or difficulty with intubation  ?No FH of Malignant Hyperthermia ?Pt is not on diet pills ?Pt is not on  home 02  ?Pt is not on blood thinners  ?Pt denies issues with constipation  ?No A fib or A flutter ?Coupon to pt in PV today , Code to Pharmacy and  NO PA's for preps discussed with pt In PV today  ?Discussed with pt there will be an out-of-pocket cost for prep and that varies from $0 to 70 +  dollars - pt verbalized understanding  ?Pt instructed to use Singlecare.com or GoodRx for a price reduction on prep  ? ?PV completed over the phone. Pt verified name, DOB, address and insurance during PV today.  ?Pt mailed instruction packet with copy of consent form to read and not return, and instructions.  ?Pt encouraged to call with questions or issues.  ?If pt has My chart, procedure instructions sent via My Chart   ?

## 2021-11-18 ENCOUNTER — Encounter: Payer: Self-pay | Admitting: Internal Medicine

## 2021-11-24 ENCOUNTER — Encounter: Payer: Self-pay | Admitting: Certified Registered Nurse Anesthetist

## 2021-12-02 ENCOUNTER — Encounter: Payer: Self-pay | Admitting: Internal Medicine

## 2021-12-02 ENCOUNTER — Ambulatory Visit (AMBULATORY_SURGERY_CENTER): Payer: PPO | Admitting: Internal Medicine

## 2021-12-02 VITALS — BP 113/57 | HR 54 | Temp 98.4°F | Resp 12 | Ht 73.0 in | Wt 190.0 lb

## 2021-12-02 DIAGNOSIS — Z86010 Personal history of colonic polyps: Secondary | ICD-10-CM

## 2021-12-02 DIAGNOSIS — D123 Benign neoplasm of transverse colon: Secondary | ICD-10-CM | POA: Diagnosis not present

## 2021-12-02 DIAGNOSIS — I10 Essential (primary) hypertension: Secondary | ICD-10-CM | POA: Diagnosis not present

## 2021-12-02 MED ORDER — SODIUM CHLORIDE 0.9 % IV SOLN: 500.0000 mL | Freq: Once | INTRAVENOUS | Status: DC

## 2021-12-02 NOTE — Progress Notes (Signed)
Report given to PACU, vss 

## 2021-12-02 NOTE — Progress Notes (Signed)
? ?GASTROENTEROLOGY PROCEDURE H&P NOTE  ? ?Primary Care Physician: ?Eulas Post, MD ? ? ? ?Reason for Procedure:  Personal history of colon polyps ? ?Plan:    Surveillance colonoscopy ? ?Patient is appropriate for endoscopic procedure(s) in the ambulatory ([employer]) setting. ? ?The nature of the procedure, as well as the risks, benefits, and alternatives were carefully and thoroughly reviewed with the patient. Ample time for discussion and questions allowed. The patient understood, was satisfied, and agreed to proceed.  ? ? ? ?HPI: ?Jermaine Brady is a 71 y.o. male who presents for surveillance colonoscopy.  Medical history as below.  Tolerated the prep.  No recent chest pain or shortness of breath.  No abdominal pain today. ? ?Past Medical History:  ?Diagnosis Date  ? ALLERGIC RHINITIS 10/23/2008  ? Allergy   ? seasonal  ? Arthritis   ? "neck" (05/19/2016)  ? Cataract   ? bilateral,removed  ? DIVERTICULOSIS, COLON 10/23/2008  ? Heart murmur   ? Hepatitis C   ? "don't know when I contracted this; went thru treatment in ~ 2007-2008" ="cured per pt"  ? History of blood transfusion 2008?  ? "during tx to cure my Hep C"  ? History of kidney stones   ? HYPERTENSION 10/23/2008  ? ? ?Past Surgical History:  ?Procedure Laterality Date  ? ABDOMINAL HERNIA REPAIR  2005  ? ASD REPAIR  1971  ? ASD REPAIR  age 36  ? CARDIAC CATHETERIZATION  1960s - 1970s X 2  ? "at [facility]"  ? cataracts Bilateral   ? COLON SURGERY  2007  ? ,resection, diverticular rupture  ? COLONOSCOPY  06/12/2015  ? HERNIA REPAIR  2005  ? POLYPECTOMY    ? SHOULDER ARTHROSCOPY W/ ROTATOR CUFF REPAIR Right 2014  ? ? ?Prior to Admission medications   ?Medication Sig Start Date End Date Taking? Authorizing Provider  ?aspirin EC 81 MG tablet Take 81 mg by mouth at bedtime.   Yes [provider]  ?Cholecalciferol (VITAMIN D3) 1000 UNITS CAPS Take 1,000 Units by mouth at bedtime.    Yes [provider]  ?fexofenadine (ALLEGRA) 180 MG tablet Take  180 mg by mouth daily as needed for allergies.   Yes [provider]  ?latanoprost (XALATAN) 0.005 % ophthalmic solution 1 drop daily. 02/11/20  Yes [provider]  ?lisinopril-hydrochlorothiazide (ZESTORETIC) 20-12.5 MG tablet TAKE 1 TABLET BY MOUTH EVERY DAY 05/29/21  Yes Burchette, Alinda Sierras, MD  ?tamsulosin (FLOMAX) 0.4 MG CAPS capsule Take 0.4 mg by mouth at bedtime.   Yes [provider]  ?naproxen sodium (ALEVE) 220 MG tablet Take 440 mg by mouth 2 (two) times daily as needed (pain/headache). ?Patient not taking: Reported on 12/02/2021    [provider]  ?sildenafil (REVATIO) 20 MG tablet Take 40 mg by mouth daily as needed (erectile dysfunction).    [provider]  ? ? ?Current Outpatient Medications  ?Medication Sig Dispense Refill  ? aspirin EC 81 MG tablet Take 81 mg by mouth at bedtime.    ? Cholecalciferol (VITAMIN D3) 1000 UNITS CAPS Take 1,000 Units by mouth at bedtime.     ? fexofenadine (ALLEGRA) 180 MG tablet Take 180 mg by mouth daily as needed for allergies.    ? latanoprost (XALATAN) 0.005 % ophthalmic solution 1 drop daily.    ? lisinopril-hydrochlorothiazide (ZESTORETIC) 20-12.5 MG tablet TAKE 1 TABLET BY MOUTH EVERY DAY 90 tablet 3  ? tamsulosin (FLOMAX) 0.4 MG CAPS capsule Take 0.4 mg by mouth  at bedtime.    ? naproxen sodium (ALEVE) 220 MG tablet Take 440 mg by mouth 2 (two) times daily as needed (pain/headache). (Patient not taking: Reported on 12/02/2021)    ? sildenafil (REVATIO) 20 MG tablet Take 40 mg by mouth daily as needed (erectile dysfunction).    ? ?Current Facility-Administered Medications  ?Medication Dose Route Frequency Provider Last Rate Last Admin  ? 0.9 %  sodium chloride infusion  500 mL Intravenous Once Samanth Mirkin, Lajuan Lines, MD      ? ? ?Allergies as of 12/02/2021  ? (No Known Allergies)  ? ? ?Family History  ?Problem Relation Age of Onset  ? Alcohol abuse Father   ? Alcohol abuse Other   ? Heart disease Other   ? Colon cancer Neg Hx   ?  Colon polyps Neg Hx   ? Esophageal cancer Neg Hx   ? Rectal cancer Neg Hx   ? Stomach cancer Neg Hx   ? Crohn's disease Neg Hx   ? ? ?Social History  ? ?Socioeconomic History  ? Marital status: Married  ?  Spouse name: Not on file  ? Number of children: Not on file  ? Years of education: Not on file  ? Highest education level: Not on file  ?Occupational History  ? Not on file  ?Tobacco Use  ? Smoking status: Never  ?  Passive exposure: Past (as a kid)  ? Smokeless tobacco: Never  ?Vaping Use  ? Vaping Use: Never used  ?Substance and Sexual Activity  ? Alcohol use: Yes  ?  Alcohol/week: 2.0 standard drinks  ?  Types: 2 Cans of beer per week  ?  Comment: 1-2 x a week  ? Drug use: No  ? Sexual activity: Yes  ?Other Topics Concern  ? Not on file  ?Social History Narrative  ? Not on file  ? ?Social Determinants of Health  ? ?Financial Resource Strain: Low Risk   ? Difficulty of Paying Living Expenses: Not hard at all  ?Food Insecurity: No Food Insecurity  ? Worried About Charity fundraiser in the Last Year: Never true  ? Ran Out of Food in the Last Year: Never true  ?Transportation Needs: No Transportation Needs  ? Lack of Transportation (Medical): No  ? Lack of Transportation (Non-Medical): No  ?Physical Activity: Insufficiently Active  ? Days of Exercise per Week: 7 days  ? Minutes of Exercise per Session: 20 min  ?Stress: No Stress Concern Present  ? Feeling of Stress : Not at all  ?Social Connections: Moderately Integrated  ? Frequency of Communication with Friends and Family: More than three times a week  ? Frequency of Social Gatherings with Friends and Family: Once a week  ? Attends Religious Services: Never  ? Active Member of Clubs or Organizations: Yes  ? Attends Archivist Meetings: 1 to 4 times per year  ? Marital Status: Married  ?Intimate Partner Violence: Not At Risk  ? Fear of Current or Ex-Partner: No  ? Emotionally Abused: No  ? Physically Abused: No  ? Sexually Abused: No  ? ? ?Physical  Exam: ?Vital signs in last 24 hours: ?'@BP'$  (!) 132/55   Pulse 77   Temp 98.4 ?F (36.9 ?C)   Ht '6\' 1"'$  (1.854 m)   Wt 190 lb (86.2 kg)   SpO2 98%   BMI 25.07 kg/m?  ?GEN: NAD ?EYE: Sclerae anicteric ?ENT: MMM ?CV: Non-tachycardic ?Pulm: CTA b/l ?GI: Soft, NT/ND ?NEURO:  Alert & Oriented  x 3 ? ? ?Jermaine Jarred, MD ?[facility] ? ?12/02/2021 3:50 PM ? ?

## 2021-12-02 NOTE — Patient Instructions (Signed)

## 2021-12-02 NOTE — Progress Notes (Signed)
Called to room to assist during endoscopic procedure.  Patient ID and intended procedure confirmed with present staff. Received instructions for my participation in the procedure from the performing physician.  

## 2021-12-02 NOTE — Progress Notes (Signed)
Pt. Reports no change in his medical or surgical history since his pre-visit 11/11/2021. ?

## 2021-12-02 NOTE — Op Note (Signed)
[facility] ?Patient Name: Jermaine Brady ?Procedure Date: 12/02/2021 4:01 PM ?MRN: [id number] ?Endoscopist: Jerene Bears , MD ?Age: 71 ?Referring MD:  ?Date of Birth: 08/16/1950 ?Gender: Male ?Account #: [id number] ?Procedure:                Colonoscopy ?Indications:              High risk colon cancer surveillance: Personal  ?                          history of adenomatous and sessile serrated colon  ?                          polyps, Last colonoscopy: February 2020 ?Medicines:                Monitored Anesthesia Care ?Procedure:                Pre-Anesthesia Assessment: ?                          - Prior to the procedure, a History and Physical  ?                          was performed, and patient medications and  ?                          allergies were reviewed. The patient's tolerance of  ?                          previous anesthesia was also reviewed. The risks  ?                          and benefits of the procedure and the sedation  ?                          options and risks were discussed with the patient.  ?                          All questions were answered, and informed consent  ?                          was obtained. Prior Anticoagulants: The patient has  ?                          taken no previous anticoagulant or antiplatelet  ?                          agents. ASA Grade Assessment: II - A patient with  ?                          mild systemic disease. After reviewing the risks  ?                          and benefits, the patient was deemed in  ?  satisfactory condition to undergo the procedure. ?                          After obtaining informed consent, the colonoscope  ?                          was passed under direct vision. Throughout the  ?                          procedure, the patient's blood pressure, pulse, and  ?                          oxygen saturations were monitored continuously. The  ?                          Olympus PCF-H190DL  ([id number]) Colonoscope was  ?                          introduced through the anus and advanced to the  ?                          cecum, identified by appendiceal orifice and  ?                          ileocecal valve. The colonoscopy was performed  ?                          without difficulty. The patient tolerated the  ?                          procedure well. The quality of the bowel  ?                          preparation was good. The ileocecal valve,  ?                          appendiceal orifice, and rectum were photographed. ?Scope In: 4:01:20 PM ?Scope Out: 4:19:35 PM ?Scope Withdrawal Time: 0 hours 13 minutes 48 seconds  ?Total Procedure Duration: 0 hours 18 minutes 15 seconds  ?Findings:                 The digital rectal exam was normal. ?                          A 4 mm polyp was found in the transverse colon. The  ?                          polyp was sessile. The polyp was removed with a  ?                          cold snare. Resection was complete, but the polyp  ?                          tissue was not retrieved. ?  Multiple small-mouthed diverticula were found in  ?                          the sigmoid colon and descending colon. ?                          There was evidence of a prior end-to-end  ?                          colo-colonic anastomosis in the sigmoid colon. This  ?                          was patent and was characterized by healthy  ?                          appearing mucosa. The anastomosis was traversed. ?                          The retroflexed view of the distal rectum and anal  ?                          verge was normal and showed no anal or rectal  ?                          abnormalities. ?Complications:            No immediate complications. ?Estimated Blood Loss:     Estimated blood loss was minimal. ?Impression:               - One 4 mm polyp in the transverse colon, removed  ?                          with a cold snare. Complete resection.  Polyp tissue  ?                          not retrieved. ?                          - Diverticulosis in the sigmoid colon and in the  ?                          descending colon. ?                          - Patent end-to-end colo-colonic anastomosis,  ?                          characterized by healthy appearing mucosa. ?                          - The distal rectum and anal verge are normal on  ?                          retroflexion view. ?Recommendation:           - Patient has a contact number available for  ?  emergencies. The signs and symptoms of potential  ?                          delayed complications were discussed with the  ?                          patient. Return to normal activities tomorrow.  ?                          Written discharge instructions were provided to the  ?                          patient. ?                          - Resume previous diet. ?                          - Continue present medications. ?                          - Repeat colonoscopy in 5 years for surveillance. ?Jerene Bears, MD ?12/02/2021 4:23:43 PM ?This report has been signed electronically. ?

## 2021-12-04 ENCOUNTER — Telehealth: Payer: Self-pay | Admitting: *Deleted

## 2021-12-04 NOTE — Telephone Encounter (Signed)
?  Follow up Call- ? ? ?  12/02/2021  ?  2:57 PM  ?Call back number  ?Post procedure Call Back phone  # [phone]  ?Permission to leave phone message Yes  ? LMOM ?

## 2021-12-04 NOTE — Telephone Encounter (Signed)
?  Follow up Call- ? ? ?  12/02/2021  ?  2:57 PM  ?Call back number  ?Post procedure Call Back phone  # [phone]  ?Permission to leave phone message Yes  ?  ? ?Patient questions: ? ?Do you have a fever, pain , or abdominal swelling? No. ?Pain Score  0 * ? ?Have you tolerated food without any problems? Yes.   ? ?Have you been able to return to your normal activities? Yes.   ? ?Do you have any questions about your discharge instructions: ?Diet   No. ?Medications  No. ?Follow up visit  No. ? ?Do you have questions or concerns about your Care? No. ? ?Actions: ?* If pain score is 4 or above: ?No action needed, pain <4. ? ? ?

## 2022-02-25 DIAGNOSIS — Z961 Presence of intraocular lens: Secondary | ICD-10-CM | POA: Diagnosis not present

## 2022-02-25 DIAGNOSIS — H401112 Primary open-angle glaucoma, right eye, moderate stage: Secondary | ICD-10-CM | POA: Diagnosis not present

## 2022-03-13 DIAGNOSIS — N401 Enlarged prostate with lower urinary tract symptoms: Secondary | ICD-10-CM | POA: Diagnosis not present

## 2022-03-20 DIAGNOSIS — R351 Nocturia: Secondary | ICD-10-CM | POA: Diagnosis not present

## 2022-03-20 DIAGNOSIS — N403 Nodular prostate with lower urinary tract symptoms: Secondary | ICD-10-CM | POA: Diagnosis not present

## 2022-03-20 DIAGNOSIS — N5201 Erectile dysfunction due to arterial insufficiency: Secondary | ICD-10-CM | POA: Diagnosis not present

## 2022-05-29 ENCOUNTER — Other Ambulatory Visit: Payer: Self-pay | Admitting: Family Medicine

## 2022-06-24 ENCOUNTER — Other Ambulatory Visit: Payer: Self-pay | Admitting: Family Medicine

## 2022-07-02 DIAGNOSIS — Z961 Presence of intraocular lens: Secondary | ICD-10-CM | POA: Diagnosis not present

## 2022-07-02 DIAGNOSIS — H401112 Primary open-angle glaucoma, right eye, moderate stage: Secondary | ICD-10-CM | POA: Diagnosis not present

## 2022-09-22 ENCOUNTER — Ambulatory Visit (INDEPENDENT_AMBULATORY_CARE_PROVIDER_SITE_OTHER): Payer: PPO

## 2022-09-22 VITALS — Ht 73.0 in | Wt 185.0 lb

## 2022-09-22 DIAGNOSIS — Z0000 Encounter for general adult medical examination without abnormal findings: Secondary | ICD-10-CM

## 2022-09-22 NOTE — Progress Notes (Addendum)
Subjective:   Jermaine Brady is a 72 y.o. male who presents for Medicare Annual/Subsequent preventive examination.  Review of Systems    Virtual Visit via Telephone Note  I connected with  Jermaine Brady on 09/22/22 at  9:15 AM EST by telephone and verified that I am speaking with the correct person using two identifiers.  Location: Patient: Home Provider: Office Persons participating in the virtual visit: patient/Nurse Health Advisor   I discussed the limitations, risks, security and privacy concerns of performing an evaluation and management service by telephone and the availability of in person appointments. The patient expressed understanding and agreed to proceed.  Interactive audio and video telecommunications were attempted between this nurse and patient, however failed, due to patient having technical difficulties OR patient did not have access to video capability.  We continued and completed visit with audio only.  Some vital signs may be absent or patient reported.   Jermaine Peaches, LPN  Cardiac Risk Factors include: advanced age (>37mn, >>35women);male gender;hypertension     Objective:    Today's Vitals   09/22/22 0911  Weight: 185 lb (83.9 kg)  Height: '6\' 1"'$  (1.854 m)   Body mass index is 24.41 kg/m.     09/22/2022    9:16 AM 09/19/2021    9:14 AM 10/09/2020    3:35 PM 10/10/2019    8:09 AM 05/19/2016    9:29 AM 05/29/2015    8:19 AM  Advanced Directives  Does Patient Have a Medical Advance Directive? Yes Yes Yes No No Yes  Type of AParamedicof ASylvaLiving will H[place]Living will HPocaLiving will   HLe [place]Living will  Does patient want to make changes to medical advance directive?  No - Patient declined No - Patient declined   No - Patient declined  Copy of [place] Chart? No - copy requested No - copy requested No - copy requested    No - copy requested  Would patient like information on creating a medical advance directive?    No - Patient declined No - patient declined information     Current Medications (verified) Outpatient Encounter Medications as of 09/22/2022  Medication Sig   aspirin EC 81 MG tablet Take 81 mg by mouth at bedtime.   Cholecalciferol (VITAMIN D3) 1000 UNITS CAPS Take 1,000 Units by mouth at bedtime.    fexofenadine (ALLEGRA) 180 MG tablet Take 180 mg by mouth daily as needed for allergies.   latanoprost (XALATAN) 0.005 % ophthalmic solution 1 drop daily.   lisinopril-hydrochlorothiazide (ZESTORETIC) 20-12.5 MG tablet TAKE 1 TABLET BY MOUTH EVERY DAY   naproxen sodium (ALEVE) 220 MG tablet Take 440 mg by mouth 2 (two) times daily as needed (pain/headache). (Patient not taking: Reported on 12/02/2021)   sildenafil (REVATIO) 20 MG tablet Take 40 mg by mouth daily as needed (erectile dysfunction).   tamsulosin (FLOMAX) 0.4 MG CAPS capsule Take 0.4 mg by mouth at bedtime.   No facility-administered encounter medications on file as of 09/22/2022.    Allergies (verified) Patient has no known allergies.   History: Past Medical History:  Diagnosis Date   ALLERGIC RHINITIS 10/23/2008   Allergy    seasonal   Arthritis    "neck" (05/19/2016)   Cataract    bilateral,removed   DIVERTICULOSIS, COLON 10/23/2008   Heart murmur    Hepatitis C    "don't know when I contracted this; went thru  treatment in ~ 2007-2008" ="cured per pt"   History of blood transfusion 2008?   "during tx to cure my Hep C"   History of kidney stones    HYPERTENSION 10/23/2008   Past Surgical History:  Procedure Laterality Date   ABDOMINAL HERNIA REPAIR  2005   ASD REPAIR  1971   ASD REPAIR  age 30   CARDIAC CATHETERIZATION  1960s - 38s X 2   "at [place]"   cataracts Bilateral    COLON SURGERY  2007   ,resection, diverticular rupture   COLONOSCOPY  06/12/2015   HERNIA REPAIR  2005   POLYPECTOMY     SHOULDER  ARTHROSCOPY W/ ROTATOR CUFF REPAIR Right 2014   Family History  Problem Relation Age of Onset   Alcohol abuse Father    Alcohol abuse Other    Heart disease Other    Colon cancer Neg Hx    Colon polyps Neg Hx    Esophageal cancer Neg Hx    Rectal cancer Neg Hx    Stomach cancer Neg Hx    Crohn's disease Neg Hx    Social History   Socioeconomic History   Marital status: Married    Spouse name: Not on file   Number of children: Not on file   Years of education: Not on file   Highest education level: Not on file  Occupational History   Not on file  Tobacco Use   Smoking status: Never    Passive exposure: Past (as a kid)   Smokeless tobacco: Never  Vaping Use   Vaping Use: Never used  Substance and Sexual Activity   Alcohol use: Yes    Alcohol/week: 2.0 standard drinks of alcohol    Types: 2 Cans of beer per week    Comment: 1-2 x a week   Drug use: No   Sexual activity: Yes  Other Topics Concern   Not on file  Social History Narrative   Not on file   Social Determinants of Health   Financial Resource Strain: Low Risk  (09/22/2022)   Overall Financial Resource Strain (CARDIA)    Difficulty of Paying Living Expenses: Not hard at all  Food Insecurity: No Food Insecurity (09/22/2022)   Hunger Vital Sign    Worried About Running Out of Food in the Last Year: Never true    Ran Out of Food in the Last Year: Never true  Transportation Needs: No Transportation Needs (09/22/2022)   PRAPARE - Hydrologist (Medical): No    Lack of Transportation (Non-Medical): No  Physical Activity: Sufficiently Active (09/22/2022)   Exercise Vital Sign    Days of Exercise per Week: 7 days    Minutes of Exercise per Session: 30 min  Stress: No Stress Concern Present (09/22/2022)   [place]    Feeling of Stress : Not at all  Social Connections: [place] (09/22/2022)   Social Connection  and Isolation Panel [NHANES]    Frequency of Communication with Friends and Family: More than three times a week    Frequency of Social Gatherings with Friends and Family: More than three times a week    Attends Religious Services: More than 4 times per year    Active Member of [employer] or Organizations: Yes    Attends Music therapist: More than 4 times per year    Marital Status: Married    Tobacco Counseling Counseling given: Not Answered  Clinical Intake:  Pre-visit preparation completed: Yes  Pain : No/denies pain     BMI - recorded: 24.41 Nutritional Status: BMI of 19-24  Normal Nutritional Risks: None Diabetes: No  How often do you need to have someone help you when you read instructions, pamphlets, or other written materials from your doctor or pharmacy?: 1 - Never  Diabetic?  No  Interpreter Needed?: No  Information entered by :: Rolene Arbour LPN   Activities of Daily Living    09/22/2022    9:15 AM  In your present state of health, do you have any difficulty performing the following activities:  Hearing? 1  Comment Wars hearing aids  Vision? 0  Difficulty concentrating or making decisions? 0  Walking or climbing stairs? 0  Dressing or bathing? 0  Doing errands, shopping? 0  Preparing Food and eating ? N  Using the Toilet? N  In the past six months, have you accidently leaked urine? N  Do you have problems with loss of bowel control? N  Managing your Medications? N  Managing your Finances? N  Housekeeping or managing your Housekeeping? N    Patient Care Team: Eulas Post, MD as PCP - General Tamala Julian Lynnell Dike, MD (Inactive) as PCP - Cardiology (Cardiology)  Indicate any recent Medical Services you may have received from other than [facility] providers in the past year (date may be approximate).     Assessment:   This is a routine wellness examination for [place].  Hearing/Vision screen Hearing Screening - Comments:: Wears hearing  aids Vision Screening - Comments:: Wears rx glasses - up to date with routine eye exams with  Dr Katy Fitch  Dietary issues and exercise activities discussed: Current Exercise Habits: Home exercise routine, Type of exercise: walking, Time (Minutes): 30, Frequency (Times/Week): 7, Weekly Exercise (Minutes/Week): 210, Intensity: Moderate, Exercise limited by: None identified   Goals Addressed               This Visit's Progress     Patient Stated (pt-stated)        I would like to continue maintaining my current health and keep working.       Depression Screen    09/22/2022    9:14 AM 09/19/2021    9:09 AM 10/09/2020    3:37 PM 09/02/2019    4:17 PM 06/07/2018   10:17 AM 06/03/2016    1:42 PM  PHQ 2/9 Scores  PHQ - 2 Score 0 0 0 0 0 0  PHQ- 9 Score    0      Fall Risk    09/22/2022    9:16 AM 09/19/2021    9:13 AM 09/16/2021    5:47 PM 10/09/2020    3:36 PM 06/22/2019    9:39 AM  Fall Risk   Falls in the past year? 0 0 0 0 0  Comment     Emmi Telephone Survey: data to providers prior to load  Number falls in past yr: 0 0 0 0   Injury with Fall? 0 0 0 0   Risk for fall due to : No Fall Risks No Fall Risks  No Fall Risks   Follow up Falls prevention discussed   Falls evaluation completed;Falls prevention discussed     FALL RISK PREVENTION PERTAINING TO THE HOME:  Any stairs in or around the home? Yes  If so, are there any without handrails? No  Home free of loose throw rugs in walkways, pet beds, electrical cords, etc? Yes  Adequate lighting in your home to reduce risk of falls? Yes   ASSISTIVE DEVICES UTILIZED TO PREVENT FALLS:  Life alert? No  Use of a cane, walker or w/c? No  Grab bars in the bathroom? No  Shower chair or bench in shower? Yes Elevated toilet seat or a handicapped toilet? No   TIMED UP AND GO:  Was the test performed? No . Audio Visit   Cognitive Function:        09/22/2022    9:16 AM 09/19/2021    9:14 AM  6CIT Screen  What Year? 0 points 0  points  What month? 0 points 0 points  What time? 0 points 0 points  Count back from 20 0 points 0 points  Months in reverse 0 points 0 points  Repeat phrase 0 points 0 points  Total Score 0 points 0 points    Immunizations Immunization History  Administered Date(s) Administered   Fluad Quad(high Dose 65+) 05/09/2019, 05/26/2021, 05/12/2022   Influenza, High Dose Seasonal PF 08/21/2017, 06/07/2018, 05/13/2020   Influenza,inj,Quad PF,6+ Mos 06/07/2013, 07/31/2014, 05/20/2016   Influenza,trivalent, recombinat, inj, PF 06/05/2014   PFIZER(Purple Top)SARS-COV-2 Vaccination 09/03/2019, 09/28/2019, 06/02/2021   Pneumococcal Conjugate-13 08/21/2017   Pneumococcal Polysaccharide-23 05/20/2016   Td 07/28/2004   Tdap 12/22/2014   Zoster, Live 10/25/2012    TDAP status: Up to date  Flu Vaccine status: Up to date  Pneumococcal vaccine status: Up to date  Covid-19 vaccine status: Completed vaccines  Qualifies for Shingles Vaccine? Yes   Zostavax completed No   Shingrix Completed?: No.    Education has been provided regarding the importance of this vaccine. Patient has been advised to call insurance company to determine out of pocket expense if they have not yet received this vaccine. Advised may also receive vaccine at local pharmacy or Health Dept. Verbalized acceptance and understanding.  Screening Tests Health Maintenance  Topic Date Due   COVID-19 Vaccine (4 - 2023-24 season) 10/08/2022 (Originally 03/28/2022)   Zoster Vaccines- Shingrix (1 of 2) 12/21/2022 (Originally 03/18/2000)   Medicare Annual Wellness (AWV)  09/23/2023   DTaP/Tdap/Td (3 - Td or Tdap) 12/21/2024   COLONOSCOPY (Pts 45-28yr Insurance coverage will need to be confirmed)  12/03/2026   Pneumonia Vaccine 72 Years old  Completed   INFLUENZA VACCINE  Completed   Hepatitis C Screening  Completed   HPV VACCINES  Aged Out    Health Maintenance  There are no preventive care reminders to display for this  patient.   Colorectal cancer screening: Type of screening: Colonoscopy. Completed 12/02/21. Repeat every 5 years  Lung Cancer Screening: (Low Dose CT Chest recommended if Age 73-80years, 30 pack-year currently smoking OR have quit w/in 15years.) does not qualify.     Additional Screening:  Hepatitis C Screening: does qualify; Completed 11/06/10  Vision Screening: Recommended annual ophthalmology exams for early detection of glaucoma and other disorders of the eye. Is the patient up to date with their annual eye exam?  Yes  Who is the provider or what is the name of the office in which the patient attends annual eye exams? Dr GKaty FitchIf pt is not established with a provider, would they like to be referred to a provider to establish care? No .   Dental Screening: Recommended annual dental exams for proper oral hygiene  Community Resource Referral / Chronic Care Management:  CRR required this visit?  No   CCM required this visit?  No      Plan:  I have personally reviewed and noted the following in the patient's chart:   Medical and social history Use of alcohol, tobacco or illicit drugs  Current medications and supplements including opioid prescriptions. Patient is not currently taking opioid prescriptions. Functional ability and status Nutritional status Physical activity Advanced directives List of other physicians Hospitalizations, surgeries, and ER visits in previous 12 months Vitals Screenings to include cognitive, depression, and falls Referrals and appointments  In addition, I have reviewed and discussed with patient certain preventive protocols, quality metrics, and best practice recommendations. A written personalized care plan for preventive services as well as general preventive health recommendations were provided to patient.     Jermaine Peaches, LPN   [id number]   Nurse Notes: None

## 2022-09-22 NOTE — Patient Instructions (Addendum)
Jermaine Brady , Thank you for taking time to come for your Medicare Wellness Visit. I appreciate your ongoing commitment to your health goals. Please review the following plan we discussed and let me know if I can assist you in the future.   These are the goals we discussed:  Goals       Patient Stated (pt-stated)      I would like to continue maintaining my current health and keep working.        This is a list of the screening recommended for you and due dates:  Health Maintenance  Topic Date Due   COVID-19 Vaccine (4 - 2023-24 season) 10/08/2022*   Zoster (Shingles) Vaccine (1 of 2) 12/21/2022*   Medicare Annual Wellness Visit  09/23/2023   DTaP/Tdap/Td vaccine (3 - Td or Tdap) 12/21/2024   Colon Cancer Screening  12/03/2026   Pneumonia Vaccine  Completed   Flu Shot  Completed   Hepatitis C Screening: USPSTF Recommendation to screen - Ages 18-79 yo.  Completed   HPV Vaccine  Aged Out  *Topic was postponed. The date shown is not the original due date.    Advanced directives: Please bring a copy of your health care power of attorney and living will to the office to be added to your chart at your convenience.   Conditions/risks identified: None  Next appointment: Follow up in one year for your annual wellness visit.    Preventive Care 26 Years and Older, Male  Preventive care refers to lifestyle choices and visits with your health care provider that can promote health and wellness. What does preventive care include? A yearly physical exam. This is also called an annual well check. Dental exams once or twice a year. Routine eye exams. Ask your health care provider how often you should have your eyes checked. Personal lifestyle choices, including: Daily care of your teeth and gums. Regular physical activity. Eating a healthy diet. Avoiding tobacco and drug use. Limiting alcohol use. Practicing safe sex. Taking low doses of aspirin every day. Taking vitamin and mineral  supplements as recommended by your health care provider. What happens during an annual well check? The services and screenings done by your health care provider during your annual well check will depend on your age, overall health, lifestyle risk factors, and family history of disease. Counseling  Your health care provider may ask you questions about your: Alcohol use. Tobacco use. Drug use. Emotional well-being. Home and relationship well-being. Sexual activity. Eating habits. History of falls. Memory and ability to understand (cognition). Work and work Statistician. Screening  You may have the following tests or measurements: Height, weight, and BMI. Blood pressure. Lipid and cholesterol levels. These may be checked every 5 years, or more frequently if you are over 69 years old. Skin check. Lung cancer screening. You may have this screening every year starting at age 49 if you have a 30-pack-year history of smoking and currently smoke or have quit within the past 15 years. Fecal occult blood test (FOBT) of the stool. You may have this test every year starting at age 82. Flexible sigmoidoscopy or colonoscopy. You may have a sigmoidoscopy every 5 years or a colonoscopy every 10 years starting at age 39. Prostate cancer screening. Recommendations will vary depending on your family history and other risks. Hepatitis C blood test. Hepatitis B blood test. Sexually transmitted disease (STD) testing. Diabetes screening. This is done by checking your blood sugar (glucose) after you have not eaten for a  while (fasting). You may have this done every 1-3 years. Abdominal aortic aneurysm (AAA) screening. You may need this if you are a current or former smoker. Osteoporosis. You may be screened starting at age 62 if you are at high risk. Talk with your health care provider about your test results, treatment options, and if necessary, the need for more tests. Vaccines  Your health care provider  may recommend certain vaccines, such as: Influenza vaccine. This is recommended every year. Tetanus, diphtheria, and acellular pertussis (Tdap, Td) vaccine. You may need a Td booster every 10 years. Zoster vaccine. You may need this after age 1. Pneumococcal 13-valent conjugate (PCV13) vaccine. One dose is recommended after age 57. Pneumococcal polysaccharide (PPSV23) vaccine. One dose is recommended after age 45. Talk to your health care provider about which screenings and vaccines you need and how often you need them. This information is not intended to replace advice given to you by your health care provider. Make sure you discuss any questions you have with your health care provider. Document Released: 08/10/2015 Document Revised: 04/02/2016 Document Reviewed: 05/15/2015 Elsevier Interactive Patient Education  2017 [place] Prevention in the Home Falls can cause injuries. They can happen to people of all ages. There are many things you can do to make your home safe and to help prevent falls. What can I do on the outside of my home? Regularly fix the edges of walkways and driveways and fix any cracks. Remove anything that might make you trip as you walk through a door, such as a raised step or threshold. Trim any bushes or trees on the path to your home. Use bright outdoor lighting. Clear any walking paths of anything that might make someone trip, such as rocks or tools. Regularly check to see if handrails are loose or broken. Make sure that both sides of any steps have handrails. Any raised decks and porches should have guardrails on the edges. Have any leaves, snow, or ice cleared regularly. Use sand or salt on walking paths during winter. Clean up any spills in your garage right away. This includes oil or grease spills. What can I do in the bathroom? Use night lights. Install grab bars by the toilet and in the tub and shower. Do not use towel bars as grab bars. Use  non-skid mats or decals in the tub or shower. If you need to sit down in the shower, use a plastic, non-slip stool. Keep the floor dry. Clean up any water that spills on the floor as soon as it happens. Remove soap buildup in the tub or shower regularly. Attach bath mats securely with double-sided non-slip rug tape. Do not have throw rugs and other things on the floor that can make you trip. What can I do in the bedroom? Use night lights. Make sure that you have a light by your bed that is easy to reach. Do not use any sheets or blankets that are too big for your bed. They should not hang down onto the floor. Have a firm chair that has side arms. You can use this for support while you get dressed. Do not have throw rugs and other things on the floor that can make you trip. What can I do in the kitchen? Clean up any spills right away. Avoid walking on wet floors. Keep items that you use a lot in easy-to-reach places. If you need to reach something above you, use a strong step stool that has a grab  bar. Keep electrical cords out of the way. Do not use floor polish or wax that makes floors slippery. If you must use wax, use non-skid floor wax. Do not have throw rugs and other things on the floor that can make you trip. What can I do with my stairs? Do not leave any items on the stairs. Make sure that there are handrails on both sides of the stairs and use them. Fix handrails that are broken or loose. Make sure that handrails are as long as the stairways. Check any carpeting to make sure that it is firmly attached to the stairs. Fix any carpet that is loose or worn. Avoid having throw rugs at the top or bottom of the stairs. If you do have throw rugs, attach them to the floor with carpet tape. Make sure that you have a light switch at the top of the stairs and the bottom of the stairs. If you do not have them, ask someone to add them for you. What else can I do to help prevent falls? Wear  shoes that: Do not have high heels. Have rubber bottoms. Are comfortable and fit you well. Are closed at the toe. Do not wear sandals. If you use a stepladder: Make sure that it is fully opened. Do not climb a closed stepladder. Make sure that both sides of the stepladder are locked into place. Ask someone to hold it for you, if possible. Clearly mark and make sure that you can see: Any grab bars or handrails. First and last steps. Where the edge of each step is. Use tools that help you move around (mobility aids) if they are needed. These include: Canes. Walkers. Scooters. Crutches. Turn on the lights when you go into a dark area. Replace any light bulbs as soon as they burn out. Set up your furniture so you have a clear path. Avoid moving your furniture around. If any of your floors are uneven, fix them. If there are any pets around you, be aware of where they are. Review your medicines with your doctor. Some medicines can make you feel dizzy. This can increase your chance of falling. Ask your doctor what other things that you can do to help prevent falls. This information is not intended to replace advice given to you by your health care provider. Make sure you discuss any questions you have with your health care provider. Document Released: 05/10/2009 Document Revised: 12/20/2015 Document Reviewed: 08/18/2014 Elsevier Interactive Patient Education  2017 [employer].

## 2022-09-26 ENCOUNTER — Other Ambulatory Visit: Payer: Self-pay | Admitting: Family Medicine

## 2022-09-30 ENCOUNTER — Ambulatory Visit (HOSPITAL_BASED_OUTPATIENT_CLINIC_OR_DEPARTMENT_OTHER)
Admission: RE | Admit: 2022-09-30 | Discharge: 2022-09-30 | Disposition: A | Discharge status: Home / Self Care | Payer: PPO | Source: Ambulatory Visit | Attending: Family Medicine | Admitting: Family Medicine

## 2022-09-30 ENCOUNTER — Ambulatory Visit (INDEPENDENT_AMBULATORY_CARE_PROVIDER_SITE_OTHER): Payer: PPO | Admitting: Family Medicine

## 2022-09-30 ENCOUNTER — Encounter: Payer: Self-pay | Admitting: Family Medicine

## 2022-09-30 VITALS — BP 134/60 | HR 90 | Temp 97.7°F | Ht 73.0 in | Wt 186.2 lb

## 2022-09-30 DIAGNOSIS — R0789 Other chest pain: Secondary | ICD-10-CM | POA: Diagnosis not present

## 2022-09-30 DIAGNOSIS — R079 Chest pain, unspecified: Secondary | ICD-10-CM | POA: Diagnosis not present

## 2022-09-30 NOTE — Progress Notes (Signed)
Established Patient Office Visit  Subjective   Patient ID: Jermaine Brady, male    DOB: 06-29-1951  Age: 72 y.o. MRN: [id number]  Chief Complaint  Patient presents with   Rib pain    Patient complains of left sided rib pain, x1 week, Denies any Shortness of breath and chest pain, Denies any known injury, Tried Ibuprofen and Tylenol    HPI   Jermaine Brady is seen with left-sided rib "soreness "for about the past week.  He denies any injury.  Location is mid axillary line just below the left breast toward the lateral breast region.  No rash.  Denies any chest pain, dyspnea, cough, fever, night sweats.  No pleuritic quality.  Has taken some Tylenol and ibuprofen with mild relief.  Movement does not seem to exacerbate or ameliorate.  Denies any calf pain or leg swelling.  Pain is intermittent.  Usually 1 out of 10 in severity when it is light and higher intensity occasionally.  No exertional quality.  Past Medical History:  Diagnosis Date   ALLERGIC RHINITIS 10/23/2008   Allergy    seasonal   Arthritis    "neck" (05/19/2016)   Cataract    bilateral,removed   DIVERTICULOSIS, COLON 10/23/2008   Heart murmur    Hepatitis C    "don't know when I contracted this; went thru treatment in ~ 2007-2008" ="cured per pt"   History of blood transfusion 2008?   "during tx to cure my Hep C"   History of kidney stones    HYPERTENSION 10/23/2008   Past Surgical History:  Procedure Laterality Date   ABDOMINAL HERNIA REPAIR  2005   ASD [place]   ASD REPAIR  age 75   CARDIAC CATHETERIZATION  1960s - 82s X 2   "at [place]"   cataracts Bilateral    COLON SURGERY  2007   ,resection, diverticular rupture   COLONOSCOPY  06/12/2015   HERNIA REPAIR  2005   POLYPECTOMY     SHOULDER ARTHROSCOPY W/ ROTATOR CUFF REPAIR Right 2014    reports that he has never smoked. He has been exposed to tobacco smoke. He has never used smokeless tobacco. He reports current alcohol use of about 2.0 standard drinks  of alcohol per week. He reports that he does not use drugs. family history includes Alcohol abuse in his father and another family member; Heart disease in an other family member. No Known Allergies  Review of Systems  Constitutional:  Negative for chills and fever.  Respiratory:  Negative for cough, hemoptysis, sputum production and shortness of breath.   Cardiovascular:  Negative for palpitations, leg swelling and PND.  Skin:  Negative for rash.      Objective:     BP 134/60 (BP Location: Right Arm, Patient Position: Sitting, Cuff Size: Normal)   Pulse 90   Temp 97.7 F (36.5 C) (Oral)   Ht '6\' 1"'$  (1.854 m)   Wt 186 lb 3.2 oz (84.5 kg)   SpO2 97%   BMI 24.57 kg/m  BP Readings from Last 3 Encounters:  09/30/22 134/60  12/02/21 (!) 113/57  09/19/21 132/60   Wt Readings from Last 3 Encounters:  09/30/22 186 lb 3.2 oz (84.5 kg)  09/22/22 185 lb (83.9 kg)  12/02/21 190 lb (86.2 kg)      Physical Exam Vitals reviewed.  Constitutional:      Appearance: Normal appearance.  Cardiovascular:     Rate and Rhythm: Normal rate and regular rhythm.  Pulmonary:  Effort: Pulmonary effort is normal.     Breath sounds: Normal breath sounds. No wheezing or rales.  Musculoskeletal:     Right lower leg: No edema.     Left lower leg: No edema.  Skin:    Findings: No rash.  Neurological:     Mental Status: He is alert.      No results found for any visits on 09/30/22.    The 10-year ASCVD risk score (Arnett DK, et al., 2019) is: 26.1%    Assessment & Plan:   Problem List Items Addressed This Visit   None Visit Diagnoses     Left-sided chest wall pain    -  Primary   Relevant Orders   DG Chest 2 View     Presents with left-sided chest wall pain.  Most likely musculoskeletal.  Denies any injury.  This almost follows more of a dermatome by distribution.  He does not have any red flags such as fever, dyspnea, exertional pain, cough, pleuritic pain, appetite change, or  weight loss.  We did discuss possibilities such as shingles without rash yet evident.  He did have old Zostavax but not Shingrix.  -Obtain PA and lateral chest x-ray -Watch closely for any rash or new symptoms -Be in touch if pain not improving or resolving over the next couple weeks  No follow-ups on file.    Carolann Littler, MD

## 2022-09-30 NOTE — Patient Instructions (Signed)
Go to the [facility] facility for CXR.    Follow up for any persistent or worsening pain, rash, fever, increased shortness of breath or other concerns.

## 2022-10-17 ENCOUNTER — Other Ambulatory Visit: Payer: Self-pay | Admitting: Family Medicine

## 2022-11-05 DIAGNOSIS — H401112 Primary open-angle glaucoma, right eye, moderate stage: Secondary | ICD-10-CM | POA: Diagnosis not present

## 2022-11-05 DIAGNOSIS — Z961 Presence of intraocular lens: Secondary | ICD-10-CM | POA: Diagnosis not present

## 2022-11-22 ENCOUNTER — Other Ambulatory Visit: Payer: Self-pay | Admitting: Family Medicine

## 2022-12-23 ENCOUNTER — Other Ambulatory Visit: Payer: Self-pay | Admitting: Family Medicine

## 2023-02-22 ENCOUNTER — Other Ambulatory Visit: Payer: Self-pay | Admitting: Family Medicine

## 2023-03-04 DIAGNOSIS — H401112 Primary open-angle glaucoma, right eye, moderate stage: Secondary | ICD-10-CM | POA: Diagnosis not present

## 2023-03-04 DIAGNOSIS — Z961 Presence of intraocular lens: Secondary | ICD-10-CM | POA: Diagnosis not present

## 2023-03-16 DIAGNOSIS — N403 Nodular prostate with lower urinary tract symptoms: Secondary | ICD-10-CM | POA: Diagnosis not present

## 2023-03-23 DIAGNOSIS — R351 Nocturia: Secondary | ICD-10-CM | POA: Diagnosis not present

## 2023-03-23 DIAGNOSIS — N403 Nodular prostate with lower urinary tract symptoms: Secondary | ICD-10-CM | POA: Diagnosis not present

## 2023-03-23 DIAGNOSIS — N5201 Erectile dysfunction due to arterial insufficiency: Secondary | ICD-10-CM | POA: Diagnosis not present

## 2023-05-22 ENCOUNTER — Other Ambulatory Visit: Payer: Self-pay | Admitting: Family Medicine

## 2023-06-17 ENCOUNTER — Ambulatory Visit: Payer: PPO | Admitting: Adult Health

## 2023-06-17 ENCOUNTER — Encounter: Payer: Self-pay | Admitting: Adult Health

## 2023-06-17 VITALS — BP 140/58 | HR 71 | Temp 97.8°F | Wt 182.0 lb

## 2023-06-17 DIAGNOSIS — R42 Dizziness and giddiness: Secondary | ICD-10-CM | POA: Diagnosis not present

## 2023-06-17 LAB — COMPREHENSIVE METABOLIC PANEL
ALT: 15 U/L (ref 0–53)
AST: 15 U/L (ref 0–37)
Albumin: 4.5 g/dL (ref 3.5–5.2)
Alkaline Phosphatase: 67 U/L (ref 39–117)
BUN: 23 mg/dL (ref 6–23)
CO2: 29 meq/L (ref 19–32)
Calcium: 9.7 mg/dL (ref 8.4–10.5)
Chloride: 105 meq/L (ref 96–112)
Creatinine, Ser: 1.09 mg/dL (ref 0.40–1.50)
GFR: 67.45 mL/min (ref >60.00)
Glucose, Bld: 110 mg/dL — ABNORMAL HIGH (ref 70–99)
Potassium: 4.3 meq/L (ref 3.5–5.1)
Sodium: 139 meq/L (ref 135–145)
Total Bilirubin: 0.5 mg/dL (ref 0.2–1.2)
Total Protein: 6.4 g/dL (ref 6.0–8.3)

## 2023-06-17 LAB — CBC
HCT: 46.4 % (ref 39.0–52.0)
Hemoglobin: 15.7 g/dL (ref 13.0–17.0)
MCHC: 33.9 g/dL (ref 30.0–36.0)
MCV: 88.9 fL (ref 78.0–100.0)
Platelets: 127 10*3/uL — ABNORMAL LOW (ref 150.0–400.0)
RBC: 5.22 Mil/uL (ref 4.22–5.81)
RDW: 15 % (ref 11.5–15.5)
WBC: 4.9 10*3/uL (ref 4.0–10.5)

## 2023-06-17 LAB — TSH: TSH: 2.22 u[IU]/mL (ref 0.35–5.50)

## 2023-06-17 NOTE — Progress Notes (Signed)
Subjective:    Patient ID: Jermaine Brady, male    DOB: 13-Dec-1950, 72 y.o.   MRN: [id number]  HPI 72 year old male, patient of Dr. Caryl Never who I am seeing today for an acute issue.  He reports that starting yesterday he is felt "lightheaded" and " a little off".  His lightheadedness has been present pretty consistently over the last 24 hours but he did notice that it improved when he was laying down in the evening.  He does not have any issues with gait instability due to the lightheadedness.  Denies chest pain, shortness of breath, palpitations, nausea, ear pain, sinus pressure, or abdominal pain.  He does report that a few weeks ago he was in a car and was looking at his phone, when he looked up he developed dizziness like "the world was spinning at 900 miles an hour".  This lasted a few minutes and then resolved.  What he is experiencing now is completely different than what he experienced a few weeks ago.  He did check his blood pressure at home and reports no significant change from baseline.  He has not had any alteration in the amount of fluids he drinks daily.   Review of Systems See HPI   Past Medical History:  Diagnosis Date   ALLERGIC RHINITIS 10/23/2008   Allergy    seasonal   Arthritis    "neck" (05/19/2016)   Cataract    bilateral,removed   DIVERTICULOSIS, COLON 10/23/2008   Heart murmur    Hepatitis C    "don't know when I contracted this; went thru treatment in ~ 2007-2008" ="cured per pt"   History of blood transfusion 2008?   "during tx to cure my Hep C"   History of kidney stones    HYPERTENSION 10/23/2008    Social History   Socioeconomic History   Marital status: Married    Spouse name: Not on file   Number of children: Not on file   Years of education: Not on file   Highest education level: Associate degree: occupational, Scientist, product/process development, or vocational program  Occupational History   Not on file  Tobacco Use   Smoking status: Never    Passive  exposure: Past (as a kid)   Smokeless tobacco: Never  Vaping Use   Vaping status: Never Used  Substance and Sexual Activity   Alcohol use: Yes    Alcohol/week: 2.0 standard drinks of alcohol    Types: 2 Cans of beer per week    Comment: 1-2 x a week   Drug use: No   Sexual activity: Yes  Other Topics Concern   Not on file  Social History Narrative   Not on file   Social Determinants of Health   Financial Resource Strain: Low Risk  (09/28/2022)   Overall Financial Resource Strain (CARDIA)    Difficulty of Paying Living Expenses: Not hard at all  Food Insecurity: No Food Insecurity (09/28/2022)   Hunger Vital Sign    Worried About Running Out of Food in the Last Year: Never true    Ran Out of Food in the Last Year: Never true  Transportation Needs: No Transportation Needs (09/28/2022)   PRAPARE - Administrator, Civil Service (Medical): No    Lack of Transportation (Non-Medical): No  Physical Activity: Sufficiently Active (09/28/2022)   Exercise Vital Sign    Days of Exercise per Week: 6 days    Minutes of Exercise per Session: 30 min  Stress: No Stress Concern  Present (09/28/2022)   [employer] of Occupational Health - Occupational Stress Questionnaire    Feeling of Stress : Not at all  Social Connections: Socially Integrated (09/28/2022)   Social Connection and Isolation Panel [NHANES]    Frequency of Communication with Friends and Family: More than three times a week    Frequency of Social Gatherings with Friends and Family: Once a week    Attends Religious Services: 1 to 4 times per year    Active Member of [employer] or Organizations: Patient declined    Attends Engineer, structural: More than 4 times per year    Marital Status: Married  Catering manager Violence: Not At Risk (09/22/2022)   Humiliation, Afraid, Rape, and Kick questionnaire    Fear of Current or Ex-Partner: No    Emotionally Abused: No    Physically Abused: No    Sexually Abused: No     Past Surgical History:  Procedure Laterality Date   ABDOMINAL HERNIA REPAIR  2005   ASD REPAIR  1971   ASD REPAIR  age 21   CARDIAC CATHETERIZATION  1960s - 63s X 2   "at [facility]"   cataracts Bilateral    COLON SURGERY  2007   ,resection, diverticular rupture   COLONOSCOPY  06/12/2015   HERNIA REPAIR  2005   POLYPECTOMY     SHOULDER ARTHROSCOPY W/ ROTATOR CUFF REPAIR Right 2014    Family History  Problem Relation Age of Onset   Alcohol abuse Father    Alcohol abuse Other    Heart disease Other    Colon cancer Neg Hx    Colon polyps Neg Hx    Esophageal cancer Neg Hx    Rectal cancer Neg Hx    Stomach cancer Neg Hx    Crohn's disease Neg Hx     No Known Allergies  Current Outpatient Medications on File Prior to Visit  Medication Sig Dispense Refill   Cholecalciferol (VITAMIN D3) 1000 UNITS CAPS Take 1,000 Units by mouth at bedtime.      fexofenadine (ALLEGRA) 180 MG tablet Take 180 mg by mouth daily as needed for allergies.     latanoprost (XALATAN) 0.005 % ophthalmic solution 1 drop daily.     lisinopril-hydrochlorothiazide (ZESTORETIC) 20-12.5 MG tablet TAKE 1 TABLET BY MOUTH EVERY DAY 90 tablet 0   naproxen sodium (ALEVE) 220 MG tablet Take 440 mg by mouth 2 (two) times daily as needed (pain/headache).     sildenafil (REVATIO) 20 MG tablet Take 40 mg by mouth daily as needed (erectile dysfunction).     tamsulosin (FLOMAX) 0.4 MG CAPS capsule Take 0.4 mg by mouth at bedtime.     No current facility-administered medications on file prior to visit.    BP (!) 140/58   Pulse 71   Temp 97.8 F (36.6 C) (Oral)   Wt 182 lb (82.6 kg)   SpO2 98%   BMI 24.01 kg/m       Objective:   Physical Exam Vitals and nursing note reviewed.  Constitutional:      Appearance: Normal appearance.  HENT:     Right Ear: Tympanic membrane, ear canal and external ear normal.     Left Ear: Tympanic membrane, ear canal and external ear normal.     Nose: Nose normal. No congestion  or rhinorrhea.  Eyes:     Extraocular Movements: Extraocular movements intact.     Right eye: No nystagmus.     Left eye: No nystagmus.  Conjunctiva/sclera: Conjunctivae normal.     Pupils: Pupils are equal, round, and reactive to light.  Cardiovascular:     Rate and Rhythm: Normal rate and regular rhythm.     Pulses: Normal pulses.     Heart sounds: Normal heart sounds.  Pulmonary:     Effort: Pulmonary effort is normal.     Breath sounds: Normal breath sounds.  Abdominal:     General: Abdomen is flat. Bowel sounds are normal.     Palpations: Abdomen is soft.  Musculoskeletal:        General: Normal range of motion.  Skin:    General: Skin is warm and dry.  Neurological:     General: No focal deficit present.     Mental Status: He is alert and oriented to person, place, and time.  Psychiatric:        Mood and Affect: Mood normal.        Behavior: Behavior normal.        Thought Content: Thought content normal.        Judgment: Judgment normal.       Assessment & Plan:  1. Lightheadedness - Unknown cause but does not seem to be cardiac related. Possible inner ear disorder, vs dehydration vs electrolyte abnormality. Will check labs today. Recommend increasing fluids at home  - EKG 12-Lead; Future- NSR, Rate 68.  - CBC; Future - Comprehensive metabolic panel; Future - TSH; Future   Time spent with patient today was 33 minutes which consisted of chart review, discussing lightheadedness, work up, treatment answering questions and documentation.This did not include time to perform EKG.

## 2023-06-30 ENCOUNTER — Encounter: Payer: Self-pay | Admitting: Adult Health

## 2023-07-14 DIAGNOSIS — H401112 Primary open-angle glaucoma, right eye, moderate stage: Secondary | ICD-10-CM | POA: Diagnosis not present

## 2023-07-14 DIAGNOSIS — Z961 Presence of intraocular lens: Secondary | ICD-10-CM | POA: Diagnosis not present

## 2023-08-19 ENCOUNTER — Other Ambulatory Visit: Payer: Self-pay | Admitting: Family Medicine

## 2023-08-19 ENCOUNTER — Ambulatory Visit (INDEPENDENT_AMBULATORY_CARE_PROVIDER_SITE_OTHER): Payer: PPO | Admitting: Family Medicine

## 2023-08-19 ENCOUNTER — Encounter: Payer: Self-pay | Admitting: Family Medicine

## 2023-08-19 VITALS — BP 130/60 | HR 82 | Temp 97.5°F | Wt 181.5 lb

## 2023-08-19 DIAGNOSIS — R202 Paresthesia of skin: Secondary | ICD-10-CM | POA: Diagnosis not present

## 2023-08-19 DIAGNOSIS — R739 Hyperglycemia, unspecified: Secondary | ICD-10-CM

## 2023-08-19 LAB — HEMOGLOBIN A1C: Hgb A1c MFr Bld: 5.6 % (ref 4.6–6.5)

## 2023-08-19 LAB — VITAMIN B12: Vitamin B-12: 149 pg/mL — ABNORMAL LOW (ref 211–911)

## 2023-08-19 NOTE — Progress Notes (Signed)
Established Patient Office Visit  Subjective   Patient ID: Jermaine Brady, male    DOB: August 19, 1950  Age: 73 y.o. MRN: [id number]  Chief Complaint  Patient presents with   Peripheral Neuropathy    Patient complains of neuropathy, x2 weeks     HPI   Jermaine Brady is seen to evaluate possible neuropathy symptoms in the past couple weeks.  No prior history of neuropathy.  He states 1 night about 2 weeks ago he had fairly severe achy discomfort left great toe.  No erythema or swelling.  No history of gout.  This involved most of the toe.  That pain only lasted 1 night.  Since then he has had some numbness intermittently bilaterally in both feet involving basically all of his toes.  No hand involvement.  He has some intermittent chronic low back pain but no recent new back pain or worsening back pain.  No radiculitis symptoms.  Denies any fever, chills, weight changes.  Rare alcohol use.  He has had some mildly elevated glucose recently but no A1c.  He had TSH, CBC, and CMP with labs back in November.  Thyroid normal at that time.  No known family history of neuropathy.  His chronic problems include hypertension, history of hepatitis C, BPH.  Blood pressures been controlled with lisinopril HCTZ  Past Medical History:  Diagnosis Date   ALLERGIC RHINITIS 10/23/2008   Allergy    seasonal   Arthritis    "neck" (05/19/2016)   Cataract    bilateral,removed   DIVERTICULOSIS, COLON 10/23/2008   Heart murmur    Hepatitis C    "don't know when I contracted this; went thru treatment in ~ 2007-2008" ="cured per pt"   History of blood transfusion 2008?   "during tx to cure my Hep C"   History of kidney stones    HYPERTENSION 10/23/2008   Past Surgical History:  Procedure Laterality Date   ABDOMINAL HERNIA REPAIR  2005   ASD REPAIR  1971   ASD REPAIR  age 26   CARDIAC CATHETERIZATION  1960s - 49s X 2   "at [facility]"   cataracts Bilateral    COLON SURGERY  2007   ,resection, diverticular  rupture   COLONOSCOPY  06/12/2015   HERNIA REPAIR  2005   POLYPECTOMY     SHOULDER ARTHROSCOPY W/ ROTATOR CUFF REPAIR Right 2014    reports that he has never smoked. He has been exposed to tobacco smoke. He has never used smokeless tobacco. He reports current alcohol use of about 2.0 standard drinks of alcohol per week. He reports that he does not use drugs. family history includes Alcohol abuse in his father and another family member; Heart disease in an other family member. No Known Allergies  Review of Systems  Constitutional:  Negative for malaise/fatigue.  Eyes:  Negative for blurred vision.  Respiratory:  Negative for shortness of breath.   Cardiovascular:  Negative for chest pain.  Neurological:  Positive for sensory change. Negative for dizziness, tremors, focal weakness, weakness and headaches.      Objective:     BP 130/60 (BP Location: Left Arm, Patient Position: Sitting, Cuff Size: Normal)   Pulse 82   Temp (!) 97.5 F (36.4 C) (Oral)   Wt 181 lb 8 oz (82.3 kg)   SpO2 98%   BMI 23.95 kg/m  BP Readings from Last 3 Encounters:  08/19/23 130/60  06/17/23 (!) 140/58  09/30/22 134/60   Wt Readings from Last 3 Encounters:  08/19/23 181 lb 8 oz (82.3 kg)  06/17/23 182 lb (82.6 kg)  09/30/22 186 lb 3.2 oz (84.5 kg)      Physical Exam Vitals reviewed.  Constitutional:      Appearance: He is well-developed.  Eyes:     Pupils: Pupils are equal, round, and reactive to light.  Neck:     Thyroid: No thyromegaly.  Cardiovascular:     Rate and Rhythm: Normal rate and regular rhythm.  Pulmonary:     Effort: Pulmonary effort is normal. No respiratory distress.     Breath sounds: Normal breath sounds. No wheezing or rales.  Musculoskeletal:     Cervical back: Neck supple.     Right lower leg: No edema.     Left lower leg: No edema.  Neurological:     General: No focal deficit present.     Mental Status: He is alert and oriented to person, place, and time.      Comments: Romberg normal.  Sensory function intact with monofilament testing both feet.  He has normal sensation with vibration in both feet.    Full strength lower extremities with plantarflexion and dorsiflexion.  2+ ankle and knee reflexes bilaterally.      No results found for any visits on 08/19/23.  Last CBC Lab Results  Component Value Date   WBC 4.9 06/17/2023   HGB 15.7 06/17/2023   HCT 46.4 06/17/2023   MCV 88.9 06/17/2023   MCH 30.1 12/13/2020   RDW 15.0 06/17/2023   PLT 127.0 (L) 06/17/2023   Last metabolic panel Lab Results  Component Value Date   GLUCOSE 110 (H) 06/17/2023   NA 139 06/17/2023   K 4.3 06/17/2023   CL 105 06/17/2023   CO2 29 06/17/2023   BUN 23 06/17/2023   CREATININE 1.09 06/17/2023   GFR 67.45 06/17/2023   CALCIUM 9.7 06/17/2023   PROT 6.4 06/17/2023   ALBUMIN 4.5 06/17/2023   BILITOT 0.5 06/17/2023   ALKPHOS 67 06/17/2023   AST 15 06/17/2023   ALT 15 06/17/2023   ANIONGAP 7 12/13/2020   Last thyroid functions Lab Results  Component Value Date   TSH 2.22 06/17/2023      The ASCVD Risk score (Arnett DK, et al., 2019) failed to calculate for the following reasons:   Cannot find a previous HDL lab   Cannot find a previous total cholesterol lab    Assessment & Plan:   Problem List Items Addressed This Visit   None Visit Diagnoses       Paresthesia of both feet    -  Primary   Relevant Orders   Hemoglobin A1c   Vitamin B12   Multiple Myeloma Panel (SPEP&IFE w/QIG)     73 year old male who presents with 2-week history of intermittent bilateral sensory impairment both feet.  He had 1 day of left great toe pain but none since then.  No motor weakness.  No prior history of neuropathy.  Etiology unclear.  Recent TSH normal.  He has had some mild hyperglycemia on previous labs but no recent A1c.  He does not drink much alcohol.  No known family history of neuropathy.  No known recent heavy metal exposures.  -Check further labs  with A1c, B12, myeloma panel.  Recent TSH normal. -Be in touch if he has any recurrent neuropathic pain symptoms and we can consider gabapentin or Lyrica -If above unrevealing, consider neurology referral  No follow-ups on file.    Evelena Peat, MD

## 2023-08-21 ENCOUNTER — Ambulatory Visit (INDEPENDENT_AMBULATORY_CARE_PROVIDER_SITE_OTHER): Payer: PPO

## 2023-08-21 DIAGNOSIS — E538 Deficiency of other specified B group vitamins: Secondary | ICD-10-CM

## 2023-08-21 MED ORDER — CYANOCOBALAMIN 1000 MCG/ML IJ SOLN
1000.0000 ug | Freq: Once | INTRAMUSCULAR | Status: AC
  Administered 2023-08-21: 1000 ug via INTRAMUSCULAR

## 2023-08-21 NOTE — Progress Notes (Signed)
Per orders of Dr. Caryl Never, injection of B12 given by Vickii Chafe on Left Deltoid. Patient tolerated injection well.

## 2023-08-25 ENCOUNTER — Encounter: Payer: Self-pay | Admitting: Family Medicine

## 2023-08-25 LAB — MULTIPLE MYELOMA PANEL, SERUM
Albumin SerPl Elph-Mcnc: 4 g/dL (ref 2.9–4.4)
Albumin/Glob SerPl: 1.7 (ref 0.7–1.7)
Alpha 1: 0.2 g/dL (ref 0.0–0.4)
Alpha2 Glob SerPl Elph-Mcnc: 0.7 g/dL (ref 0.4–1.0)
B-Globulin SerPl Elph-Mcnc: 0.8 g/dL (ref 0.7–1.3)
Gamma Glob SerPl Elph-Mcnc: 0.7 g/dL (ref 0.4–1.8)
Globulin, Total: 2.4 g/dL (ref 2.2–3.9)
IgA/Immunoglobulin A, Serum: 97 mg/dL (ref 61–437)
IgG (Immunoglobin G), Serum: 690 mg/dL (ref 603–1613)
IgM (Immunoglobulin M), Srm: 43 mg/dL (ref 15–143)
Total Protein: 6.4 g/dL (ref 6.0–8.5)

## 2023-08-26 ENCOUNTER — Ambulatory Visit: Payer: Self-pay | Admitting: Family Medicine

## 2023-08-26 NOTE — Telephone Encounter (Signed)
Copied from CRM [id number]. Topic: Clinical - Lab/Test Results >> Aug 26, 2023 11:44 AM Drema Balzarine wrote: Reason for CRM: Patient would like to discuss lab results he says they look serious   Patient called and wanted to know what the recent test result was in his labwork referring to cancer.   Answer Assessment - Initial Assessment Questions 1. REASON FOR CALL or QUESTION: "What is your reason for calling today?" or "How can I best help you?" or "What question do you have that I can help answer?"     Patient wanted to speak to someone about his test result  Protocols used: Information Only Call - No Triage-A-AH

## 2023-08-26 NOTE — Telephone Encounter (Signed)
Patient informed of the results and voiced understanding

## 2023-08-26 NOTE — Telephone Encounter (Signed)
Called CAL at PCP's office and advised them that the patient was calling about his test results.

## 2023-08-28 ENCOUNTER — Ambulatory Visit (INDEPENDENT_AMBULATORY_CARE_PROVIDER_SITE_OTHER): Payer: PPO

## 2023-08-28 DIAGNOSIS — E538 Deficiency of other specified B group vitamins: Secondary | ICD-10-CM | POA: Diagnosis not present

## 2023-08-28 MED ORDER — CYANOCOBALAMIN 1000 MCG/ML IJ SOLN
1000.0000 ug | Freq: Once | INTRAMUSCULAR | Status: AC
  Administered 2023-08-28: 1000 ug via INTRAMUSCULAR

## 2023-08-28 NOTE — Progress Notes (Signed)
 Per orders of Dr. Clent Ridges, injection of B12 given by Vickii Chafe on Left Deltoid. Patient tolerated injection well.

## 2023-09-04 ENCOUNTER — Ambulatory Visit (INDEPENDENT_AMBULATORY_CARE_PROVIDER_SITE_OTHER): Payer: PPO

## 2023-09-04 DIAGNOSIS — E538 Deficiency of other specified B group vitamins: Secondary | ICD-10-CM | POA: Diagnosis not present

## 2023-09-04 MED ORDER — CYANOCOBALAMIN 1000 MCG/ML IJ SOLN
1000.0000 ug | Freq: Once | INTRAMUSCULAR | Status: AC
  Administered 2023-09-04: 1000 ug via INTRAMUSCULAR

## 2023-09-04 NOTE — Progress Notes (Signed)
 Per orders of Dr. Caryl Never, injection of Cyanocobalamin 1000 mcg given by Trudie Cervantes L Pesach Frisch. Patient tolerated injection well.

## 2023-09-11 ENCOUNTER — Ambulatory Visit: Payer: PPO

## 2023-09-11 DIAGNOSIS — E538 Deficiency of other specified B group vitamins: Secondary | ICD-10-CM

## 2023-09-11 MED ORDER — CYANOCOBALAMIN 1000 MCG/ML IJ SOLN
1000.0000 ug | Freq: Once | INTRAMUSCULAR | Status: AC
  Administered 2023-09-11: 1000 ug via INTRAMUSCULAR

## 2023-09-11 NOTE — Progress Notes (Signed)
Per orders of Dr. Caryl Never, injection of Cyanocobalamin 1000mg  given by Vickii Chafe on Left Deltoid. Patient tolerated injection well.

## 2023-10-02 ENCOUNTER — Ambulatory Visit: Payer: PPO

## 2023-10-02 VITALS — Ht 73.0 in | Wt 181.0 lb

## 2023-10-02 DIAGNOSIS — Z0000 Encounter for general adult medical examination without abnormal findings: Secondary | ICD-10-CM

## 2023-10-02 NOTE — Patient Instructions (Addendum)
 Mr. Jermaine Brady , Thank you for taking time to come for your Medicare Wellness Visit. I appreciate your ongoing commitment to your health goals. Please review the following plan we discussed and let me know if I can assist you in the future.   Referrals/Orders/Follow-Ups/Clinician Recommendations:   This is a list of the screening recommended for you and due dates:  Health Maintenance  Topic Date Due   Zoster (Shingles) Vaccine (1 of 2) 03/18/2000   Medicare Annual Wellness Visit  10/01/2024   DTaP/Tdap/Td vaccine (3 - Td or Tdap) 12/21/2024   Colon Cancer Screening  12/03/2026   Pneumonia Vaccine  Completed   Flu Shot  Completed   COVID-19 Vaccine  Completed   Hepatitis C Screening  Completed   HPV Vaccine  Aged Out    Advanced directives: (Copy Requested) Please bring a copy of your health care power of attorney and living will to the office to be added to your chart at your convenience.  Next Medicare Annual Wellness Visit scheduled for next year: Yes

## 2023-10-02 NOTE — Progress Notes (Signed)
 Subjective:   Jermaine Brady is a 73 y.o. male who presents for Medicare Annual/Subsequent preventive examination.  Visit Complete: Virtual I connected with  Dominic Pea on 10/02/23 by a video and audio enabled telemedicine application and verified that I am speaking with the correct person using two identifiers.  Patient Location: Home  Provider Location: Home Office  I discussed the limitations of evaluation and management by telemedicine. The patient expressed understanding and agreed to proceed.  Vital Signs: Because this visit was a virtual/telehealth visit, some criteria may be missing or patient reported. Any vitals not documented were not able to be obtained and vitals that have been documented are patient reported.  Patient Medicare AWV questionnaire was completed by the patient on 10/01/23; I have confirmed that all information answered by patient is correct and no changes since this date.  Cardiac Risk Factors include: advanced age (>20men, >28 women);male gender;hypertension     Objective:    Today's Vitals   10/02/23 0840  Weight: 181 lb (82.1 kg)  Height: 6\' 1"  (1.854 m)   Body mass index is 23.88 kg/m.     10/02/2023    8:46 AM 09/22/2022    9:16 AM 09/19/2021    9:14 AM 10/09/2020    3:35 PM 10/10/2019    8:09 AM 05/19/2016    9:29 AM 05/29/2015    8:19 AM  Advanced Directives  Does Patient Have a Medical Advance Directive? Yes Yes Yes Yes No No Yes  Type of Estate agent of [place];Living will Healthcare Power of [place];Living will Healthcare Power of [place];Living will Healthcare Power of [place];Living will   Healthcare Power of [place];Living will  Does patient want to make changes to medical advance directive?   No - Patient declined No - Patient declined   No - Patient declined  Copy of Healthcare Power of Attorney in Chart? No - copy requested No - copy requested No - copy requested No - copy requested   No - copy requested   Would patient like information on creating a medical advance directive?     No - Patient declined No - patient declined information     Current Medications (verified) Outpatient Encounter Medications as of 10/02/2023  Medication Sig   Cholecalciferol (VITAMIN D3) 1000 UNITS CAPS Take 1,000 Units by mouth at bedtime.    fexofenadine (ALLEGRA) 180 MG tablet Take 180 mg by mouth daily as needed for allergies.   latanoprost (XALATAN) 0.005 % ophthalmic solution 1 drop daily.   lisinopril-hydrochlorothiazide (ZESTORETIC) 20-12.5 MG tablet TAKE 1 TABLET BY MOUTH EVERY DAY   naproxen sodium (ALEVE) 220 MG tablet Take 440 mg by mouth 2 (two) times daily as needed (pain/headache).   sildenafil (REVATIO) 20 MG tablet Take 40 mg by mouth daily as needed (erectile dysfunction).   tamsulosin (FLOMAX) 0.4 MG CAPS capsule Take 0.4 mg by mouth at bedtime.   No facility-administered encounter medications on file as of 10/02/2023.    Allergies (verified) Patient has no known allergies.   History: Past Medical History:  Diagnosis Date   ALLERGIC RHINITIS 10/23/2008   Allergy    seasonal   Arthritis    "neck" (05/19/2016)   Cataract    bilateral,removed   DIVERTICULOSIS, COLON 10/23/2008   Heart murmur    Hepatitis C    "don't know when I contracted this; went thru treatment in ~ 2007-2008" ="cured per pt"   History of blood transfusion 2008?   "during tx to cure my  Hep C"   History of kidney stones    HYPERTENSION 10/23/2008   Past Surgical History:  Procedure Laterality Date   ABDOMINAL HERNIA REPAIR  2005   ASD REPAIR  1971   ASD REPAIR  age 87   CARDIAC CATHETERIZATION  1960s - 58s X 2   "at [facility]"   cataracts Bilateral    COLON SURGERY  2007   ,resection, diverticular rupture   COLONOSCOPY  06/12/2015   HERNIA REPAIR  2005   POLYPECTOMY     SHOULDER ARTHROSCOPY W/ ROTATOR CUFF REPAIR Right 2014   Family History  Problem Relation Age of Onset   Alcohol abuse Father    Alcohol  abuse Other    Heart disease Other    Colon cancer Neg Hx    Colon polyps Neg Hx    Esophageal cancer Neg Hx    Rectal cancer Neg Hx    Stomach cancer Neg Hx    Crohn's disease Neg Hx    Social History   Socioeconomic History   Marital status: Married    Spouse name: Not on file   Number of children: Not on file   Years of education: Not on file   Highest education level: Associate degree: occupational, Scientist, product/process development, or vocational program  Occupational History   Not on file  Tobacco Use   Smoking status: Never    Passive exposure: Past (as a kid)   Smokeless tobacco: Never  Vaping Use   Vaping status: Never Used  Substance and Sexual Activity   Alcohol use: Yes    Alcohol/week: 2.0 standard drinks of alcohol    Types: 2 Cans of beer per week    Comment: 1-2 x a week   Drug use: No   Sexual activity: Yes  Other Topics Concern   Not on file  Social History Narrative   Not on file   Social Drivers of Health   Financial Resource Strain: Low Risk  (10/02/2023)   Overall Financial Resource Strain (CARDIA)    Difficulty of Paying Living Expenses: Not hard at all  Food Insecurity: No Food Insecurity (10/02/2023)   Hunger Vital Sign    Worried About Running Out of Food in the Last Year: Never true    Ran Out of Food in the Last Year: Never true  Transportation Needs: No Transportation Needs (10/02/2023)   PRAPARE - Administrator, Civil Service (Medical): No    Lack of Transportation (Non-Medical): No  Physical Activity: Sufficiently Active (10/02/2023)   Exercise Vital Sign    Days of Exercise per Week: 5 days    Minutes of Exercise per Session: 60 min  Recent Concern: Physical Activity - Insufficiently Active (08/18/2023)   Exercise Vital Sign    Days of Exercise per Week: 5 days    Minutes of Exercise per Session: 20 min  Stress: No Stress Concern Present (10/02/2023)   [employer] of Occupational Health - Occupational Stress Questionnaire    Feeling of Stress  : Not at all  Social Connections: Socially Integrated (10/02/2023)   Social Connection and Isolation Panel [NHANES]    Frequency of Communication with Friends and Family: More than three times a week    Frequency of Social Gatherings with Friends and Family: More than three times a week    Attends Religious Services: More than 4 times per year    Active Member of [employer] or Organizations: Yes    Attends Banker Meetings: More than 4 times per  year    Marital Status: Married    Tobacco Counseling Counseling given: Not Answered   Clinical Intake:  Pre-visit preparation completed: Yes  Pain : No/denies pain     BMI - recorded: 23.88 Nutritional Status: BMI of 19-24  Normal Nutritional Risks: None Diabetes: No  How often do you need to have someone help you when you read instructions, pamphlets, or other written materials from your doctor or pharmacy?: 1 - Never  Interpreter Needed?: No  Information entered by :: Theresa Mulligan PN   Activities of Daily Living    10/02/2023    8:46 AM 10/01/2023    6:25 PM  In your present state of health, do you have any difficulty performing the following activities:  Hearing? 1 0  Comment Wears Hearing Aids   Vision? 0 0  Difficulty concentrating or making decisions? 0 0  Walking or climbing stairs? 0 0  Dressing or bathing? 0 0  Doing errands, shopping? 0 0  Preparing Food and eating ? N N  Using the Toilet? N N  In the past six months, have you accidently leaked urine? N N  Do you have problems with loss of bowel control? N N  Managing your Medications? N N  Managing your Finances? N N  Housekeeping or managing your Housekeeping? N N    Patient Care Team: Kristian Covey, MD as PCP - General Katrinka Blazing Barry Dienes, MD (Inactive) as PCP - Cardiology (Cardiology)  Indicate any recent Medical Services you may have received from other than [facility] providers in the past year (date may be approximate).     Assessment:   This is  a routine wellness examination for [place].  Hearing/Vision screen Hearing Screening - Comments:: Wears Hearing Aids Vision Screening - Comments:: Wears rx glasses - up to date with routine eye exams with  Dr Dione Booze   Goals Addressed               This Visit's Progress     Remain Active (pt-stated)         Depression Screen    10/02/2023    8:45 AM 09/22/2022    9:14 AM 09/19/2021    9:09 AM 10/09/2020    3:37 PM 09/02/2019    4:17 PM 06/07/2018   10:17 AM 06/03/2016    1:42 PM  PHQ 2/9 Scores  PHQ - 2 Score 0 0 0 0 0 0 0  PHQ- 9 Score     0      Fall Risk    10/02/2023    8:46 AM 10/01/2023    6:25 PM 09/28/2022   11:34 AM 09/22/2022    9:16 AM 09/19/2021    9:13 AM  Fall Risk   Falls in the past year? 0 0 0 0 0  Number falls in past yr: 0 0  0 0  Injury with Fall? 0 0  0 0  Risk for fall due to : No Fall Risks   No Fall Risks No Fall Risks  Follow up Falls prevention discussed;Falls evaluation completed   Falls prevention discussed     MEDICARE RISK AT HOME: Medicare Risk at Home Any stairs in or around the home?: Yes If so, are there any without handrails?: No Home free of loose throw rugs in walkways, pet beds, electrical cords, etc?: Yes Adequate lighting in your home to reduce risk of falls?: Yes Life alert?: No Use of a cane, walker or w/c?: No Grab bars in the bathroom?:  No Shower chair or bench in shower?: No Elevated toilet seat or a handicapped toilet?: Yes  TIMED UP AND GO:  Was the test performed?  No    Cognitive Function:        10/02/2023    8:47 AM 09/22/2022    9:16 AM 09/19/2021    9:14 AM  6CIT Screen  What Year? 0 points 0 points 0 points  What month? 0 points 0 points 0 points  What time? 0 points 0 points 0 points  Count back from 20 0 points 0 points 0 points  Months in reverse 0 points 0 points 0 points  Repeat phrase 0 points 0 points 0 points  Total Score 0 points 0 points 0 points    Immunizations Immunization History   Administered Date(s) Administered   Fluad Quad(high Dose 65+) 05/09/2019, 05/26/2021, 05/12/2022   Fluad Trivalent(High Dose 65+) 05/24/2023   Influenza, High Dose Seasonal PF 08/21/2017, 06/07/2018, 05/13/2020   Influenza,inj,Quad PF,6+ Mos 06/07/2013, 07/31/2014, 05/20/2016   Influenza,trivalent, recombinat, inj, PF 06/05/2014   Moderna Covid-19 Fall Seasonal Vaccine 70yrs & older 08/16/2023   PFIZER(Purple Top)SARS-COV-2 Vaccination 09/03/2019, 09/28/2019, 06/02/2021   Pneumococcal Conjugate-13 08/21/2017   Pneumococcal Polysaccharide-23 05/20/2016   Td 07/28/2004   Tdap 12/22/2014   Zoster, Live 10/25/2012    TDAP status: Up to date  Flu Vaccine status: Up to date  Pneumococcal vaccine status: Up to date  Covid-19 vaccine status: Completed vaccines  Qualifies for Shingles Vaccine? Yes   Zostavax completed No   Shingrix Completed?: No.    Education has been provided regarding the importance of this vaccine. Patient has been advised to call insurance company to determine out of pocket expense if they have not yet received this vaccine. Advised may also receive vaccine at local pharmacy or Health Dept. Verbalized acceptance and understanding.  Screening Tests Health Maintenance  Topic Date Due   Zoster Vaccines- Shingrix (1 of 2) 03/18/2000   Medicare Annual Wellness (AWV)  10/01/2024   DTaP/Tdap/Td (3 - Td or Tdap) 12/21/2024   Colonoscopy  12/03/2026   Pneumonia Vaccine 55+ Years old  Completed   INFLUENZA VACCINE  Completed   COVID-19 Vaccine  Completed   Hepatitis C Screening  Completed   HPV VACCINES  Aged Out    Health Maintenance  Health Maintenance Due  Topic Date Due   Zoster Vaccines- Shingrix (1 of 2) 03/18/2000    Colorectal cancer screening: Type of screening: Colonoscopy. Completed 12/02/21. Repeat every 5 years     Additional Screening:  Hepatitis C Screening: does qualify; Completed 11/06/10  Vision Screening: Recommended annual ophthalmology  exams for early detection of glaucoma and other disorders of the eye. Is the patient up to date with their annual eye exam?  Yes  Who is the provider or what is the name of the office in which the patient attends annual eye exams? Dr Dione Booze If pt is not established with a provider, would they like to be referred to a provider to establish care? No .   Dental Screening: Recommended annual dental exams for proper oral hygiene    Community Resource Referral / Chronic Care Management:  CRR required this visit?  No   CCM required this visit?  No     Plan:     I have personally reviewed and noted the following in the patient's chart:   Medical and social history Use of alcohol, tobacco or illicit drugs  Current medications and supplements including opioid prescriptions. Patient is  not currently taking opioid prescriptions. Functional ability and status Nutritional status Physical activity Advanced directives List of other physicians Hospitalizations, surgeries, and ER visits in previous 12 months Vitals Screenings to include cognitive, depression, and falls Referrals and appointments  In addition, I have reviewed and discussed with patient certain preventive protocols, quality metrics, and best practice recommendations. A written personalized care plan for preventive services as well as general preventive health recommendations were provided to patient.     Tillie Rung, LPN   03/30/7168   After Visit Summary: (MyChart) Due to this being a telephonic visit, the after visit summary with patients personalized plan was offered to patient via MyChart   Nurse Notes: None

## 2023-11-19 ENCOUNTER — Other Ambulatory Visit: Payer: Self-pay | Admitting: Family Medicine

## 2023-11-20 ENCOUNTER — Encounter: Payer: Self-pay | Admitting: Family Medicine

## 2023-11-20 ENCOUNTER — Ambulatory Visit: Payer: PPO | Admitting: Family Medicine

## 2023-11-20 VITALS — BP 128/60 | HR 73 | Temp 97.6°F | Wt 179.2 lb

## 2023-11-20 DIAGNOSIS — E538 Deficiency of other specified B group vitamins: Secondary | ICD-10-CM

## 2023-11-20 LAB — VITAMIN B12: Vitamin B-12: 645 pg/mL (ref 211–911)

## 2023-11-20 NOTE — Progress Notes (Signed)
 Established Patient Office Visit  Subjective   Patient ID: Jermaine Brady, male    DOB: 1950-12-09  Age: 73 y.o. MRN: [id number]  Chief Complaint  Patient presents with   Medical Management of Chronic Issues    HPI   Huel is here for follow-up regarding B12 deficiency.  He had some neuropathy type symptoms and we checked labs back in January.  He states his feet feel cold frequently.  Myeloma panel and A1c came back normal.  B12 was 149.  He received B12 IM x 4 and is now taking oral daily supplement.  Still has some cold feet especially at night.  No painful neuropathy symptoms.  No anemia.  Chronic slightly low platelets.  No recent macrocytosis  He has history of recurrent cerumen impaction.  He has scheduled audiology assessment in a week.  Requesting ears to be checked.  Past Medical History:  Diagnosis Date   ALLERGIC RHINITIS 10/23/2008   Allergy    seasonal   Arthritis    "neck" (05/19/2016)   Cataract    bilateral,removed   DIVERTICULOSIS, COLON 10/23/2008   Heart murmur    Hepatitis C    "don't know when I contracted this; went thru treatment in ~ 2007-2008" ="cured per pt"   History of blood transfusion 2008?   "during tx to cure my Hep C"   History of kidney stones    HYPERTENSION 10/23/2008   Past Surgical History:  Procedure Laterality Date   ABDOMINAL HERNIA REPAIR  2005   ASD REPAIR  1971   ASD REPAIR  age 75   CARDIAC CATHETERIZATION  1960s - 69s X 2   "at [facility]"   cataracts Bilateral    COLON SURGERY  2007   ,resection, diverticular rupture   COLONOSCOPY  06/12/2015   HERNIA REPAIR  2005   POLYPECTOMY     SHOULDER ARTHROSCOPY W/ ROTATOR CUFF REPAIR Right 2014    reports that he has never smoked. He has been exposed to tobacco smoke. He has never used smokeless tobacco. He reports current alcohol use of about 2.0 standard drinks of alcohol per week. He reports that he does not use drugs. family history includes Alcohol abuse in his father  and another family member; Heart disease in an other family member. No Known Allergies  Review of Systems  Respiratory:  Negative for shortness of breath.   Cardiovascular:  Negative for chest pain.      Objective:     BP 128/60 (BP Location: Left Arm, Patient Position: Sitting, Cuff Size: Normal)   Pulse 73   Temp 97.6 F (36.4 C) (Oral)   Wt 179 lb 3.2 oz (81.3 kg)   SpO2 95%   BMI 23.64 kg/m  BP Readings from Last 3 Encounters:  11/20/23 128/60  08/19/23 130/60  06/17/23 (!) 140/58   Wt Readings from Last 3 Encounters:  11/20/23 179 lb 3.2 oz (81.3 kg)  10/02/23 181 lb (82.1 kg)  08/19/23 181 lb 8 oz (82.3 kg)      Physical Exam Vitals reviewed.  HENT:     Ears:     Comments: No significant cerumen in left canal.  He does have cerumen impaction right canal.  This is very dense firm wax which is deep in the canal Cardiovascular:     Rate and Rhythm: Normal rate and regular rhythm.  Pulmonary:     Effort: Pulmonary effort is normal.     Breath sounds: Normal breath sounds. No wheezing or rales.  Neurological:     Mental Status: He is alert.      No results found for any visits on 11/20/23.  Last vitamin B12 and Folate Lab Results  Component Value Date   VITAMINB12 149 (L) 08/19/2023      The ASCVD Risk score (Arnett DK, et al., 2019) failed to calculate for the following reasons:   Cannot find a previous HDL lab   Cannot find a previous total cholesterol lab    Assessment & Plan:   Problem List Items Addressed This Visit   None Visit Diagnoses       B12 deficiency    -  Primary   Relevant Orders   Vitamin B12     B12 deficiency with recent B12 149.  He received IM injections x 4.  Now on oral replacement.  Check B12 level today.  Continue daily oral replacement with 500 to 1000 mcg daily.  Cerumen impaction right canal.  We attempted removal with curette but he had very firm hardened wax and unable to remove.  We attempted irrigation but he  has some discomfort.  We recommended earwax softener with Cerumenex or Debrox daily for about 30 minutes to an hour followed by gentle irrigation with rubber bulb syringe.  No follow-ups on file.    Glean Lamy, MD

## 2023-11-20 NOTE — Patient Instructions (Signed)
 Try the Debrox or Cerumenex wax softener and leave in 30 minutes to one hour and then irrigate gently with rubber bulb syringe.   We will call with the B12 level.

## 2023-11-23 ENCOUNTER — Ambulatory Visit: Payer: Self-pay

## 2023-11-23 ENCOUNTER — Ambulatory Visit (INDEPENDENT_AMBULATORY_CARE_PROVIDER_SITE_OTHER): Admitting: Family Medicine

## 2023-11-23 ENCOUNTER — Encounter: Payer: Self-pay | Admitting: Family Medicine

## 2023-11-23 VITALS — BP 118/60 | HR 73 | Temp 97.7°F | Wt 180.2 lb

## 2023-11-23 DIAGNOSIS — H6121 Impacted cerumen, right ear: Secondary | ICD-10-CM | POA: Diagnosis not present

## 2023-11-23 NOTE — Telephone Encounter (Signed)
  Chief Complaint: ear wax impaction Symptoms: Full sensation in ear, decreased hearing Frequency: constant Pertinent Negatives: Patient denies new symptoms Disposition: [] ED /[] Urgent Care (no appt availability in office) / [x] Appointment(In office/virtual)/ []  [facility]/ [] Home Care/ [] Refused Recommended Disposition /[] [facility]/ []  Follow-up with PCP Additional Notes:  Friday evaluated by Dr. Nadara Auerbach for unrelated follow up. During that visit he asked dr. Darren Em to exam his ears for wax because he has audiology appointment on Wednesday and they will turn him away if he has impaction, as they have in the past. Left ear was clear, right side ear wax irrigated. Calling today because he has been having decreased hearing from right ear since Friday, feels like was is deep in canal, he has been using home treatments without effect. Requesting appointment today for further irrigation/evaluation. No acute visits available with PCP today, scheduled acute with an alternate provider. Educated on care advice as documented in protocol, patient verbalized understanding.    Copied from CRM [id number]. Topic: Clinical - Medical Advice >> Nov 23, 2023  8:17 AM Jenice Mitts wrote: Reason for CRM: Patient is calling in because he would like a call on what he should do. He has wax stuck in one of his ears that he has been unable to get out and he is having trouble hearing   11/23/23 1:55pm- 1st attempt, no answer, LVMTCB [phone]    Reason for Disposition  [1] Ear pain after ear syringing (i.e., squirting liquid into ear canal to remove wax) AND [2] persists > 1 hour  Protocols used: Earwax-A-AH

## 2023-11-23 NOTE — Progress Notes (Signed)
   Subjective:    Patient ID: Jermaine Brady, male    DOB: 06-Oct-1950, 73 y.o.   MRN: [id number]  HPI Here to follow up on a cerumen impaction in the right ear canal. There is no pain. He saw Dr. Darren Em on 11-20-23, and they attempted to irrigate this. However this was not successful, so they sent him home to treat this with Debrox and irrigation every day. He has done this. He wears bilateral hearing aids.    Review of Systems  Constitutional: Negative.   HENT:  Positive for hearing loss. Negative for ear discharge and ear pain.   Eyes: Negative.   Respiratory: Negative.         Objective:   Physical Exam Constitutional:      Appearance: Normal appearance.  HENT:     Right Ear: There is impacted cerumen.     Left Ear: Tympanic membrane, ear canal and external ear normal.  Cardiovascular:     Rate and Rhythm: Normal rate.     Pulses: Normal pulses.     Heart sounds: Normal heart sounds.  Pulmonary:     Effort: Pulmonary effort is normal.     Breath sounds: Normal breath sounds.  Neurological:     Mental Status: He is alert.           Assessment & Plan:  Cerumen impaction. After informed consent was obtained, we attempted to irrigate the right ear canal again today. Her tolerated the procedure well. Unfortunately we were not able to remove the impaction, so we will refer him to ENT to remove this.  Corita Diego, MD

## 2023-11-23 NOTE — Telephone Encounter (Signed)
 Copied from CRM [id number]. Topic: Clinical - Medical Advice >> Nov 23, 2023  8:17 AM Jenice Mitts wrote: Reason for CRM: Patient is calling in because he would like a call on what he should do. He has wax stuck in one of his ears that he has been unable to get out and he is having trouble hearing  11/23/23 1:55pm- 1st attempt, no answer, LVMTCB [phone]

## 2023-11-24 DIAGNOSIS — Z961 Presence of intraocular lens: Secondary | ICD-10-CM | POA: Diagnosis not present

## 2023-11-24 DIAGNOSIS — H401112 Primary open-angle glaucoma, right eye, moderate stage: Secondary | ICD-10-CM | POA: Diagnosis not present

## 2023-11-25 DIAGNOSIS — H903 Sensorineural hearing loss, bilateral: Secondary | ICD-10-CM | POA: Diagnosis not present

## 2023-12-14 NOTE — Telephone Encounter (Signed)
 Copied from CRM [id number]. Topic: Clinical - Medical Advice >> Nov 23, 2023  2:05 PM Tiffany S wrote: Reason for CRM: Patient was returning your call please follow up with patient  Duplicate

## 2023-12-15 ENCOUNTER — Encounter (INDEPENDENT_AMBULATORY_CARE_PROVIDER_SITE_OTHER): Payer: Self-pay | Admitting: Physician Assistant

## 2023-12-15 ENCOUNTER — Ambulatory Visit (INDEPENDENT_AMBULATORY_CARE_PROVIDER_SITE_OTHER): Admitting: Physician Assistant

## 2023-12-15 VITALS — BP 144/72 | HR 62 | Ht 73.0 in | Wt 185.0 lb

## 2023-12-15 DIAGNOSIS — H6121 Impacted cerumen, right ear: Secondary | ICD-10-CM | POA: Diagnosis not present

## 2023-12-15 NOTE — Progress Notes (Signed)
 Dear Dr. Alyne Babinski, Here is my assessment for our mutual patient, Jermaine Brady. Thank you for allowing me the opportunity to care for your patient. Please do not hesitate to contact me should you have any other questions. Sincerely, Belma Boxer PA-C  Otolaryngology Clinic Note Referring provider: Dr. Alyne Babinski HPI:  Jermaine Brady is a 73 y.o. male kindly referred by Dr. Alyne Babinski   The patient is a 73 year old gentleman seen our office for evaluation of cerumen impaction.  The patient notes that he went to his primary care provider for routine visit.  He was getting ready to have his hearing aids adjusted and wanted the provider to look in his ears.  He noted that he had cerumen impaction on the right.  Several attempts were made at the primary care office to remove the impaction that were unsuccessful.  Patient denies any significant hearing changes, denies any pain or drainage.  He has had problems with wax previously.  He notes he [facility] and will be following up with them after today's visit.  He has no other concerns today.     Independent Review of Additional Tests or Records:  none   PMH/Meds/All/SocHx/FamHx/ROS:   Past Medical History:  Diagnosis Date   ALLERGIC RHINITIS 10/23/2008   Allergy    seasonal   Arthritis    "neck" (05/19/2016)   Cataract    bilateral,removed   DIVERTICULOSIS, COLON 10/23/2008   Heart murmur    Hepatitis C    "don't know when I contracted this; went thru treatment in ~ 2007-2008" ="cured per pt"   History of blood transfusion 2008?   "during tx to cure my Hep C"   History of kidney stones    HYPERTENSION 10/23/2008     Past Surgical History:  Procedure Laterality Date   ABDOMINAL HERNIA REPAIR  2005   ASD REPAIR  1971   ASD REPAIR  age 78   CARDIAC CATHETERIZATION  1960s - 101s X 2   "at [facility]"   cataracts Bilateral    COLON SURGERY  2007   ,resection, diverticular rupture   COLONOSCOPY  06/12/2015    HERNIA REPAIR  2005   POLYPECTOMY     SHOULDER ARTHROSCOPY W/ ROTATOR CUFF REPAIR Right 2014    Family History  Problem Relation Age of Onset   Alcohol abuse Father    Alcohol abuse Other    Heart disease Other    Colon cancer Neg Hx    Colon polyps Neg Hx    Esophageal cancer Neg Hx    Rectal cancer Neg Hx    Stomach cancer Neg Hx    Crohn's disease Neg Hx      Social Connections: Socially Integrated (10/02/2023)   Social Connection and Isolation Panel [NHANES]    Frequency of Communication with Friends and Family: More than three times a week    Frequency of Social Gatherings with Friends and Family: More than three times a week    Attends Religious Services: More than 4 times per year    Active Member of Clubs or Organizations: Yes    Attends Engineer, structural: More than 4 times per year    Marital Status: Married      Current Outpatient Medications:    Cholecalciferol (VITAMIN D3) 1000 UNITS CAPS, Take 1,000 Units by mouth at bedtime. , Disp: , Rfl:    fexofenadine (ALLEGRA) 180 MG tablet, Take 180 mg by mouth daily as needed for allergies., Disp: ,  Rfl:    latanoprost (XALATAN) 0.005 % ophthalmic solution, 1 drop daily., Disp: , Rfl:    lisinopril -hydrochlorothiazide  (ZESTORETIC ) 20-12.5 MG tablet, TAKE 1 TABLET BY MOUTH EVERY DAY, Disp: 90 tablet, Rfl: 0   naproxen sodium (ALEVE) 220 MG tablet, Take 440 mg by mouth 2 (two) times daily as needed (pain/headache)., Disp: , Rfl:    sildenafil (REVATIO) 20 MG tablet, Take 40 mg by mouth daily as needed (erectile dysfunction)., Disp: , Rfl:    tamsulosin  (FLOMAX ) 0.4 MG CAPS capsule, Take 0.4 mg by mouth at bedtime., Disp: , Rfl:    Physical Exam:   BP (!) 144/72   Pulse 62   Ht 6\' 1"  (1.854 m)   Wt 185 lb (83.9 kg)   SpO2 97%   BMI 24.41 kg/m   Pertinent Findings  CN II-XII intact Right external auditory canal with cerumen impaction, minimal cerumen the left external auditory canal.  Bilateral TMs intact  with well pneumatized middle ear space after disimpaction Weber 512: equal Rinne 512: AC > BC b/l  Anterior rhinoscopy: Septum midline; bilateral inferior turbinates with hypertrophy No lesions of oral cavity/oropharynx; dentition in normal limits No obviously palpable neck masses/lymphadenopathy/thyromegaly No respiratory distress or stridor  Seprately Identifiable Procedures:  Procedure: bilateral ear microscopy and cerumen removal using microscope (CPT 69629) - Mod 25 Pre-procedure diagnosis: unilateral cerumen impaction right external auditory canal Post-procedure diagnosis: same Indication: bilateral cerumen impaction; given patient's otologic complaints and history as well as for improved and comprehensive examination of external ear and tympanic membrane, bilateral otologic examination using microscope was performed and impacted cerumen removed  Procedure: Patient was placed semi-recumbent. Both ear canals were examined using the microscope with findings above. Cerumen removed from the right external auditory canal using suction and currette with improvement in EAC examination and patency. Left: EAC was patent. TM was intact . Middle ear was aerated. Drainage: none Right: EAC was patent. TM was intact . Middle ear was aerated . Drainage: none Patient tolerated the procedure well.  Impression & Plans:  Keonta Alsip is a 73 y.o. male with the following   Cerumen impaction-  The patient presented today with cerumen impaction.  This was removed without difficulty.  I see no signs of infection.  The patient will reach out to the office if she develops any new or worsening signs or symptoms.  She does not feel she has any significant change to her baseline hearing and did not elect for audiology evaluation today as he will follow up with his audiologist.   - f/u prn   Thank you for allowing me the opportunity to care for your patient. Please do not hesitate to contact me should you  have any other questions.  Sincerely, Belma Boxer PA-C [facility] Phone: [phone] Fax: [phone]  12/15/2023, 8:57 AM

## 2024-01-07 DIAGNOSIS — H903 Sensorineural hearing loss, bilateral: Secondary | ICD-10-CM | POA: Diagnosis not present

## 2024-02-15 ENCOUNTER — Other Ambulatory Visit: Payer: Self-pay | Admitting: Family Medicine

## 2024-02-17 ENCOUNTER — Encounter: Payer: Self-pay | Admitting: Family Medicine

## 2024-02-17 ENCOUNTER — Ambulatory Visit (INDEPENDENT_AMBULATORY_CARE_PROVIDER_SITE_OTHER): Admitting: Family Medicine

## 2024-02-17 VITALS — BP 116/60 | HR 72 | Temp 97.6°F | Wt 179.5 lb

## 2024-02-17 DIAGNOSIS — M25552 Pain in left hip: Secondary | ICD-10-CM | POA: Diagnosis not present

## 2024-02-17 DIAGNOSIS — M545 Low back pain, unspecified: Secondary | ICD-10-CM | POA: Diagnosis not present

## 2024-02-17 NOTE — Progress Notes (Signed)
 Established Patient Office Visit  Subjective   Patient ID: Jermaine Brady, male    DOB: [birth year]  Age: 73 y.o. MRN: [id number]  Chief Complaint  Patient presents with   Hip Pain    HPI   Kinney is seen with left lateral hip pain.  He actually has some pain left lower lumbar area radiates somewhat posterior and lateral down toward the hamstring region.  He had similar pain few years ago.  X-ray at that time showed relatively mild osteoarthritis.  No localized tenderness to palpation.  Denies any injury.  He has had some neuropathic type symptoms with numbness lower extremities and had B12 deficiency detected a year ago which did improve some with B12 replacement.  Still has occasional numbness left foot but not progressive.  No weakness.  No urine or stool incontinence.  With similar flareup a few years ago he ended up getting physical therapy which seem to help substantially.  Past Medical History:  Diagnosis Date   ALLERGIC RHINITIS 10/23/2008   Allergy    seasonal   Arthritis    neck (05/19/2016)   Cataract    bilateral,removed   DIVERTICULOSIS, COLON 10/23/2008   Heart murmur    Hepatitis C    don't know when I contracted this; went thru treatment in ~ 2007-2008 =cured per pt   History of blood transfusion 2008?   during tx to cure my Hep C   History of kidney stones    HYPERTENSION 10/23/2008   Past Surgical History:  Procedure Laterality Date   ABDOMINAL HERNIA REPAIR  2005   ASD REPAIR  1971   ASD REPAIR  age 32   CARDIAC CATHETERIZATION  1960s - 31s X 2   at [facility]   cataracts Bilateral    COLON SURGERY  2007   ,resection, diverticular rupture   COLONOSCOPY  06/12/2015   HERNIA REPAIR  2005   POLYPECTOMY     SHOULDER ARTHROSCOPY W/ ROTATOR CUFF REPAIR Right 2014    reports that he has never smoked. He has been exposed to tobacco smoke. He has never used smokeless tobacco. He reports current alcohol use of about 2.0 standard drinks of alcohol  per week. He reports that he does not use drugs. family history includes Alcohol abuse in his father and another family member; Heart disease in an other family member. No Known Allergies  Review of Systems  Constitutional:  Negative for chills, fever and weight loss.  Genitourinary:  Negative for dysuria.  Musculoskeletal:  Positive for back pain.  Neurological:  Negative for focal weakness.      Objective:     BP 116/60 (BP Location: Left Arm, Patient Position: Sitting, Cuff Size: Normal)   Pulse 72   Temp 97.6 F (36.4 C) (Oral)   Wt 179 lb 8 oz (81.4 kg)   SpO2 95%   BMI 23.68 kg/m  BP Readings from Last 3 Encounters:  02/17/24 116/60  12/15/23 (!) 144/72  11/23/23 118/60   Wt Readings from Last 3 Encounters:  02/17/24 179 lb 8 oz (81.4 kg)  12/15/23 185 lb (83.9 kg)  11/23/23 180 lb 3.2 oz (81.7 kg)      Physical Exam Vitals reviewed.  Constitutional:      General: He is not in acute distress.    Appearance: He is not ill-appearing.  Cardiovascular:     Rate and Rhythm: Normal rate and regular rhythm.  Musculoskeletal:     Comments: Right leg raises are negative bilaterally.  Good range of motion left hip.  No lateral tenderness.  Only minimal pain with internal rotation.  Neurological:     Mental Status: He is alert.     Comments: Full strength lower extremity on the left side with plantarflexion, dorsiflexion, knee extension, and hip flexion.  2+ reflexes ankle and knee bilaterally      No results found for any visits on 02/17/24.    The ASCVD Risk score (Arnett DK, et al., 2019) failed to calculate for the following reasons:   Cannot find a previous HDL lab   Cannot find a previous total cholesterol lab    Assessment & Plan:   Problem List Items Addressed This Visit   None Visit Diagnoses       Acute left-sided low back pain, unspecified whether sciatica present    -  Primary   Relevant Orders   Ambulatory referral to Physical Therapy      Patient presents with several week history of pain which is mostly left lateral hip and lumbar region.  No evidence for greater trochanteric bursitis.  No reproducible tenderness.  Doubt intra-articular hip pathology. - Set up PT which seem to benefit him previously with similar flare - Caution with prolonged over-the-counter nonsteroidal use with his age - Be in touch if symptoms not improving with physical therapy after a few weeks  No follow-ups on file.    Wolm Scarlet, MD

## 2024-02-17 NOTE — Patient Instructions (Signed)
 Be in touch if pain not improving over next few weeks with PT.

## 2024-03-02 ENCOUNTER — Encounter: Payer: Self-pay | Admitting: Physical Therapy

## 2024-03-02 ENCOUNTER — Other Ambulatory Visit: Payer: Self-pay

## 2024-03-02 ENCOUNTER — Ambulatory Visit: Attending: Family Medicine | Admitting: Physical Therapy

## 2024-03-02 DIAGNOSIS — M5459 Other low back pain: Secondary | ICD-10-CM | POA: Insufficient documentation

## 2024-03-02 DIAGNOSIS — R262 Difficulty in walking, not elsewhere classified: Secondary | ICD-10-CM | POA: Diagnosis not present

## 2024-03-02 DIAGNOSIS — M6281 Muscle weakness (generalized): Secondary | ICD-10-CM | POA: Diagnosis not present

## 2024-03-02 DIAGNOSIS — M545 Low back pain, unspecified: Secondary | ICD-10-CM | POA: Diagnosis not present

## 2024-03-02 NOTE — Therapy (Signed)
 OUTPATIENT PHYSICAL THERAPY THORACOLUMBAR EVALUATION   Patient Name: Jermaine Brady MRN: [id number] DOB:Mar 24, 1951, 73 y.o., male Today's Date: 03/02/2024  END OF SESSION:  PT End of Session - 03/02/24 0801     Visit Number 1    Date for PT Re-Evaluation 06/02/24    Authorization Type HTA    PT Start Time 0800    PT Stop Time 0845    PT Time Calculation (min) 45 min    Activity Tolerance Patient tolerated treatment well    Behavior During Therapy [facility] for tasks assessed/performed          Past Medical History:  Diagnosis Date   ALLERGIC RHINITIS 10/23/2008   Allergy    seasonal   Arthritis    neck (05/19/2016)   Cataract    bilateral,removed   DIVERTICULOSIS, COLON 10/23/2008   Heart murmur    Hepatitis C    don't know when I contracted this; went thru treatment in ~ 2007-2008 =cured per pt   History of blood transfusion 2008?   during tx to cure my Hep C   History of kidney stones    HYPERTENSION 10/23/2008   Past Surgical History:  Procedure Laterality Date   ABDOMINAL HERNIA REPAIR  2005   ASD REPAIR  1971   ASD REPAIR  age 6   CARDIAC CATHETERIZATION  1960s - 2s X 2   at [facility]   cataracts Bilateral    COLON SURGERY  2007   ,resection, diverticular rupture   COLONOSCOPY  06/12/2015   HERNIA REPAIR  2005   POLYPECTOMY     SHOULDER ARTHROSCOPY W/ ROTATOR CUFF REPAIR Right 2014   Patient Active Problem List   Diagnosis Date Noted   BPH (benign prostatic hyperplasia) 11/10/2018   Mixed conductive and sensorineural hearing loss of left ear with restricted hearing of right ear 01/03/2018   Sensorineural hearing loss (SNHL) of both ears 12/29/2017   Viral upper respiratory tract infection 12/29/2017   Enlarged prostate 12/19/2017   Seasonal allergies 12/19/2017   Chest pain 05/19/2016   Hand injury 04/09/2013   Hepatitis C 11/06/2010   CELLULITIS, HAND, LEFT 01/07/2010   Essential hypertension 10/23/2008   Allergic rhinitis 10/23/2008    Diverticulosis of colon 10/23/2008   History of colonic polyps 10/23/2008    PCP: Micheal, MD  REFERRING PROVIDER: Micheal, MD  REFERRING DIAG: Low back pain, hip pain  Rationale for Evaluation and Treatment: Rehabilitation  THERAPY DIAG:  Other low back pain  Muscle weakness (generalized)  Difficulty in walking, not elsewhere classified  ONSET DATE: 02/17/24  SUBJECTIVE:  SUBJECTIVE STATEMENT: Patient unsure of a specific cause of pain, reports about a month ago started having LBP and left hip, buttock and posterior thigh pain, reports that at night the pain is worse.  PERTINENT HISTORY:  Hernia repair, RCR, HTN  PAIN:  Are you having pain? Yes: NPRS scale: 2/10 Pain location: low back, left buttock and hip Pain description: ache, burn, constant Aggravating factors: at night, hard to sleep, lying on the right side pain up to 5-6/10 Relieving factors: acetaminephin, get up and move pain at best a 1-2/10  PRECAUTIONS: None  RED FLAGS: None   WEIGHT BEARING RESTRICTIONS: No  FALLS:  Has patient fallen in last 6 months? 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  DIAGNOSTIC FINDINGS:  none  COGNITION: Overall cognitive status: Within functional limits for tasks assessed     SENSATION: WFL  MUSCLE LENGTH: SLR pain at 45  degrees Very tight piriformis with my pain Very tight calves  POSTURE: rounded shoulders, forward head, and decreased lumbar lordosis  PALPATION: Tightness in the lumbar mms, some tenderness int he left buttock  LUMBAR ROM:   AROM eval  Flexion 50%  Extension 50%  Right lateral flexion 50%  Left lateral flexion 50%  Right rotation   Left rotation    (Blank rows = not tested)  LOWER EXTREMITY ROM:     Active  Right eval Left eval  Hip flexion    Hip extension    Hip abduction    Hip adduction    Hip internal rotation    Hip external rotation    Knee flexion    Knee extension    Ankle dorsiflexion    Ankle plantarflexion    Ankle inversion    Ankle eversion     (Blank rows = not tested)  LOWER EXTREMITY MMT:    MMT Right eval Left eval  Hip flexion 4- 4-  Hip extension    Hip abduction 4- 4-  Hip adduction    Hip internal rotation    Hip external rotation    Knee flexion    Knee extension    Ankle dorsiflexion    Ankle plantarflexion    Ankle inversion    Ankle eversion     (Blank rows = not tested)  LUMBAR SPECIAL TESTS:  Straight leg raise test: Positive and Slump test: Positive  FUNCTIONAL TESTS:  Timed up and go (TUG): 13 s  GAIT: Distance walked: 60 feet Assistive device utilized: None Level of assistance: Complete Independence Comments:   TREATMENT DATE:  03/02/24 Evaluation HEP  PATIENT EDUCATION:  Education details: POC/HEP Person educated: Patient Education method: Programmer, multimedia, Facilities manager, Verbal cues, and Handouts Education comprehension: verbalized understanding  HOME EXERCISE PROGRAM: Access Code: [email or web address] URL: [email or web address] Date: 03/02/2024 Prepared by: Ozell Mainland  Exercises - Supine Lower Trunk Rotation  - 2 x daily - 7 x weekly - 1 sets - 10 reps - 10 hold -  Hooklying Single Knee to Chest Stretch  - 2 x daily - 7 x weekly - 1 sets - 5 reps - 20 hold - Supine Piriformis Stretch Pulling Heel to Hip  - 2 x daily - 7 x weekly - 1 sets - 5 reps - 20 hold - Seated Hamstring Stretch with Chair  - 2 x daily - 7 x weekly - 1 sets - 5 reps - 20 hold - Standing Bilateral Gastroc Stretch with Step  - 2 x daily - 7 x weekly - 1 sets - 5 reps - 20 hold  ASSESSMENT:  CLINICAL IMPRESSION: Patient is a 73 y.o. male who was seen today for physical therapy evaluation and treatment for LBP and left hip pain.  He is very tight in the piriformis, HS and calves.  Has a little weakness in the bilateral LE's,  I really feel that the issue is how tight he is.   OBJECTIVE IMPAIRMENTS: Abnormal gait, cardiopulmonary status limiting activity, decreased activity tolerance, decreased balance, decreased coordination, decreased endurance, decreased mobility, difficulty walking, decreased ROM, decreased strength, increased muscle spasms, impaired flexibility, improper body mechanics, postural dysfunction, and pain.   REHAB POTENTIAL: Good  CLINICAL DECISION MAKING: Stable/uncomplicated  EVALUATION COMPLEXITY: Low   GOALS: Goals reviewed with patient? Yes  SHORT TERM GOALS: Target date: 03/24/24  Independent with initial HEP Baseline: Goal status: INITIAL  LONG TERM GOALS: Target date: 06/02/24  Independent with advanced HEP Baseline:  Goal status: INITIAL  2.  Understand proper posture and body mechanics Baseline:  Goal status: INITIAL  3.  Increase SLR to 60 degrees Baseline: 45 degrees Goal status: INITIAL  4.  Decrease pain 50% Baseline:  Goal status: INITIAL  5.  Sleep throughout the night Baseline: wakes up about 4 x due to discomfort Goal status: INITIAL   PLAN:  PT FREQUENCY: 1x/week  PT DURATION: 12 weeks  PLANNED INTERVENTIONS: 97164- PT Re-evaluation, 97110-Therapeutic exercises, 97530- Therapeutic activity, 97112- Neuromuscular  re-education, 97535- Self Care, 02859- Manual therapy, [id number]- Electrical stimulation (unattended), [id number]- Ultrasound, 02987- Traction (mechanical), [id number]- Ionotophoresis 4mg /ml Dexamethasone, Patient/Family education, Balance training, Taping, Joint mobilization, Spinal mobilization, Cryotherapy, and Moist heat.  PLAN FOR NEXT SESSION: gym, focus on flexibility   Mazella Deen W, PT 03/02/2024, 8:02 AM

## 2024-03-07 ENCOUNTER — Ambulatory Visit: Admitting: Physical Therapy

## 2024-03-07 ENCOUNTER — Encounter: Payer: Self-pay | Admitting: Physical Therapy

## 2024-03-07 DIAGNOSIS — M5459 Other low back pain: Secondary | ICD-10-CM

## 2024-03-07 DIAGNOSIS — R262 Difficulty in walking, not elsewhere classified: Secondary | ICD-10-CM

## 2024-03-07 DIAGNOSIS — M6281 Muscle weakness (generalized): Secondary | ICD-10-CM

## 2024-03-07 NOTE — Therapy (Signed)
 OUTPATIENT PHYSICAL THERAPY THORACOLUMBAR EVALUATION   Patient Name: Jermaine Brady MRN: [id number] DOB:1950-12-12, 73 y.o., male Today's Date: 03/07/2024  END OF SESSION:  PT End of Session - 03/07/24 0924     Visit Number 2    Date for PT Re-Evaluation 06/02/24    Authorization Type HTA    PT Start Time 0924    PT Stop Time 1014    PT Time Calculation (min) 50 min    Activity Tolerance Patient tolerated treatment well    Behavior During Therapy [facility] for tasks assessed/performed          Past Medical History:  Diagnosis Date   ALLERGIC RHINITIS 10/23/2008   Allergy    seasonal   Arthritis    neck (05/19/2016)   Cataract    bilateral,removed   DIVERTICULOSIS, COLON 10/23/2008   Heart murmur    Hepatitis C    don't know when I contracted this; went thru treatment in ~ 2007-2008 =cured per pt   History of blood transfusion 2008?   during tx to cure my Hep C   History of kidney stones    HYPERTENSION 10/23/2008   Past Surgical History:  Procedure Laterality Date   ABDOMINAL HERNIA REPAIR  2005   ASD REPAIR  1971   ASD REPAIR  age 38   CARDIAC CATHETERIZATION  1960s - 49s X 2   at [facility]   cataracts Bilateral    COLON SURGERY  2007   ,resection, diverticular rupture   COLONOSCOPY  06/12/2015   HERNIA REPAIR  2005   POLYPECTOMY     SHOULDER ARTHROSCOPY W/ ROTATOR CUFF REPAIR Right 2014   Patient Active Problem List   Diagnosis Date Noted   BPH (benign prostatic hyperplasia) 11/10/2018   Mixed conductive and sensorineural hearing loss of left ear with restricted hearing of right ear 01/03/2018   Sensorineural hearing loss (SNHL) of both ears 12/29/2017   Viral upper respiratory tract infection 12/29/2017   Enlarged prostate 12/19/2017   Seasonal allergies 12/19/2017   Chest pain 05/19/2016   Hand injury 04/09/2013   Hepatitis C 11/06/2010   CELLULITIS, HAND, LEFT 01/07/2010   Essential hypertension 10/23/2008   Allergic rhinitis 10/23/2008    Diverticulosis of colon 10/23/2008   History of colonic polyps 10/23/2008    PCP: Micheal, MD  REFERRING PROVIDER: Micheal, MD  REFERRING DIAG: Low back pain, hip pain  Rationale for Evaluation and Treatment: Rehabilitation  THERAPY DIAG:  Other low back pain  Muscle weakness (generalized)  Difficulty in walking, not elsewhere classified  ONSET DATE: 02/17/24  SUBJECTIVE:  SUBJECTIVE STATEMENT: Reports that he tried the stretches but really feels tight  PERTINENT HISTORY:  Hernia repair, RCR, HTN  PAIN:  Are you having pain? Yes: NPRS scale: 2/10 Pain location: low back, left buttock and hip Pain description: ache, burn, constant Aggravating factors: at night, hard to sleep, lying on the right side pain up to 5-6/10 Relieving factors: acetaminephin, get up and move pain at best a 1-2/10  PRECAUTIONS: None  RED FLAGS: None   WEIGHT BEARING RESTRICTIONS: No  FALLS:  Has patient fallen in last 6 months? 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  DIAGNOSTIC FINDINGS:  none  COGNITION: Overall cognitive status: Within functional limits for tasks assessed     SENSATION: WFL  MUSCLE LENGTH: SLR pain at 45 degrees Very tight piriformis with my pain Very tight calves  POSTURE: rounded shoulders, forward head, and decreased  lumbar lordosis  PALPATION: Tightness in the lumbar mms, some tenderness int he left buttock  LUMBAR ROM:   AROM eval  Flexion 50%  Extension 50%  Right lateral flexion 50%  Left lateral flexion 50%  Right rotation   Left rotation    (Blank rows = not tested)  LOWER EXTREMITY ROM:     Active  Right eval Left eval  Hip flexion    Hip extension    Hip abduction    Hip adduction    Hip internal rotation    Hip external rotation    Knee flexion    Knee extension    Ankle dorsiflexion    Ankle plantarflexion    Ankle inversion    Ankle eversion     (Blank rows = not tested)  LOWER EXTREMITY MMT:    MMT Right eval Left eval  Hip flexion 4- 4-  Hip extension    Hip abduction 4- 4-  Hip adduction    Hip internal rotation    Hip external rotation    Knee flexion    Knee extension    Ankle dorsiflexion    Ankle plantarflexion    Ankle inversion    Ankle eversion     (Blank rows = not tested)  LUMBAR SPECIAL TESTS:  Straight leg raise test: Positive and Slump test: Positive  FUNCTIONAL TESTS:  Timed up and go (TUG): 13 s  GAIT: Distance walked: 60 feet Assistive device utilized: None Level of assistance: Complete Independence Comments:   TREATMENT DATE:  03/07/24 Nustep level 5 x 6 minutes Calf stretches Feet on ball K2C, rotation, bridges, isometric abs Passive stretch LE's Reviewed HEP and talked more about the need for flexibility, discussed posture for sleeping to decrease the stress on the back with pillow b/n knees STM with T-gun to the lumbar, buttocks and HS  03/02/24 Evaluation HEP  PATIENT EDUCATION:  Education details: POC/HEP Person educated: Patient Education method: Programmer, multimedia, Facilities manager, Verbal cues, and Handouts Education comprehension: verbalized understanding  HOME EXERCISE PROGRAM: Access Code:  [email or web address] URL: [email or web address] Date: 03/02/2024 Prepared by: Ozell Mainland  Exercises - Supine Lower Trunk Rotation  - 2 x daily - 7 x weekly - 1 sets - 10 reps - 10 hold - Hooklying Single Knee to Chest Stretch  - 2 x daily - 7 x weekly - 1 sets - 5 reps - 20 hold - Supine Piriformis Stretch Pulling Heel to Hip  - 2 x daily - 7 x weekly - 1 sets - 5 reps - 20 hold - Seated Hamstring Stretch with Chair  - 2 x daily - 7 x weekly - 1 sets - 5 reps - 20 hold - Standing Bilateral Gastroc Stretch with Step  - 2 x daily - 7 x weekly - 1 sets - 5 reps - 20 hold  ASSESSMENT:  CLINICAL IMPRESSION: Started some exercises, reviewed HEP and spoke about posture for sleeping as he reported some pain while sleeping.  I did focus on the tight LE's, very tight and sore with HS, calves and piriformis mms.  Tolerated well but did have to ease up some.  Patient is a 73 y.o. male who was seen today for physical therapy evaluation and treatment for LBP and left hip pain.  He is very tight in the piriformis, HS and calves.  Has a little weakness in the bilateral LE's,  I really feel that the issue is how tight he is.   OBJECTIVE IMPAIRMENTS: Abnormal gait, cardiopulmonary status limiting activity, decreased activity tolerance, decreased balance, decreased coordination, decreased endurance, decreased mobility, difficulty walking, decreased ROM, decreased strength, increased muscle spasms, impaired flexibility, improper body mechanics, postural dysfunction, and pain.   REHAB POTENTIAL: Good  CLINICAL DECISION MAKING: Stable/uncomplicated  EVALUATION COMPLEXITY: Low   GOALS: Goals reviewed with patient? Yes  SHORT TERM GOALS: Target date: 03/24/24  Independent with initial HEP Baseline: Goal status: progressing 03/07/24  LONG TERM GOALS: Target date: 06/02/24  Independent with advanced HEP Baseline:  Goal status: INITIAL  2.  Understand proper posture and body mechanics Baseline:   Goal status: INITIAL  3.  Increase SLR to 60 degrees Baseline: 45 degrees Goal status: INITIAL  4.  Decrease pain 50% Baseline:  Goal status: INITIAL  5.  Sleep throughout the night Baseline: wakes up about 4 x due to discomfort Goal status: INITIAL   PLAN:  PT FREQUENCY: 1x/week  PT DURATION: 12 weeks  PLANNED INTERVENTIONS: 97164- PT Re-evaluation, 97110-Therapeutic exercises, 97530- Therapeutic activity, 97112- Neuromuscular re-education, 97535- Self Care, 02859- Manual therapy, G0283- Electrical stimulation (unattended), [id number]- Ultrasound, 02987- Traction (mechanical), [id number]- Ionotophoresis 4mg /ml Dexamethasone, Patient/Family education, Balance training, Taping, Joint mobilization, Spinal mobilization, Cryotherapy, and Moist heat.  PLAN FOR NEXT SESSION: gym, focus on flexibility   MAINLAND OZELL ORN, PT 03/07/2024, 9:26 AM

## 2024-03-14 DIAGNOSIS — N403 Nodular prostate with lower urinary tract symptoms: Secondary | ICD-10-CM | POA: Diagnosis not present

## 2024-03-15 ENCOUNTER — Ambulatory Visit

## 2024-03-15 DIAGNOSIS — R262 Difficulty in walking, not elsewhere classified: Secondary | ICD-10-CM

## 2024-03-15 DIAGNOSIS — M5459 Other low back pain: Secondary | ICD-10-CM | POA: Diagnosis not present

## 2024-03-15 DIAGNOSIS — M6281 Muscle weakness (generalized): Secondary | ICD-10-CM

## 2024-03-15 NOTE — Therapy (Signed)
 OUTPATIENT PHYSICAL THERAPY THORACOLUMBAR TREATMENT   Patient Name: Jermaine Brady MRN: [id number] DOB:26-Nov-1950, 73 y.o., male Today's Date: 03/15/2024  END OF SESSION:  PT End of Session - 03/15/24 1543     Visit Number 3    Date for PT Re-Evaluation 06/02/24    Authorization Type HTA    PT Start Time 1543    PT Stop Time 1630    PT Time Calculation (min) 47 min    Activity Tolerance Patient tolerated treatment well    Behavior During Therapy [facility] for tasks assessed/performed           Past Medical History:  Diagnosis Date   ALLERGIC RHINITIS 10/23/2008   Allergy    seasonal   Arthritis    neck (05/19/2016)   Cataract    bilateral,removed   DIVERTICULOSIS, COLON 10/23/2008   Heart murmur    Hepatitis C    don't know when I contracted this; went thru treatment in ~ 2007-2008 =cured per pt   History of blood transfusion 2008?   during tx to cure my Hep C   History of kidney stones    HYPERTENSION 10/23/2008   Past Surgical History:  Procedure Laterality Date   ABDOMINAL HERNIA REPAIR  2005   ASD REPAIR  1971   ASD REPAIR  age 38   CARDIAC CATHETERIZATION  1960s - 23s X 2   at [facility]   cataracts Bilateral    COLON SURGERY  2007   ,resection, diverticular rupture   COLONOSCOPY  06/12/2015   HERNIA REPAIR  2005   POLYPECTOMY     SHOULDER ARTHROSCOPY W/ ROTATOR CUFF REPAIR Right 2014   Patient Active Problem List   Diagnosis Date Noted   BPH (benign prostatic hyperplasia) 11/10/2018   Mixed conductive and sensorineural hearing loss of left ear with restricted hearing of right ear 01/03/2018   Sensorineural hearing loss (SNHL) of both ears 12/29/2017   Viral upper respiratory tract infection 12/29/2017   Enlarged prostate 12/19/2017   Seasonal allergies 12/19/2017   Chest pain 05/19/2016   Hand injury 04/09/2013   Hepatitis C 11/06/2010   CELLULITIS, HAND, LEFT 01/07/2010   Essential hypertension 10/23/2008   Allergic rhinitis 10/23/2008    Diverticulosis of colon 10/23/2008   History of colonic polyps 10/23/2008    PCP: Micheal, MD  REFERRING PROVIDER: Micheal, MD  REFERRING DIAG: Low back pain, hip pain  Rationale for Evaluation and Treatment: Rehabilitation  THERAPY DIAG:  Other low back pain  Muscle weakness (generalized)  Difficulty in walking, not elsewhere classified  ONSET DATE: 02/17/24  SUBJECTIVE:  SUBJECTIVE STATEMENT: Feeling better. I think the exercises where I pull my leg over the other helps. It has been good the last couple of night, which is a big improvement.   PERTINENT HISTORY:  Hernia repair, RCR, HTN  PAIN:  Are you having pain? Yes: NPRS scale: 2/10 Pain location: low back, left buttock and hip Pain description: ache, burn, constant Aggravating factors: at night, hard to sleep, lying on the right side pain up to 5-6/10 Relieving factors: acetaminephin, get up and move pain at best a 1-2/10  PRECAUTIONS: None  RED FLAGS: None   WEIGHT BEARING RESTRICTIONS: No  FALLS:  Has patient fallen in last 6 months? 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  DIAGNOSTIC FINDINGS:  none  COGNITION: Overall cognitive status: Within functional limits for tasks assessed     SENSATION: WFL  MUSCLE LENGTH: SLR pain at 45 degrees Very tight piriformis  with my pain Very tight calves  POSTURE: rounded shoulders, forward head, and decreased lumbar lordosis  PALPATION: Tightness in the lumbar mms, some tenderness int he left buttock  LUMBAR ROM:   AROM eval  Flexion 50%  Extension 50%  Right lateral flexion 50%  Left lateral flexion 50%  Right rotation   Left rotation    (Blank rows = not tested)  LOWER EXTREMITY ROM:     Active  Right eval Left eval  Hip flexion    Hip extension    Hip abduction    Hip adduction    Hip internal rotation    Hip external rotation    Knee flexion    Knee extension    Ankle dorsiflexion    Ankle plantarflexion    Ankle inversion    Ankle eversion     (Blank rows = not tested)  LOWER EXTREMITY MMT:    MMT Right eval Left eval  Hip flexion 4- 4-  Hip extension    Hip abduction 4- 4-  Hip adduction    Hip internal rotation    Hip external rotation    Knee flexion    Knee extension    Ankle dorsiflexion    Ankle plantarflexion    Ankle inversion    Ankle eversion     (Blank rows = not tested)  LUMBAR SPECIAL TESTS:  Straight leg raise test: Positive and Slump test: Positive  FUNCTIONAL TESTS:  Timed up and go (TUG): 13 s  GAIT: Distance walked: 60 feet Assistive device utilized: None Level of assistance: Complete Independence Comments:   TREATMENT DATE: 03/15/24 NuStep L5 x47mins  Calf stretch 30s x2  Straight arm pulls 10# 2x10 Supine stretches- LTR, HS, glutes, figure 4, ITB, SKTC  Feet on ball rotations, knees to chest Bridges 2x10 Seated piriformis and HS stretch  STS 2x10 Leg press 20# 2x10   03/07/24 Nustep level 5 x 6 minutes Calf stretches Feet on ball K2C, rotation, bridges, isometric abs Passive stretch LE's Reviewed HEP and talked more about the need for flexibility, discussed posture for sleeping to decrease the stress on the back with pillow b/n knees STM with T-gun  to the lumbar, buttocks and HS  03/02/24 Evaluation HEP                                                                                                                                  PATIENT EDUCATION:  Education details: POC/HEP Person educated: Patient Education method: Programmer, multimedia, Facilities manager, Verbal cues, and Handouts Education comprehension: verbalized understanding  HOME EXERCISE PROGRAM: Access Code: [email or web address] URL: [email or web address] Date: 03/02/2024 Prepared by: Ozell Mainland  Exercises - Supine Lower Trunk Rotation  - 2 x daily - 7 x weekly - 1 sets - 10 reps - 10 hold - Hooklying Single Knee to Chest Stretch  - 2 x daily - 7 x weekly - 1 sets - 5 reps - 20 hold - Supine Piriformis Stretch Pulling Heel to Hip  - 2 x daily - 7 x weekly - 1 sets - 5 reps - 20 hold - Seated Hamstring Stretch with Chair  - 2 x daily - 7 x weekly - 1 sets - 5 reps - 20 hold - Standing Bilateral Gastroc Stretch with Step  - 2 x daily - 7 x weekly - 1 sets - 5 reps - 20 hold  ASSESSMENT:  CLINICAL IMPRESSION: Continued with exercises, and stretching to focus on the tight LE's. He is pretty tight and sore in HS, calves and piriformis. Tolerated well and reports he is feeling better overall.   Patient is a 73 y.o. male who was seen today for physical therapy evaluation and treatment for LBP and left hip pain.  He is very tight in the piriformis, HS and calves.  Has a little weakness in the bilateral LE's,  I really feel that the issue is how tight he is.   OBJECTIVE IMPAIRMENTS: Abnormal gait, cardiopulmonary status limiting activity, decreased activity tolerance, decreased balance, decreased coordination, decreased endurance, decreased mobility, difficulty walking, decreased ROM, decreased strength, increased muscle spasms, impaired flexibility, improper body mechanics, postural dysfunction, and pain.   REHAB POTENTIAL: Good  CLINICAL DECISION MAKING: Stable/uncomplicated  EVALUATION COMPLEXITY: Low   GOALS: Goals reviewed with patient?  Yes  SHORT TERM GOALS: Target date: 03/24/24  Independent with initial HEP Baseline: Goal status: progressing 03/07/24  LONG TERM GOALS: Target date: 06/02/24  Independent with advanced HEP Baseline:  Goal status: INITIAL  2.  Understand proper posture and body mechanics Baseline:  Goal status: INITIAL  3.  Increase SLR to 60 degrees Baseline: 45 degrees Goal status: INITIAL  4.  Decrease pain 50% Baseline:  Goal status: INITIAL  5.  Sleep throughout the night Baseline: wakes up about 4 x due to discomfort Goal status: INITIAL   PLAN:  PT FREQUENCY: 1x/week  PT DURATION: 12 weeks  PLANNED INTERVENTIONS: 97164- PT Re-evaluation, 97110-Therapeutic exercises, 97530- Therapeutic activity, 97112- Neuromuscular re-education, 97535- Self Care, 02859- Manual therapy, G0283- Electrical stimulation (unattended), 97035- Ultrasound, 02987- Traction (mechanical), [id number]- Ionotophoresis 4mg /ml Dexamethasone, Patient/Family education, Balance training, Taping, Joint mobilization, Spinal mobilization,  Cryotherapy, and Moist heat.  PLAN FOR NEXT SESSION: gym, focus on flexibility   Almetta Fam, PT 03/15/2024, 4:26 PM

## 2024-03-21 DIAGNOSIS — N5201 Erectile dysfunction due to arterial insufficiency: Secondary | ICD-10-CM | POA: Diagnosis not present

## 2024-03-21 DIAGNOSIS — R351 Nocturia: Secondary | ICD-10-CM | POA: Diagnosis not present

## 2024-03-21 DIAGNOSIS — N403 Nodular prostate with lower urinary tract symptoms: Secondary | ICD-10-CM | POA: Diagnosis not present

## 2024-03-22 NOTE — Therapy (Signed)
 OUTPATIENT PHYSICAL THERAPY THORACOLUMBAR TREATMENT   Patient Name: Jermaine Brady MRN: [id number] DOB:1950/09/21, 73 y.o., male Today's Date: 03/23/2024  END OF SESSION:  PT End of Session - 03/23/24 0839     Visit Number 4    Date for PT Re-Evaluation 06/02/24    Authorization Type HTA    PT Start Time 0840    PT Stop Time 0925    PT Time Calculation (min) 45 min    Activity Tolerance Patient tolerated treatment well    Behavior During Therapy [facility] for tasks assessed/performed            Past Medical History:  Diagnosis Date   ALLERGIC RHINITIS 10/23/2008   Allergy    seasonal   Arthritis    neck (05/19/2016)   Cataract    bilateral,removed   DIVERTICULOSIS, COLON 10/23/2008   Heart murmur    Hepatitis C    don't know when I contracted this; went thru treatment in ~ 2007-2008 =cured per pt   History of blood transfusion 2008?   during tx to cure my Hep C   History of kidney stones    HYPERTENSION 10/23/2008   Past Surgical History:  Procedure Laterality Date   ABDOMINAL HERNIA REPAIR  2005   ASD REPAIR  1971   ASD REPAIR  age 73   CARDIAC CATHETERIZATION  1960s - 17s X 2   at [facility]   cataracts Bilateral    COLON SURGERY  2007   ,resection, diverticular rupture   COLONOSCOPY  06/12/2015   HERNIA REPAIR  2005   POLYPECTOMY     SHOULDER ARTHROSCOPY W/ ROTATOR CUFF REPAIR Right 2014   Patient Active Problem List   Diagnosis Date Noted   BPH (benign prostatic hyperplasia) 11/10/2018   Mixed conductive and sensorineural hearing loss of left ear with restricted hearing of right ear 01/03/2018   Sensorineural hearing loss (SNHL) of both ears 12/29/2017   Viral upper respiratory tract infection 12/29/2017   Enlarged prostate 12/19/2017   Seasonal allergies 12/19/2017   Chest pain 05/19/2016   Hand injury 04/09/2013   Hepatitis C 11/06/2010   CELLULITIS, HAND, LEFT 01/07/2010   Essential hypertension 10/23/2008   Allergic rhinitis 10/23/2008    Diverticulosis of colon 10/23/2008   History of colonic polyps 10/23/2008    PCP: Micheal, MD  REFERRING PROVIDER: Micheal, MD  REFERRING DIAG: Low back pain, hip pain  Rationale for Evaluation and Treatment: Rehabilitation  THERAPY DIAG:  Other low back pain  Muscle weakness (generalized)  Difficulty in walking, not elsewhere classified  ONSET DATE: 02/17/24  SUBJECTIVE:  SUBJECTIVE STATEMENT: [name] alright. The back is feeling pretty good.   PERTINENT HISTORY:  Hernia repair, RCR, HTN  PAIN:  Are you having pain? Yes: NPRS scale: 2/10 Pain location: low back, left buttock and hip Pain description: ache, burn, constant Aggravating factors: at night, hard to sleep, lying on the right side pain up to 5-6/10 Relieving factors: acetaminephin, get up and move pain at best a 1-2/10  PRECAUTIONS: None  RED FLAGS: None   WEIGHT BEARING RESTRICTIONS: No  FALLS:  Has patient fallen in last 6 months? 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  DIAGNOSTIC FINDINGS:  none  COGNITION: Overall cognitive status: Within functional limits for tasks assessed     SENSATION: WFL  MUSCLE LENGTH: SLR pain at 45 degrees Very tight piriformis with my pain Very tight calves  POSTURE: rounded shoulders, forward head, and decreased lumbar  lordosis  PALPATION: Tightness in the lumbar mms, some tenderness int he left buttock  LUMBAR ROM:   AROM eval  Flexion 50%  Extension 50%  Right lateral flexion 50%  Left lateral flexion 50%  Right rotation   Left rotation    (Blank rows = not tested)  LOWER EXTREMITY ROM:     Active  Right eval Left eval  Hip flexion    Hip extension    Hip abduction    Hip adduction    Hip internal rotation    Hip external rotation    Knee flexion    Knee extension    Ankle dorsiflexion    Ankle plantarflexion    Ankle inversion    Ankle eversion     (Blank rows = not tested)  LOWER EXTREMITY MMT:    MMT Right eval Left eval  Hip flexion 4- 4-  Hip extension    Hip abduction 4- 4-  Hip adduction    Hip internal rotation    Hip external rotation    Knee flexion    Knee extension    Ankle dorsiflexion    Ankle plantarflexion    Ankle inversion    Ankle eversion     (Blank rows = not tested)  LUMBAR SPECIAL TESTS:  Straight leg raise test: Positive and Slump test: Positive  FUNCTIONAL TESTS:  Timed up and go (TUG): 13 s  GAIT: Distance walked: 60 feet Assistive device utilized: None Level of assistance: Complete Independence Comments:   TREATMENT DATE 03/23/24 NuStep L5x2mins Calf stretch 30s x2 Rows and lats 25# 2x10 Passive LE stretching- HS, ITB, glutes, figure 4, SKTC, LTR  SLR 2x10 SL clamshells green 2x10 STS with OHP 2x10 Pball flexion x10 Leg press 20# 2x10  03/15/24 NuStep L5 x23mins  Calf stretch 30s x2  Straight arm pulls 10# 2x10 Supine stretches- LTR, HS, glutes, figure 4, ITB, SKTC  Feet on ball rotations, knees to chest Bridges 2x10 Seated piriformis and HS stretch  STS 2x10 Leg press 20# 2x10   03/07/24 Nustep level 5 x 6 minutes Calf stretches Feet on ball K2C, rotation, bridges, isometric abs Passive stretch LE's Reviewed HEP and talked more about the need  for flexibility, discussed posture for sleeping to decrease the stress  on the back with pillow b/n knees STM with T-gun to the lumbar, buttocks and HS  03/02/24 Evaluation HEP                                                                                                                                 PATIENT EDUCATION:  Education details: POC/HEP Person educated: Patient Education method: Programmer, multimedia, Facilities manager, Verbal cues, and Handouts Education comprehension: verbalized understanding  HOME EXERCISE PROGRAM: Access Code: [email or web address] URL: [email or web address] Date: 03/02/2024 Prepared by: Ozell Mainland  Exercises - Supine Lower Trunk Rotation  - 2 x daily - 7 x weekly - 1 sets - 10 reps - 10 hold - Hooklying Single Knee to Chest Stretch  - 2 x daily - 7 x weekly - 1 sets - 5 reps - 20 hold - Supine Piriformis Stretch Pulling Heel to Hip  - 2 x daily - 7 x weekly - 1 sets - 5 reps - 20 hold - Seated Hamstring Stretch with Chair  - 2 x daily - 7 x weekly - 1 sets - 5 reps - 20 hold - Standing Bilateral Gastroc Stretch with Step  - 2 x daily - 7 x weekly - 1 sets - 5 reps - 20 hold  ASSESSMENT:  CLINICAL IMPRESSION: Continued with exercises, and stretching to focus on the tight LE's. He is pretty tight and sore in HS, calves and piriformis. Tolerated well and reports he is feeling better overall. Patient would like to be put on a 2 week hold to see how he feels by continuing to do exercises at home on his own.    OBJECTIVE IMPAIRMENTS: Abnormal gait, cardiopulmonary status limiting activity, decreased activity tolerance, decreased balance, decreased coordination, decreased endurance, decreased mobility, difficulty walking, decreased ROM, decreased strength, increased muscle spasms, impaired flexibility, improper body mechanics, postural dysfunction, and pain.   REHAB POTENTIAL: Good  CLINICAL DECISION MAKING: Stable/uncomplicated  EVALUATION COMPLEXITY: Low   GOALS: Goals reviewed with patient? Yes  SHORT TERM GOALS: Target  date: 03/24/24  Independent with initial HEP Baseline: Goal status: progressing 03/07/24, MET 03/23/24  LONG TERM GOALS: Target date: 06/02/24  Independent with advanced HEP Baseline:  Goal status: INITIAL  2.  Understand proper posture and body mechanics Baseline:  Goal status: INITIAL  3.  Increase SLR to 60 degrees Baseline: 45 degrees Goal status: INITIAL  4.  Decrease pain 50% Baseline:  Goal status: MET 03/23/24  5.  Sleep throughout the night Baseline: wakes up about 4 x due to discomfort Goal status: INITIAL   PLAN:  PT FREQUENCY: 1x/week  PT DURATION: 12 weeks  PLANNED INTERVENTIONS: 97164- PT Re-evaluation, 97110-Therapeutic exercises, 97530- Therapeutic activity, 97112- Neuromuscular re-education, 97535- Self Care, 02859- Manual therapy, G0283- Electrical stimulation (unattended), [id number]- Ultrasound, 02987- Traction (mechanical), [id number]- Ionotophoresis 4mg /ml Dexamethasone, Patient/Family education, Balance training, Taping, Joint mobilization, Spinal mobilization, Cryotherapy, and Moist heat.  PLAN  FOR NEXT SESSION: gym, focus on flexibility   Almetta Fam, PT 03/23/2024, 9:20 AM

## 2024-03-23 ENCOUNTER — Ambulatory Visit

## 2024-03-23 DIAGNOSIS — M5459 Other low back pain: Secondary | ICD-10-CM

## 2024-03-23 DIAGNOSIS — M6281 Muscle weakness (generalized): Secondary | ICD-10-CM

## 2024-03-23 DIAGNOSIS — R262 Difficulty in walking, not elsewhere classified: Secondary | ICD-10-CM

## 2024-04-12 DIAGNOSIS — R1084 Generalized abdominal pain: Secondary | ICD-10-CM | POA: Diagnosis not present

## 2024-04-12 DIAGNOSIS — N50811 Right testicular pain: Secondary | ICD-10-CM | POA: Diagnosis not present

## 2024-04-12 DIAGNOSIS — N281 Cyst of kidney, acquired: Secondary | ICD-10-CM | POA: Diagnosis not present

## 2024-05-12 DIAGNOSIS — R399 Unspecified symptoms and signs involving the genitourinary system: Secondary | ICD-10-CM | POA: Diagnosis not present

## 2024-05-12 DIAGNOSIS — N453 Epididymo-orchitis: Secondary | ICD-10-CM | POA: Diagnosis not present

## 2024-05-16 ENCOUNTER — Other Ambulatory Visit: Payer: Self-pay | Admitting: Family Medicine

## 2024-05-25 DIAGNOSIS — H401112 Primary open-angle glaucoma, right eye, moderate stage: Secondary | ICD-10-CM | POA: Diagnosis not present

## 2024-05-25 DIAGNOSIS — Z961 Presence of intraocular lens: Secondary | ICD-10-CM | POA: Diagnosis not present

## 2024-07-05 ENCOUNTER — Ambulatory Visit: Admitting: Family Medicine

## 2024-07-05 VITALS — BP 146/70 | HR 76 | Temp 97.4°F | Wt 182.4 lb

## 2024-07-05 DIAGNOSIS — M545 Low back pain, unspecified: Secondary | ICD-10-CM

## 2024-07-05 MED ORDER — GABAPENTIN 100 MG PO CAPS: 100.0000 mg | ORAL_CAPSULE | Freq: Every day | ORAL | 3 refills | Status: AC

## 2024-07-05 NOTE — Patient Instructions (Signed)
 Try the Gabapentin  at night.  Start with one at night and may increase to two if needed.   I have set up PT referral.

## 2024-07-05 NOTE — Progress Notes (Signed)
 Established Patient Office Visit  Subjective   Patient ID: Jermaine Brady, male    DOB: Sep 29, 1950  Age: 73 y.o. MRN: [id number]  Chief Complaint  Patient presents with   Hip Pain        Back Pain    HPI   Jermaine Brady is seen with recurrence of left lower back pain with some radiation toward the left lateral hip region.  He had very similar flareup last July.  He did not going to physical therapy at [facility] and fell like he had great success with physical therapy and is requesting referral back.  He describes the pain as a dull ache which is frequently worse at night and sometimes interfering with sleep.  He tried Tylenol  with minimal improvement.  Denies any lower extremity numbness.  No urine or stool incontinence.  Tries to avoid regular nonsteroidals because of his blood pressure.  No definite weakness.  Denies any pain with back extension.  Can ambulate without much difficulty.  He does have history of B12 deficiency and is on replacement.  Past Medical History:  Diagnosis Date   ALLERGIC RHINITIS 10/23/2008   Allergy    seasonal   Arthritis    neck (05/19/2016)   Cataract    bilateral,removed   DIVERTICULOSIS, COLON 10/23/2008   Heart murmur    Hepatitis C    don't know when I contracted this; went thru treatment in ~ 2007-2008 =cured per pt   History of blood transfusion 2008?   during tx to cure my Hep C   History of kidney stones    HYPERTENSION 10/23/2008   Past Surgical History:  Procedure Laterality Date   ABDOMINAL HERNIA REPAIR  2005   ASD REPAIR  1971   ASD REPAIR  age 12   CARDIAC CATHETERIZATION  1960s - 19s X 2   at [facility]   cataracts Bilateral    COLON SURGERY  2007   ,resection, diverticular rupture   COLONOSCOPY  06/12/2015   HERNIA REPAIR  2005   POLYPECTOMY     SHOULDER ARTHROSCOPY W/ ROTATOR CUFF REPAIR Right 2014    reports that he has never smoked. He has been exposed to tobacco smoke. He has never used smokeless tobacco. He  reports current alcohol use of about 2.0 standard drinks of alcohol per week. He reports that he does not use drugs. family history includes Alcohol abuse in his father and another family member; Heart disease in an other family member. No Known Allergies  Review of Systems  Constitutional:  Negative for chills and fever.  Cardiovascular:  Negative for chest pain.  Musculoskeletal:  Positive for back pain.  Neurological:  Negative for sensory change and focal weakness.      Objective:     BP (!) 146/70   Pulse 76   Temp (!) 97.4 F (36.3 C) (Oral)   Wt 182 lb 6.4 oz (82.7 kg)   SpO2 97%   BMI 24.06 kg/m  BP Readings from Last 3 Encounters:  07/05/24 (!) 146/70  02/17/24 116/60  12/15/23 (!) 144/72   Wt Readings from Last 3 Encounters:  07/05/24 182 lb 6.4 oz (82.7 kg)  02/17/24 179 lb 8 oz (81.4 kg)  12/15/23 185 lb (83.9 kg)      Physical Exam Vitals reviewed.  Constitutional:      General: He is not in acute distress.    Appearance: He is not ill-appearing.  Cardiovascular:     Rate and Rhythm: Normal rate and  regular rhythm.  Musculoskeletal:     Comments: Left hip reveals excellent range of motion.  No tenderness over the lateral hip greater trochanteric bursa region.  Straight leg raises are negative bilaterally.  Neurological:     Mental Status: He is alert.     Comments: Full strength lower extremity with plantarflexion, dorsiflexion, knee extension, and hip flexion.  He has 2+ reflexes knee and ankle bilaterally.  Normal sensory function to touch throughout      No results found for any visits on 07/05/24.    The ASCVD Risk score (Arnett DK, et al., 2019) failed to calculate for the following reasons:   Cannot find a previous HDL lab   Cannot find a previous total cholesterol lab    Assessment & Plan:   Left lumbar and lateral hip pain.  Present for 3 weeks.  Similar pain in the past.  Denies any recent injury.  No evidence for trochanteric  bursitis.  Excellent range of motion of the left hip and doubt significant osteoarthritis issues.  Nonfocal neuroexam.  He has benefited from physical therapy in the past.  We suggested the following  -Continue Tylenol  every 6 hours as needed - Set up PT - We did discuss possible short-term use of gabapentin  100 mg nightly at night and if no relief with 1 tablet go to 2 tablets.  Caution about dizziness and sedation with this. - If no improvement with PT recommend follow-up and consider further imaging   No follow-ups on file.    Wolm Scarlet, MD

## 2024-07-18 ENCOUNTER — Ambulatory Visit: Payer: Self-pay

## 2024-07-18 NOTE — Telephone Encounter (Signed)
" °  FYI Only or Action Required?: Action required by provider: clinical question for provider and Medication Request.  Patient was last seen in primary care on 07/05/2024 by Micheal Wolm ORN, MD.  Called Nurse Triage reporting Hip Pain.  Symptoms began yesterday.  Interventions attempted: OTC medications: Tylenol  and Prescription medications: Gabapentin .  Symptoms are: gradually worsening.  Triage Disposition: See PCP When Office is Open (Within 3 Days)  Patient/caregiver understands and will follow disposition?: Yes  Message from [facility] sent at 07/18/2024 10:51 AM EST  Reason for Triage: Reason for Triage: Patient called and stated his left hip pain is worse. Has seen his provider about the issue and he sent him a referral to PT however, that appt is not until 08/03/2024 and does not think he could wait that long. Denied being severe but pain is rated a 3/4 out of 10. Has issues sleeping due to the aching pain.   Reason for Disposition  [1] MODERATE pain (e.g., interferes with normal activities, limping) AND [2] present > 3 days  Answer Assessment - Initial Assessment Questions 1. LOCATION and RADIATION: Where is the pain located? Does the pain spread (shoot) anywhere else?     L hip  2. QUALITY: What does the pain feel like?  (e.g., sharp, dull, aching, burning)      Constant dull pain- resolved some now  3. SEVERITY: How bad is the pain? What does it keep you from doing?   (Scale 1-10; or mild, moderate, severe)      1/10  4. ONSET: When did the pain start? Does it come and go, or is it there all the time?      Worse yesterday  5. WORK OR EXERCISE: Has there been any recent work or exercise that involved this part of the body?       Denies work/exercise  6. CAUSE: What do you think is causing the hip pain?      Pt unsure  7. AGGRAVATING FACTORS: What makes the hip pain worse? (e.g., walking, climbing stairs, running)      Going up steps worsens  symptoms, pt has pain in the joint.   Pt states he also has constant pain that accompanies above pain  8. OTHER SYMPTOMS: Pt denies back pain, pain shooting down leg,  fever, rash        Pt reports L hip pain Pt is taking OTC Tylenol  and Gabapentin  Pt states current regimen works a bit, however, does not help relieve constant pain he is having. Pt states the constant pain that is keeping him awake all night is what patient is having difficulty with.  Pt requesting additional medication to assist with constant pain. Pt is scheduled for PT, however, does not have an appt until 08/04/2023. Pt seen in clinic on 12.09.25 for same symptoms and would prefer not to come back into clinic. Pt agrees with plan of care, will call back for any worsening symptoms  Protocols used: Hip Pain-A-AH  "

## 2024-07-19 MED ORDER — HYDROCODONE-ACETAMINOPHEN 5-325 MG PO TABS: 1.0000 | ORAL_TABLET | Freq: Four times a day (QID) | ORAL | 0 refills | Status: AC | PRN

## 2024-07-19 NOTE — Telephone Encounter (Signed)
 Left detailed message informing patient this rx has been sent to pharmacy.

## 2024-07-19 NOTE — Telephone Encounter (Signed)
 I spoke with the patient and he reported he has tried previously and Tramadol did not work for him and caused insomnia.

## 2024-07-19 NOTE — Telephone Encounter (Signed)
 I tried to send in some hydrocodone  to take 1 every 6 hours as needed for severe pain but for some reason this would not transmit electronically.  This is printed out and hopefully we can fax over his pharmacy  Wolm LELON Scarlet MD [facility]

## 2024-08-03 ENCOUNTER — Ambulatory Visit: Attending: Family Medicine

## 2024-08-03 DIAGNOSIS — M6289 Other specified disorders of muscle: Secondary | ICD-10-CM | POA: Diagnosis present

## 2024-08-03 DIAGNOSIS — M25552 Pain in left hip: Secondary | ICD-10-CM | POA: Diagnosis present

## 2024-08-03 DIAGNOSIS — M5459 Other low back pain: Secondary | ICD-10-CM | POA: Insufficient documentation

## 2024-08-03 DIAGNOSIS — R262 Difficulty in walking, not elsewhere classified: Secondary | ICD-10-CM | POA: Insufficient documentation

## 2024-08-03 DIAGNOSIS — M6281 Muscle weakness (generalized): Secondary | ICD-10-CM | POA: Insufficient documentation

## 2024-08-03 DIAGNOSIS — M545 Low back pain, unspecified: Secondary | ICD-10-CM | POA: Insufficient documentation

## 2024-08-03 NOTE — Therapy (Signed)
 " OUTPATIENT PHYSICAL THERAPY THORACOLUMBAR EVALUATION   Patient Name: Jermaine Brady MRN: [id number] DOB:August 24, 1950, 74 y.o., male Today's Date: 08/03/2024  END OF SESSION:  PT End of Session - 08/03/24 0849     Visit Number 1    Date for Recertification  10/12/24    Authorization Type [employer]    PT Start Time 0845    PT Stop Time 0915    PT Time Calculation (min) 30 min    Activity Tolerance Patient tolerated treatment well    Behavior During Therapy [facility] for tasks assessed/performed         Past Medical History:  Diagnosis Date   ALLERGIC RHINITIS 10/23/2008   Allergy    seasonal   Arthritis    neck (05/19/2016)   Cataract    bilateral,removed   DIVERTICULOSIS, COLON 10/23/2008   Heart murmur    Hepatitis C    don't know when I contracted this; went thru treatment in ~ 2007-2008 =cured per pt   History of blood transfusion 2008?   during tx to cure my Hep C   History of kidney stones    HYPERTENSION 10/23/2008   Past Surgical History:  Procedure Laterality Date   ABDOMINAL HERNIA REPAIR  2005   ASD REPAIR  1971   ASD REPAIR  age 46   CARDIAC CATHETERIZATION  1960s - 81s X 2   at [facility]   cataracts Bilateral    COLON SURGERY  2007   ,resection, diverticular rupture   COLONOSCOPY  06/12/2015   HERNIA REPAIR  2005   POLYPECTOMY     SHOULDER ARTHROSCOPY W/ ROTATOR CUFF REPAIR Right 2014   Patient Active Problem List   Diagnosis Date Noted   BPH (benign prostatic hyperplasia) 11/10/2018   Mixed conductive and sensorineural hearing loss of left ear with restricted hearing of right ear 01/03/2018   Sensorineural hearing loss (SNHL) of both ears 12/29/2017   Viral upper respiratory tract infection 12/29/2017   Enlarged prostate 12/19/2017   Seasonal allergies 12/19/2017   Chest pain 05/19/2016   Hand injury 04/09/2013   Hepatitis C 11/06/2010   CELLULITIS, HAND, LEFT 01/07/2010   Essential hypertension 10/23/2008   Allergic rhinitis  10/23/2008   Diverticulosis of colon 10/23/2008   History of colonic polyps 10/23/2008    PCP: Micheal Wolm ORN, MD   REFERRING PROVIDER: Micheal Wolm ORN, MD   REFERRING DIAG:  Diagnosis  M54.50 (ICD-10-CM) - Lumbar pain   Rationale for Evaluation and Treatment: Rehabilitation  THERAPY DIAG:  Pain in left hip  Muscle tightness  Muscle weakness (generalized)  ONSET DATE: 07/05/2024  SUBJECTIVE:  SUBJECTIVE STATEMENT: Pt reports pain is more central to the hip L side > right than the back; pain rated at 1/10 today. Pt states he did not really keep up with HEP long after previous PT encounter and feels his hip has tightened again. Pt stated current symptoms have not stopped him from being able to perform at work.   PERTINENT HISTORY:  See PMH chart above   PAIN:  Are you having pain? Yes: NPRS scale: 1/10 Pain location: L hip Pain description: Discomfort, tightness Aggravating factors: prolonged sitting  Relieving factors: Rest   PRECAUTIONS: None  RED FLAGS: None   WEIGHT BEARING RESTRICTIONS: No  FALLS:  Has patient fallen in last 6 months? 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   COGNITION: Overall cognitive status: Within functional limits for tasks assessed     SENSATION: WFL    POSTURE: No Significant postural limitations   LUMBAR ROM:    AROM eval  Flexion To mid shin bilaterally   Extension WNL  Right lateral flexion WNL  Left lateral flexion WNL  Right rotation WNL  Left rotation WNL   (Blank rows = not tested)  LOWER EXTREMITY ROM:     WNL; straight leg hip flexion 60d bilaterally   LOWER EXTREMITY MMT:    MMT Right eval Left eval  Hip flexion 4/5 4/5  Hip extension    Hip abduction 5/5 5/5  Hip adduction 5/5 5/5  Hip internal rotation    Hip external rotation    Knee flexion 5/5 5/5  Knee extension 5/5 5/5  Ankle dorsiflexion 5/5 5/5  Ankle plantarflexion    Ankle inversion    Ankle eversion     (Blank rows = not tested)  LUMBAR SPECIAL TESTS:  Straight leg raise test: Positive  FUNCTIONAL TESTS:  30 seconds chair stand test: 9 STS  GAIT: Distance walked: In Clinic Assistive device utilized: None Level of assistance: Complete Independence Comments: No significant gait deficits noted   TREATMENT DATE:   08/03/24-Eval                                                                                                                                  PATIENT EDUCATION:  Education details: HEP Person educated: Patient Education method: Medical Illustrator Education comprehension: verbalized understanding and returned demonstration  HOME EXERCISE PROGRAM: Access Code: [email or web address] URL: [email or web address] Date: 08/03/2024 Prepared by: Almetta Fam  Exercises - Supine Lower Trunk Rotation  - 2 x daily - 7 x weekly - 1 sets - 10 reps - 10 hold - Hooklying  Single Knee to Chest Stretch  - 2 x daily - 7 x weekly - 1 sets - 5 reps - 20 hold - Supine Piriformis Stretch Pulling Heel to Hip  - 2 x daily - 7 x weekly - 1 sets - 5 reps - 20 hold - Seated Hamstring Stretch with Chair  - 2 x daily - 7 x weekly - 1 sets - 5 reps - 20 hold - Standing Bilateral Gastroc Stretch with Step  - 2 x daily - 7 x weekly - 1 sets - 5 reps - 20 hold - Modified Thomas Stretch  - 1 x daily - 7  x weekly - 1 sets - 5 reps - 20 hold - Supine Hamstring Stretch with Strap  - 1 x daily - 7 x weekly - 1 sets - 5 reps - 20 hold - Supine Active Straight Leg Raise  - 1 x daily - 7 x weekly - 2 sets - 10 reps - Supine Bridge  - 1 x daily - 7 x weekly - 2 sets - 10 reps  ASSESSMENT:  CLINICAL IMPRESSION: Patient is a 74 y.o. male who was seen today for physical therapy evaluation and treatment for R hip pain and low back tightness. Pt R hip pain > low back. Pt exhibits tightness in HS musculature bilaterally and presents with tight hip flexors. Pt will benefit from stretching to mitigate joint stiffness and directed exercises to strengthen posterior chain musculature. Pt still able to perform ADLs and job duties despite symptoms but will benefit from skilled therapy to improve quality of movement, increase functional endurance, and mitigate joint stiffness.   OBJECTIVE IMPAIRMENTS: decreased activity tolerance, decreased endurance, and pain.   ACTIVITY LIMITATIONS: lifting, bending, sitting, and squatting   REHAB POTENTIAL: Good  CLINICAL DECISION MAKING: Stable/uncomplicated  EVALUATION COMPLEXITY: Low   GOALS: Goals reviewed with patient? Yes  SHORT TERM GOALS: Target date: 09/07/24  Patient will be independent with initial HEP.  Baseline:  Goal status: INITIAL   LONG TERM GOALS: Target date: 10/12/24  Patient will be independent with advanced/ongoing HEP to improve outcomes and carryover.  Baseline:  Goal status: INITIAL  2.  Patient will demonstrate full pain free lumbar/R hip ROM to perform ADLs.   Baseline:  Goal status: INITIAL  3.  Patient will demonstrate improved functional strength as demonstrated by 5/5 Hip flexion strength. Baseline:  Goal status: INITIAL  4.  Patient will tolerate 30 min of (standing/sitting/walking) to perform daily activities. Baseline:  Goal status: INITIAL  5. Patient to demonstrate ability to achieve and maintain good spinal  alignment/posturing and body mechanics needed for daily activities. Baseline:  Goal status: INITIAL  6.  Pt will perform > 12 STS in a 30s timeframe  Baseline: 9 Goal status: INITIAL  PLAN:  PT FREQUENCY: 1x/week  PT DURATION: 10 weeks  PLANNED INTERVENTIONS: 97110-Therapeutic exercises, 97530- Therapeutic activity, [id number]- Neuromuscular re-education, 97535- Self Care, [id number]- Manual therapy, [phone]- Gait training, and Patient/Family education.  PLAN FOR NEXT SESSION: Strengthen posterior chain musculature, stretching HS and low back, general functional mobility activities    Thersia Alder, Student-PT 08/03/2024, 9:17 AM  "

## 2024-08-10 ENCOUNTER — Ambulatory Visit

## 2024-08-10 DIAGNOSIS — M5459 Other low back pain: Secondary | ICD-10-CM

## 2024-08-10 DIAGNOSIS — M25552 Pain in left hip: Secondary | ICD-10-CM

## 2024-08-10 DIAGNOSIS — M6281 Muscle weakness (generalized): Secondary | ICD-10-CM

## 2024-08-10 DIAGNOSIS — M6289 Other specified disorders of muscle: Secondary | ICD-10-CM

## 2024-08-10 DIAGNOSIS — R262 Difficulty in walking, not elsewhere classified: Secondary | ICD-10-CM

## 2024-08-10 NOTE — Therapy (Signed)
 " OUTPATIENT PHYSICAL THERAPY THORACOLUMBAR TREATMENT   Patient Name: Jermaine Brady MRN: [id number] DOB:06-19-1951, 74 y.o., male Today's Date: 08/10/2024  END OF SESSION:  PT End of Session - 08/10/24 1227     Visit Number 2    Date for Recertification  10/12/24    Authorization Type [employer]    PT Start Time 1100    PT Stop Time 1142    PT Time Calculation (min) 42 min    Activity Tolerance Patient tolerated treatment well    Behavior During Therapy WFL for tasks assessed/performed          Past Medical History:  Diagnosis Date   ALLERGIC RHINITIS 10/23/2008   Allergy    seasonal   Arthritis    neck (05/19/2016)   Cataract    bilateral,removed   DIVERTICULOSIS, COLON 10/23/2008   Heart murmur    Hepatitis C    don't know when I contracted this; went thru treatment in ~ 2007-2008 =cured per pt   History of blood transfusion 2008?   during tx to cure my Hep C   History of kidney stones    HYPERTENSION 10/23/2008   Past Surgical History:  Procedure Laterality Date   ABDOMINAL HERNIA REPAIR  2005   ASD REPAIR  1971   ASD REPAIR  age 54   CARDIAC CATHETERIZATION  1960s - 59s X 2   at [facility]   cataracts Bilateral    COLON SURGERY  2007   ,resection, diverticular rupture   COLONOSCOPY  06/12/2015   HERNIA REPAIR  2005   POLYPECTOMY     SHOULDER ARTHROSCOPY W/ ROTATOR CUFF REPAIR Right 2014   Patient Active Problem List   Diagnosis Date Noted   BPH (benign prostatic hyperplasia) 11/10/2018   Mixed conductive and sensorineural hearing loss of left ear with restricted hearing of right ear 01/03/2018   Sensorineural hearing loss (SNHL) of both ears 12/29/2017   Viral upper respiratory tract infection 12/29/2017   Enlarged prostate 12/19/2017   Seasonal allergies 12/19/2017   Chest pain 05/19/2016   Hand injury 04/09/2013   Hepatitis C 11/06/2010   CELLULITIS, HAND, LEFT 01/07/2010   Essential hypertension 10/23/2008   Allergic  rhinitis 10/23/2008   Diverticulosis of colon 10/23/2008   History of colonic polyps 10/23/2008    PCP: Micheal Wolm ORN, MD   REFERRING PROVIDER: Micheal Wolm ORN, MD   REFERRING DIAG:  Diagnosis  M54.50 (ICD-10-CM) - Lumbar pain   Rationale for Evaluation and Treatment: Rehabilitation  THERAPY DIAG:  Pain in left hip  Other low back pain  Difficulty in walking, not elsewhere classified  Muscle tightness  Muscle weakness (generalized)  ONSET DATE: 07/05/2024  SUBJECTIVE:  SUBJECTIVE STATEMENT:  Pt reports the L hip pain comes and goes and is unpredictable. Pt states he has been routinely doing initial HEP.     Eval: Pt reports pain is more central to the hip L side > right than the back; pain rated at 1/10 today. Pt states he did not really keep up with HEP long after previous PT encounter and feels his hip has tightened again. Pt stated current symptoms have not stopped him from being able to perform at work.   PERTINENT HISTORY:  See PMH chart above   PAIN:  Are you having pain? Yes: NPRS scale: 1/10 Pain location: L hip Pain description: Discomfort, tightness Aggravating factors: prolonged sitting  Relieving factors: Rest   PRECAUTIONS: None  RED FLAGS: None   WEIGHT BEARING RESTRICTIONS: No  FALLS:  Has patient fallen in last 6 months? 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   COGNITION: Overall cognitive status: Within functional limits for tasks assessed     SENSATION: WFL    POSTURE: No Significant postural limitations   LUMBAR ROM:   AROM eval  Flexion To mid shin bilaterally   Extension WNL  Right lateral flexion WNL  Left lateral flexion WNL  Right rotation WNL  Left rotation WNL   (Blank rows = not tested)  LOWER EXTREMITY ROM:     WNL; straight leg hip flexion 60d bilaterally   LOWER EXTREMITY MMT:    MMT Right eval Left eval  Hip flexion 4/5 4/5  Hip extension    Hip abduction 5/5 5/5  Hip adduction 5/5 5/5  Hip internal rotation    Hip external rotation    Knee flexion 5/5 5/5  Knee extension 5/5 5/5  Ankle dorsiflexion 5/5 5/5  Ankle plantarflexion    Ankle inversion    Ankle eversion     (Blank rows = not tested)  LUMBAR SPECIAL TESTS:  Straight leg raise test: Positive  FUNCTIONAL TESTS:  30 seconds chair stand test: 9 STS  GAIT: Distance walked: In Clinic Assistive device utilized: None Level of assistance: Complete Independence Comments: No significant gait deficits noted   TREATMENT DATE:  08/10/24 NuStep L5 x  Step Ups 6 2x10 Core Cable Rotations 2x10 10# STS w/ yellow weighted ball 4.4# by chest 2x10 Lateral Band Walking BlueTband HS Curls Blue tband 2x10 Hip Adduction Blue ball 2x10 Hip abduction Blue Tband  Calf stretch elevated board 2 x 20s HS Stretch seated and lying Knee to chest stretch FABER stretch Lumbar rotation stretch (windshield wipers)    08/03/24-Eval  PATIENT EDUCATION:  Education details: HEP Person educated: Patient Education method: Medical Illustrator Education comprehension: verbalized understanding and returned demonstration  HOME EXERCISE PROGRAM: Access Code: [email or web address] URL:  [email or web address] Date: 08/03/2024 Prepared by: Almetta Fam  Exercises - Supine Lower Trunk Rotation  - 2 x daily - 7 x weekly - 1 sets - 10 reps - 10 hold - Hooklying Single Knee to Chest Stretch  - 2 x daily - 7 x weekly - 1 sets - 5 reps - 20 hold - Supine Piriformis Stretch Pulling Heel to Hip  - 2 x daily - 7 x weekly - 1 sets - 5 reps - 20 hold - Seated Hamstring Stretch with Chair  - 2 x daily - 7 x weekly - 1 sets - 5 reps - 20 hold - Standing Bilateral Gastroc Stretch with Step  - 2 x daily - 7 x weekly - 1 sets - 5 reps - 20 hold - Modified Thomas Stretch  - 1 x daily - 7 x weekly - 1 sets - 5 reps - 20 hold - Supine Hamstring Stretch with Strap  - 1 x daily - 7 x weekly - 1 sets - 5 reps - 20 hold - Supine Active Straight Leg Raise  - 1 x daily - 7 x weekly - 2 sets - 10 reps - Supine Bridge  - 1 x daily - 7 x weekly - 2 sets - 10 reps  ASSESSMENT:  CLINICAL IMPRESSION: Pt tolerated treatment well with no increase in pain symptoms. Pt doing well with initial HEP maintenance. Pt exhibits tightness in HS musculature and calves bilaterally. Session focused on functional movement patterns and strengthening. Session ended with stretching in order to mitigate muscle tightness and joint stiffness. Pt requires cues for foot placement and hip alignment during dynamic activities; able to self correct with verbal cues.   Patient is a 74 y.o. male who was seen today for physical therapy evaluation and treatment for R hip pain and low back tightness. Pt R hip pain > low back. Pt exhibits tightness in HS musculature bilaterally and presents with tight hip flexors. Pt will benefit from stretching to mitigate joint stiffness and directed exercises to strengthen posterior chain musculature. Pt still able to perform ADLs and job duties despite symptoms but will benefit from skilled therapy to improve quality of movement, increase functional endurance, and mitigate joint stiffness.    OBJECTIVE IMPAIRMENTS: decreased activity tolerance, decreased endurance, and pain.   ACTIVITY LIMITATIONS: lifting, bending, sitting, and squatting   REHAB POTENTIAL: Good  CLINICAL DECISION MAKING: Stable/uncomplicated  EVALUATION COMPLEXITY: Low   GOALS: Goals reviewed with patient? Yes  SHORT TERM GOALS: Target date: 09/07/24  Patient will be independent with initial HEP.  Baseline:  Goal status: MET doing initial HEP twice a day 08/10/24   LONG TERM GOALS: Target date: 10/12/24  Patient will be independent with advanced/ongoing HEP to improve outcomes and carryover.  Baseline:  Goal status: IN PROGRESS 08/10/24  2.  Patient will demonstrate full pain free lumbar/R hip ROM to perform ADLs.   Baseline:  Goal status: IN PROGRESS pain not completely gone 08/10/24  3.  Patient will demonstrate improved functional strength as demonstrated by 5/5 Hip flexion strength. Baseline:  Goal status: INITIAL  4.  Patient will tolerate 30 min of (standing/sitting/walking) to perform daily activities. Baseline:  Goal status: IN PROGRESS <30 min 08/10/24  5. Patient to demonstrate ability to achieve and maintain good spinal alignment/posturing and body mechanics needed for  daily activities. Baseline:  Goal status: IN PROGRESS requires cues 08/10/24  6.  Pt will perform > 12 STS in a 30s timeframe  Baseline: 9 Goal status: INITIAL  PLAN:  PT FREQUENCY: 1x/week  PT DURATION: 10 weeks  PLANNED INTERVENTIONS: 97110-Therapeutic exercises, 97530- Therapeutic activity, [id number]- Neuromuscular re-education, 97535- Self Care, 02859- Manual therapy, [phone]- Gait training, and Patient/Family education.  PLAN FOR NEXT SESSION: Strengthen posterior chain musculature, stretching HS and low back, general functional mobility activities    Thersia Alder, Student-PT 08/10/2024, 12:30 PM  "

## 2024-08-16 ENCOUNTER — Ambulatory Visit: Admitting: Physical Therapy

## 2024-08-16 ENCOUNTER — Encounter: Payer: Self-pay | Admitting: Physical Therapy

## 2024-08-16 DIAGNOSIS — M25552 Pain in left hip: Secondary | ICD-10-CM | POA: Diagnosis not present

## 2024-08-16 DIAGNOSIS — M5459 Other low back pain: Secondary | ICD-10-CM

## 2024-08-16 DIAGNOSIS — R262 Difficulty in walking, not elsewhere classified: Secondary | ICD-10-CM

## 2024-08-16 DIAGNOSIS — M6281 Muscle weakness (generalized): Secondary | ICD-10-CM

## 2024-08-16 DIAGNOSIS — M6289 Other specified disorders of muscle: Secondary | ICD-10-CM

## 2024-08-16 NOTE — Therapy (Signed)
 " OUTPATIENT PHYSICAL THERAPY THORACOLUMBAR TREATMENT   Patient Name: Jermaine Brady MRN: [id number] DOB:1951/04/21, 74 y.o., male Today's Date: 08/16/2024  END OF SESSION:  PT End of Session - 08/16/24 0757     Visit Number 3    Date for Recertification  10/12/24          Past Medical History:  Diagnosis Date   ALLERGIC RHINITIS 10/23/2008   Allergy    seasonal   Arthritis    neck (05/19/2016)   Cataract    bilateral,removed   DIVERTICULOSIS, COLON 10/23/2008   Heart murmur    Hepatitis C    don't know when I contracted this; went thru treatment in ~ 2007-2008 =cured per pt   History of blood transfusion 2008?   during tx to cure my Hep C   History of kidney stones    HYPERTENSION 10/23/2008   Past Surgical History:  Procedure Laterality Date   ABDOMINAL HERNIA REPAIR  2005   ASD REPAIR  1971   ASD REPAIR  age 32   CARDIAC CATHETERIZATION  1960s - 48s X 2   at [facility]   cataracts Bilateral    COLON SURGERY  2007   ,resection, diverticular rupture   COLONOSCOPY  06/12/2015   HERNIA REPAIR  2005   POLYPECTOMY     SHOULDER ARTHROSCOPY W/ ROTATOR CUFF REPAIR Right 2014   Patient Active Problem List   Diagnosis Date Noted   BPH (benign prostatic hyperplasia) 11/10/2018   Mixed conductive and sensorineural hearing loss of left ear with restricted hearing of right ear 01/03/2018   Sensorineural hearing loss (SNHL) of both ears 12/29/2017   Viral upper respiratory tract infection 12/29/2017   Enlarged prostate 12/19/2017   Seasonal allergies 12/19/2017   Chest pain 05/19/2016   Hand injury 04/09/2013   Hepatitis C 11/06/2010   CELLULITIS, HAND, LEFT 01/07/2010   Essential hypertension 10/23/2008   Allergic rhinitis 10/23/2008   Diverticulosis of colon 10/23/2008   History of colonic polyps 10/23/2008    PCP: Micheal Wolm ORN, MD   REFERRING PROVIDER: Micheal Wolm ORN, MD   REFERRING DIAG:  Diagnosis  M54.50 (ICD-10-CM) - Lumbar pain    Rationale for Evaluation and Treatment: Rehabilitation  THERAPY DIAG:  Pain in left hip  Other low back pain  Difficulty in walking, not elsewhere classified  Muscle tightness  Muscle weakness (generalized)  ONSET DATE: 07/05/2024  SUBJECTIVE:                                                                                                                                                                                           SUBJECTIVE  STATEMENT:  Doing pretty good today    Eval: Pt reports pain is more central to the hip L side > right than the back; pain rated at 1/10 today. Pt states he did not really keep up with HEP long after previous PT encounter and feels his hip has tightened again. Pt stated current symptoms have not stopped him from being able to perform at work.   PERTINENT HISTORY:  See PMH chart above   PAIN:  Are you having pain? Yes: NPRS scale: 1/10 Pain location: L hip Pain description: Discomfort, tightness Aggravating factors: prolonged sitting  Relieving factors: Rest   PRECAUTIONS: None  RED FLAGS: None   WEIGHT BEARING RESTRICTIONS: No  FALLS:  Has patient fallen in last 6 months? 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   COGNITION: Overall cognitive status: Within functional limits for tasks assessed     SENSATION: WFL    POSTURE: No Significant postural limitations   LUMBAR ROM:   AROM eval  Flexion To mid shin bilaterally   Extension WNL  Right lateral flexion WNL  Left lateral flexion WNL   Right rotation WNL  Left rotation WNL   (Blank rows = not tested)  LOWER EXTREMITY ROM:     WNL; straight leg hip flexion 60d bilaterally   LOWER EXTREMITY MMT:    MMT Right eval Left eval  Hip flexion 4/5 4/5  Hip extension    Hip abduction 5/5 5/5  Hip adduction 5/5 5/5  Hip internal rotation    Hip external rotation    Knee flexion 5/5 5/5  Knee extension 5/5 5/5  Ankle dorsiflexion 5/5 5/5  Ankle plantarflexion    Ankle inversion    Ankle eversion     (Blank rows = not tested)  LUMBAR SPECIAL TESTS:  Straight leg raise test: Positive  FUNCTIONAL TESTS:  30 seconds chair stand test: 9 STS  GAIT: Distance walked: In Clinic Assistive device utilized: None Level of assistance: Complete Independence Comments: No significant gait deficits noted   TREATMENT DATE:  08/16/24 NuStep L 5 x 6 min 30lb resisted side steps x5 lb 6in lateral step ups x10 each S2S holding blue ball 2x10 HS curls 35lb 2x10 Leg Ext 10lb 2x10 HS Stretch  Knee to chest stretch FABER stretch Lumbar rotation stretch (windshield wipers)  08/10/24 NuStep L5 x  Step Ups 6 2x10 Core Cable Rotations 2x10 10# STS w/ yellow weighted ball 4.4# by chest 2x10 Lateral Band Walking BlueTband HS Curls Blue tband 2x10 Hip Adduction Blue ball 2x10 Hip abduction Blue Tband  Calf stretch elevated board 2 x 20s HS Stretch seated and lying Knee to chest stretch FABER stretch Lumbar rotation stretch (windshield wipers)    08/03/24-Eval  PATIENT EDUCATION:  Education details: HEP Person educated: Patient Education method: Medical Illustrator Education comprehension: verbalized understanding and returned demonstration  HOME EXERCISE PROGRAM: Access Code: [email or web address] URL: [email or web address] Date: 08/03/2024 Prepared by: Almetta Fam  Exercises - Supine Lower Trunk Rotation  - 2 x daily - 7 x weekly - 1 sets - 10 reps - 10 hold - Hooklying Single Knee to Chest Stretch  - 2 x daily - 7 x weekly - 1 sets - 5 reps - 20 hold - Supine Piriformis Stretch Pulling Heel to Hip  - 2 x daily - 7 x weekly - 1 sets - 5 reps - 20 hold - Seated Hamstring Stretch with Chair  - 2 x daily - 7 x weekly - 1 sets - 5 reps - 20 hold - Standing Bilateral Gastroc Stretch with Step  - 2 x daily - 7 x weekly - 1 sets - 5 reps - 20 hold - Modified Thomas Stretch  - 1 x daily - 7 x weekly - 1 sets - 5 reps - 20 hold - Supine Hamstring Stretch with Strap  - 1 x daily - 7 x weekly - 1 sets - 5 reps - 20 hold - Supine Active Straight Leg Raise  - 1 x daily - 7 x weekly - 2 sets - 10 reps - Supine Bridge  - 1 x daily - 7 x weekly - 2 sets - 10 reps  ASSESSMENT:  CLINICAL IMPRESSION: Pt tolerated treatment well with no increase in pain symptoms.  Pt exhibits tightness in HS musculature and calves bilaterally.  Again session focused on functional movement patterns and strengthening. Some fatigue with sit to stands. SBA given with step ups.     Patient is a 74 y.o. male who was seen today for physical therapy evaluation and treatment for R hip pain and low back tightness. Pt R hip pain > low back. Pt exhibits tightness in HS musculature bilaterally and presents with tight hip flexors. Pt will benefit from stretching to mitigate joint stiffness and directed exercises to strengthen posterior chain musculature. Pt still able to perform ADLs and job duties despite symptoms but will benefit from skilled therapy to improve quality of movement, increase functional endurance, and mitigate joint stiffness.   OBJECTIVE IMPAIRMENTS: decreased activity tolerance, decreased endurance, and pain.   ACTIVITY LIMITATIONS: lifting, bending, sitting, and squatting   REHAB POTENTIAL: Good  CLINICAL DECISION MAKING: Stable/uncomplicated  EVALUATION COMPLEXITY:  Low   GOALS: Goals reviewed with patient? Yes  SHORT TERM GOALS: Target date: 09/07/24  Patient will be independent with initial HEP.  Baseline:  Goal status: MET doing initial HEP twice a day 08/10/24   LONG TERM GOALS: Target date: 10/12/24  Patient will be independent with advanced/ongoing HEP to improve outcomes and carryover.  Baseline:  Goal status: IN PROGRESS 08/10/24  2.  Patient will demonstrate full pain free lumbar/R hip ROM to perform ADLs.   Baseline:  Goal status: IN PROGRESS pain not completely gone 08/10/24  3.  Patient will demonstrate improved functional strength as demonstrated by 5/5 Hip flexion strength. Baseline:  Goal status: INITIAL  4.  Patient will tolerate 30 min of (standing/sitting/walking) to perform daily activities. Baseline:  Goal status: IN PROGRESS <30 min 08/10/24  5. Patient to demonstrate ability to achieve and maintain good spinal alignment/posturing and body mechanics needed for daily activities. Baseline:  Goal status: IN PROGRESS requires cues 08/10/24  6.  Pt will perform > 12 STS in a 30s  timeframe  Baseline: 9 Goal status: INITIAL  PLAN:  PT FREQUENCY: 1x/week  PT DURATION: 10 weeks  PLANNED INTERVENTIONS: 97110-Therapeutic exercises, 97530- Therapeutic activity, [id number]- Neuromuscular re-education, 97535- Self Care, [id number]- Manual therapy, [phone]- Gait training, and Patient/Family education.  PLAN FOR NEXT SESSION: Strengthen posterior chain musculature, stretching HS and low back, general functional mobility activities    Tanda KANDICE Sorrow, PTA 08/16/2024, 7:57 AM  "

## 2024-08-23 ENCOUNTER — Ambulatory Visit: Admitting: Physical Therapy

## 2024-08-23 ENCOUNTER — Encounter: Payer: Self-pay | Admitting: Physical Therapy

## 2024-08-23 DIAGNOSIS — M25552 Pain in left hip: Secondary | ICD-10-CM | POA: Diagnosis not present

## 2024-08-23 DIAGNOSIS — M5459 Other low back pain: Secondary | ICD-10-CM

## 2024-08-23 DIAGNOSIS — M6289 Other specified disorders of muscle: Secondary | ICD-10-CM

## 2024-08-23 DIAGNOSIS — R262 Difficulty in walking, not elsewhere classified: Secondary | ICD-10-CM

## 2024-08-23 DIAGNOSIS — M6281 Muscle weakness (generalized): Secondary | ICD-10-CM

## 2024-08-23 NOTE — Therapy (Signed)
 " OUTPATIENT PHYSICAL THERAPY THORACOLUMBAR TREATMENT   Patient Name: Jermaine Brady MRN: [id number] DOB:[birth year], 74 y.o., male Today's Date: 08/23/2024  END OF SESSION:  PT End of Session - 08/23/24 1141     Visit Number 4    Date for Recertification  10/12/24    PT Start Time 1138    PT Stop Time 1222    PT Time Calculation (min) 44 min    Activity Tolerance Patient tolerated treatment well    Behavior During Therapy [facility] for tasks assessed/performed          Past Medical History:  Diagnosis Date   ALLERGIC RHINITIS 10/23/2008   Allergy    seasonal   Arthritis    neck (05/19/2016)   Cataract    bilateral,removed   DIVERTICULOSIS, COLON 10/23/2008   Heart murmur    Hepatitis C    don't know when I contracted this; went thru treatment in ~ 2007-2008 =cured per pt   History of blood transfusion 2008?   during tx to cure my Hep C   History of kidney stones    HYPERTENSION 10/23/2008   Past Surgical History:  Procedure Laterality Date   ABDOMINAL HERNIA REPAIR  2005   ASD REPAIR  1971   ASD REPAIR  age 20   CARDIAC CATHETERIZATION  1960s - 33s X 2   at [facility]   cataracts Bilateral    COLON SURGERY  2007   ,resection, diverticular rupture   COLONOSCOPY  06/12/2015   HERNIA REPAIR  2005   POLYPECTOMY     SHOULDER ARTHROSCOPY W/ ROTATOR CUFF REPAIR Right 2014   Patient Active Problem List   Diagnosis Date Noted   BPH (benign prostatic hyperplasia) 11/10/2018   Mixed conductive and sensorineural hearing loss of left ear with restricted hearing of right ear 01/03/2018   Sensorineural hearing loss (SNHL) of both ears 12/29/2017   Viral upper respiratory tract infection 12/29/2017   Enlarged prostate 12/19/2017   Seasonal allergies 12/19/2017   Chest pain 05/19/2016   Hand injury 04/09/2013   Hepatitis C 11/06/2010   CELLULITIS, HAND, LEFT 01/07/2010   Essential hypertension 10/23/2008   Allergic rhinitis 10/23/2008   Diverticulosis of colon  10/23/2008   History of colonic polyps 10/23/2008    PCP: Micheal Wolm ORN, MD   REFERRING PROVIDER: Micheal Wolm ORN, MD   REFERRING DIAG:  Diagnosis  M54.50 (ICD-10-CM) - Lumbar pain   Rationale for Evaluation and Treatment: Rehabilitation  THERAPY DIAG:  Pain in left hip  Other low back pain  Difficulty in walking, not elsewhere classified  Muscle tightness  Muscle weakness (generalized)  ONSET DATE: 07/05/2024  SUBJECTIVE:  SUBJECTIVE STATEMENT:  About the same, it still comes and goes. Better overall, but its still there    Eval: Pt reports pain is more central to the hip L side > right than the back; pain rated at 1/10 today. Pt states he did not really keep up with HEP long after previous PT encounter and feels his hip has tightened again. Pt stated current symptoms have not stopped him from being able to perform at work.   PERTINENT HISTORY:  See PMH chart above   PAIN:  Are you having pain? Yes: NPRS scale: 1/10 Pain location: L hip Pain description: Discomfort, tightness Aggravating factors: prolonged sitting  Relieving factors: Rest   PRECAUTIONS: None  RED FLAGS: None   WEIGHT BEARING RESTRICTIONS: No  FALLS:  Has patient fallen in last 6 months? 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   COGNITION: Overall cognitive status: Within  functional limits for tasks assessed     SENSATION: WFL    POSTURE: No Significant postural limitations   LUMBAR ROM:   AROM eval  Flexion To mid shin bilaterally   Extension WNL  Right lateral flexion WNL  Left lateral flexion WNL  Right rotation WNL  Left rotation WNL   (Blank rows = not tested)  LOWER EXTREMITY ROM:     WNL; straight leg hip flexion 60d bilaterally   LOWER EXTREMITY MMT:    MMT Right eval Left eval  Hip flexion 4/5 4/5  Hip extension    Hip abduction 5/5 5/5  Hip adduction 5/5 5/5  Hip internal rotation    Hip external rotation    Knee flexion 5/5 5/5  Knee extension 5/5 5/5  Ankle dorsiflexion 5/5 5/5  Ankle plantarflexion    Ankle inversion    Ankle eversion     (Blank rows = not tested)  LUMBAR SPECIAL TESTS:  Straight leg raise test: Positive  FUNCTIONAL TESTS:  30 seconds chair stand test: 9 STS  GAIT: Distance walked: In Clinic Assistive device utilized: None Level of assistance: Complete Independence Comments: No significant gait deficits noted   TREATMENT DATE:  08/23/24 NuStep L 5 x 6 min 30sc sit to stand 14 reps Airex on 8in vox step ups x 10 each  8in lateral step ups x10 each Shoulder Ext 10lb 2x10 Slant board calf stretch Standing rows 15lb 2x10 Bridges 2x10 Passive Stretching to bilateral HS, piriformis, ITB and K2C  08/16/24 NuStep L 5 x 6 min 30lb resisted side steps x5 lb 6in lateral step ups x10 each S2S holding blue ball 2x10 HS curls 35lb 2x10 Leg Ext 10lb 2x10 HS Stretch  Knee to chest stretch FABER stretch Lumbar rotation stretch (windshield wipers)  08/10/24 NuStep L5 x  Step Ups 6 2x10 Core Cable Rotations 2x10 10# STS w/ yellow weighted ball 4.4# by chest 2x10 Lateral Band Walking BlueTband HS Curls Blue tband 2x10 Hip Adduction Blue ball 2x10 Hip abduction Blue Tband  Calf stretch elevated board 2 x 20s HS Stretch seated and lying Knee to chest stretch FABER stretch Lumbar  rotation stretch (windshield wipers)    08/03/24-Eval  PATIENT EDUCATION:  Education details: HEP Person educated: Patient Education method: Medical Illustrator Education comprehension: verbalized understanding and returned demonstration  HOME EXERCISE PROGRAM: Access Code: [email or web address] URL: [email or web address] Date: 08/03/2024 Prepared by: Almetta Fam  Exercises - Supine Lower Trunk Rotation  - 2 x daily - 7 x weekly - 1 sets - 10 reps - 10 hold - Hooklying Single Knee to Chest Stretch  - 2 x daily - 7 x weekly - 1 sets - 5 reps - 20 hold - Supine Piriformis Stretch Pulling Heel to Hip  - 2 x daily - 7 x weekly - 1 sets - 5 reps - 20 hold - Seated Hamstring Stretch with Chair  - 2 x daily - 7 x weekly - 1 sets - 5 reps - 20 hold - Standing Bilateral Gastroc Stretch with Step  - 2 x daily - 7 x weekly - 1 sets - 5 reps - 20 hold - Modified Thomas Stretch  - 1 x daily - 7 x weekly - 1 sets - 5 reps - 20 hold - Supine Hamstring Stretch with Strap  - 1 x daily - 7 x weekly - 1 sets - 5 reps - 20 hold - Supine Active Straight Leg Raise  - 1 x daily - 7 x weekly - 2 sets - 10 reps - Supine Bridge  - 1 x daily - 7 x weekly - 2 sets - 10 reps  ASSESSMENT:  CLINICAL IMPRESSION: Pt tolerated treatment well with no increase in pain symptoms. He has progressed meeting 30 sec sit to stand goal.  Pt exhibits tightness in HS and piriformis musculature and calves bilaterally.  Again session focused on functional movement patterns and strengthening. Some fatigue with sit to stands. Pt stated he could feel it with step ups.     Patient is a 74 y.o. male who was seen today for physical therapy evaluation and treatment for R hip pain and low back tightness. Pt R hip pain > low back. Pt exhibits tightness in HS musculature bilaterally and presents with  tight hip flexors. Pt will benefit from stretching to mitigate joint stiffness and directed exercises to strengthen posterior chain musculature. Pt still able to perform ADLs and job duties despite symptoms but will benefit from skilled therapy to improve quality of movement, increase functional endurance, and mitigate joint stiffness.   OBJECTIVE IMPAIRMENTS: decreased activity tolerance, decreased endurance, and pain.   ACTIVITY LIMITATIONS: lifting, bending, sitting, and squatting   REHAB POTENTIAL: Good  CLINICAL DECISION MAKING: Stable/uncomplicated  EVALUATION COMPLEXITY: Low   GOALS: Goals reviewed with patient? Yes  SHORT TERM GOALS: Target date: 09/07/24  Patient will be independent with initial HEP.  Baseline:  Goal status: MET doing initial HEP twice a day 08/10/24   LONG TERM GOALS: Target date: 10/12/24  Patient will be independent with advanced/ongoing HEP to improve outcomes and carryover.  Baseline:  Goal status: IN PROGRESS 08/10/24  2.  Patient will demonstrate full pain free lumbar/R hip ROM to perform ADLs.   Baseline:  Goal status: IN PROGRESS pain not completely gone 08/10/24  3.  Patient will demonstrate improved functional strength as demonstrated by 5/5 Hip flexion strength. Baseline:  Goal status: INITIAL  4.  Patient will tolerate 30 min of (standing/sitting/walking) to perform daily activities. Baseline:  Goal status: IN PROGRESS <30 min 08/10/24, Met 08/23/24  5. Patient to demonstrate ability to achieve and maintain good spinal alignment/posturing and body mechanics needed for daily activities. Baseline:  Goal status:  IN PROGRESS requires cues 08/10/24  6.  Pt will perform > 12 STS in a 30s timeframe  Baseline: 9 Goal status: Met 08/23/24  PLAN:  PT FREQUENCY: 1x/week  PT DURATION: 10 weeks  PLANNED INTERVENTIONS: 97110-Therapeutic exercises, 97530- Therapeutic activity, 97112- Neuromuscular re-education, 97535- Self Care, 02859- Manual  therapy, [phone]- Gait training, and Patient/Family education.  PLAN FOR NEXT SESSION: Strengthen posterior chain musculature, stretching HS and low back, general functional mobility activities    Tanda KANDICE Sorrow, PTA 08/23/2024, 11:41 AM  "

## 2024-08-30 ENCOUNTER — Encounter: Payer: Self-pay | Admitting: Physical Therapy

## 2024-08-30 ENCOUNTER — Ambulatory Visit: Admitting: Physical Therapy

## 2024-08-30 DIAGNOSIS — R262 Difficulty in walking, not elsewhere classified: Secondary | ICD-10-CM

## 2024-08-30 DIAGNOSIS — M5459 Other low back pain: Secondary | ICD-10-CM

## 2024-08-30 DIAGNOSIS — M6289 Other specified disorders of muscle: Secondary | ICD-10-CM

## 2024-08-30 DIAGNOSIS — M6281 Muscle weakness (generalized): Secondary | ICD-10-CM

## 2024-08-30 DIAGNOSIS — M25552 Pain in left hip: Secondary | ICD-10-CM

## 2024-08-31 ENCOUNTER — Other Ambulatory Visit: Payer: Self-pay | Admitting: Family Medicine

## 2024-09-01 ENCOUNTER — Telehealth: Payer: Self-pay

## 2024-09-01 NOTE — Telephone Encounter (Signed)
 Copied from CRM [id number]. Topic: Clinical - Medication Question >> Sep 01, 2024  9:04 AM Treva T wrote: Reason for CRM: Pt spouse, Rock, [place] listed, calling to check status of medication refill reust submitted to office by pharmacy.  Medication: lisinopril -hydrochlorothiazide  (ZESTORETIC ) 20-12.5 MG tablet  E-Prescribing Status: Receipt confirmed by pharmacy (09/01/2024  8:10 AM EST)  Sent to: [id number] [place], [place] - [address] [place] [address] Phone: [phone]  Fax: [phone]  Pt spouse advised of above, verbalized understanding, will arrange to be picked up at pharmacy, no further assistance needed at this time.  Sending CRM to CAL for visibility.

## 2024-09-01 NOTE — Telephone Encounter (Signed)
 Patient's wife informed rx was sent

## 2024-09-07 ENCOUNTER — Ambulatory Visit: Admitting: Physical Therapy

## 2024-09-14 ENCOUNTER — Ambulatory Visit: Admitting: Physical Therapy

## 2024-09-21 ENCOUNTER — Ambulatory Visit: Admitting: Physical Therapy

## 2024-10-07 ENCOUNTER — Ambulatory Visit
